# Patient Record
Sex: Female | Born: 1935 | Race: White | Hispanic: No | Marital: Married | Smoking: Never smoker
Health system: Southern US, Community
[De-identification: ages and names within clinical notes are randomized; demographics above are authoritative.]

## PROBLEM LIST (undated history)

## (undated) DIAGNOSIS — I5022 Chronic systolic (congestive) heart failure: Secondary | ICD-10-CM

## (undated) DIAGNOSIS — I5032 Chronic diastolic (congestive) heart failure: Secondary | ICD-10-CM

## (undated) DIAGNOSIS — J811 Chronic pulmonary edema: Secondary | ICD-10-CM

## (undated) DIAGNOSIS — J90 Pleural effusion, not elsewhere classified: Secondary | ICD-10-CM

## (undated) DIAGNOSIS — J849 Interstitial pulmonary disease, unspecified: Secondary | ICD-10-CM

## (undated) DIAGNOSIS — I7300 Raynaud's syndrome without gangrene: Secondary | ICD-10-CM

## (undated) DIAGNOSIS — M1990 Unspecified osteoarthritis, unspecified site: Secondary | ICD-10-CM

## (undated) DIAGNOSIS — J309 Allergic rhinitis, unspecified: Secondary | ICD-10-CM

## (undated) DIAGNOSIS — F0390 Unspecified dementia without behavioral disturbance: Secondary | ICD-10-CM

## (undated) DIAGNOSIS — K7460 Unspecified cirrhosis of liver: Secondary | ICD-10-CM

## (undated) DIAGNOSIS — I10 Essential (primary) hypertension: Secondary | ICD-10-CM

## (undated) DIAGNOSIS — I4891 Unspecified atrial fibrillation: Secondary | ICD-10-CM

## (undated) DIAGNOSIS — K922 Gastrointestinal hemorrhage, unspecified: Secondary | ICD-10-CM

## (undated) DIAGNOSIS — J9600 Acute respiratory failure, unspecified whether with hypoxia or hypercapnia: Secondary | ICD-10-CM

## (undated) DIAGNOSIS — K219 Gastro-esophageal reflux disease without esophagitis: Secondary | ICD-10-CM

## (undated) HISTORY — DX: Chronic systolic (congestive) heart failure: I50.22

## (undated) HISTORY — DX: Pleural effusion, not elsewhere classified: J90

## (undated) HISTORY — DX: Raynaud's syndrome without gangrene: I73.00

## (undated) HISTORY — DX: Interstitial pulmonary disease, unspecified: J84.9

## (undated) HISTORY — DX: Acute respiratory failure, unspecified whether with hypoxia or hypercapnia: J96.00

## (undated) HISTORY — DX: Essential (primary) hypertension: I10

## (undated) HISTORY — DX: Chronic pulmonary edema: J81.1

## (undated) HISTORY — DX: Gastro-esophageal reflux disease without esophagitis: K21.9

## (undated) HISTORY — DX: Chronic diastolic (congestive) heart failure: I50.32

## (undated) HISTORY — DX: Gastrointestinal hemorrhage, unspecified: K92.2

## (undated) HISTORY — DX: Unspecified osteoarthritis, unspecified site: M19.90

## (undated) HISTORY — DX: Allergic rhinitis, unspecified: J30.9

## (undated) HISTORY — PX: ABDOMINAL HYSTERECTOMY: SHX81

## (undated) HISTORY — DX: Unspecified atrial fibrillation: I48.91

## (undated) SURGERY — EGD (ESOPHAGOGASTRODUODENOSCOPY)
Anesthesia: General | Laterality: Left

---

## 2008-02-18 ENCOUNTER — Encounter: Admission: RE | Admit: 2008-02-18 | Discharge: 2008-02-18 | Payer: Self-pay | Admitting: Internal Medicine

## 2008-02-19 ENCOUNTER — Emergency Department (HOSPITAL_COMMUNITY): Admission: EM | Admit: 2008-02-19 | Discharge: 2008-02-19 | Payer: Self-pay | Admitting: Emergency Medicine

## 2008-02-20 ENCOUNTER — Ambulatory Visit: Payer: Self-pay | Admitting: Surgery

## 2008-02-20 ENCOUNTER — Encounter (INDEPENDENT_AMBULATORY_CARE_PROVIDER_SITE_OTHER): Payer: Self-pay | Admitting: Emergency Medicine

## 2008-02-20 ENCOUNTER — Ambulatory Visit (HOSPITAL_COMMUNITY): Admission: RE | Admit: 2008-02-20 | Discharge: 2008-02-20 | Payer: Self-pay | Admitting: Emergency Medicine

## 2008-05-14 ENCOUNTER — Encounter: Admission: RE | Admit: 2008-05-14 | Discharge: 2008-05-14 | Payer: Self-pay | Admitting: Orthopaedic Surgery

## 2008-06-08 ENCOUNTER — Encounter: Admission: RE | Admit: 2008-06-08 | Discharge: 2008-06-08 | Payer: Self-pay | Admitting: Orthopaedic Surgery

## 2008-08-12 ENCOUNTER — Encounter: Admission: RE | Admit: 2008-08-12 | Discharge: 2008-08-12 | Payer: Self-pay | Admitting: Orthopaedic Surgery

## 2008-11-17 ENCOUNTER — Encounter: Admission: RE | Admit: 2008-11-17 | Discharge: 2008-11-17 | Payer: Self-pay | Admitting: Orthopaedic Surgery

## 2009-02-27 ENCOUNTER — Emergency Department (HOSPITAL_COMMUNITY): Admission: EM | Admit: 2009-02-27 | Discharge: 2009-02-27 | Payer: Self-pay | Admitting: Emergency Medicine

## 2010-02-09 ENCOUNTER — Encounter: Admission: RE | Admit: 2010-02-09 | Discharge: 2010-02-09 | Payer: Self-pay | Admitting: Internal Medicine

## 2010-02-27 ENCOUNTER — Encounter: Admission: RE | Admit: 2010-02-27 | Discharge: 2010-02-27 | Payer: Self-pay | Admitting: Internal Medicine

## 2010-08-31 ENCOUNTER — Encounter: Admission: RE | Admit: 2010-08-31 | Discharge: 2010-08-31 | Payer: Self-pay | Admitting: Internal Medicine

## 2011-02-12 ENCOUNTER — Other Ambulatory Visit: Payer: Self-pay | Admitting: Internal Medicine

## 2011-04-03 ENCOUNTER — Other Ambulatory Visit: Payer: Self-pay | Admitting: Internal Medicine

## 2011-04-03 DIAGNOSIS — Z09 Encounter for follow-up examination after completed treatment for conditions other than malignant neoplasm: Secondary | ICD-10-CM

## 2011-07-23 ENCOUNTER — Encounter (INDEPENDENT_AMBULATORY_CARE_PROVIDER_SITE_OTHER): Payer: Medicare PPO | Admitting: Ophthalmology

## 2011-07-23 DIAGNOSIS — H3530 Unspecified macular degeneration: Secondary | ICD-10-CM

## 2011-07-23 DIAGNOSIS — H43819 Vitreous degeneration, unspecified eye: Secondary | ICD-10-CM

## 2011-07-23 DIAGNOSIS — H2510 Age-related nuclear cataract, unspecified eye: Secondary | ICD-10-CM

## 2011-08-21 ENCOUNTER — Other Ambulatory Visit: Payer: Self-pay | Admitting: Neurosurgery

## 2011-08-21 DIAGNOSIS — M545 Low back pain: Secondary | ICD-10-CM

## 2011-08-22 ENCOUNTER — Ambulatory Visit
Admission: RE | Admit: 2011-08-22 | Discharge: 2011-08-22 | Disposition: A | Discharge status: Home / Self Care | Payer: Medicare PPO | Source: Ambulatory Visit | Attending: Neurosurgery | Admitting: Neurosurgery

## 2011-08-22 DIAGNOSIS — M545 Low back pain: Secondary | ICD-10-CM

## 2011-09-12 ENCOUNTER — Other Ambulatory Visit (HOSPITAL_COMMUNITY): Payer: Self-pay | Admitting: Neurosurgery

## 2011-09-12 ENCOUNTER — Encounter (HOSPITAL_COMMUNITY)
Admission: RE | Admit: 2011-09-12 | Discharge: 2011-09-12 | Disposition: A | Discharge status: Home / Self Care | Payer: Medicare PPO | Source: Ambulatory Visit | Attending: Neurosurgery | Admitting: Neurosurgery

## 2011-09-12 DIAGNOSIS — M47816 Spondylosis without myelopathy or radiculopathy, lumbar region: Secondary | ICD-10-CM

## 2011-09-12 LAB — DIFFERENTIAL
Eosinophils Relative: 3 % (ref 0–5)
Lymphocytes Relative: 28 % (ref 12–46)
Lymphs Abs: 2.4 10*3/uL (ref 0.7–4.0)
Monocytes Absolute: 0.7 10*3/uL (ref 0.1–1.0)

## 2011-09-12 LAB — CBC
HCT: 40.8 % (ref 36.0–46.0)
MCV: 86.1 fL (ref 78.0–100.0)
RDW: 13.4 % (ref 11.5–15.5)
WBC: 8.5 10*3/uL (ref 4.0–10.5)

## 2011-09-12 LAB — ABO/RH: ABO/RH(D): B NEG

## 2011-09-12 LAB — TYPE AND SCREEN

## 2011-09-25 ENCOUNTER — Inpatient Hospital Stay (HOSPITAL_COMMUNITY)
Admission: RE | Admit: 2011-09-25 | Discharge: 2011-09-27 | DRG: 455 | Disposition: A | Discharge status: Home / Self Care | Payer: Medicare PPO | Source: Ambulatory Visit | Attending: Neurosurgery | Admitting: Neurosurgery

## 2011-09-25 ENCOUNTER — Inpatient Hospital Stay (HOSPITAL_COMMUNITY): Payer: Medicare PPO

## 2011-09-25 DIAGNOSIS — M4310 Spondylolisthesis, site unspecified: Secondary | ICD-10-CM | POA: Diagnosis present

## 2011-09-25 DIAGNOSIS — M47817 Spondylosis without myelopathy or radiculopathy, lumbosacral region: Principal | ICD-10-CM | POA: Diagnosis present

## 2011-09-25 DIAGNOSIS — I10 Essential (primary) hypertension: Secondary | ICD-10-CM | POA: Diagnosis present

## 2011-09-25 DIAGNOSIS — Z01812 Encounter for preprocedural laboratory examination: Secondary | ICD-10-CM

## 2011-09-25 DIAGNOSIS — Z01810 Encounter for preprocedural cardiovascular examination: Secondary | ICD-10-CM

## 2011-09-25 DIAGNOSIS — Z01818 Encounter for other preprocedural examination: Secondary | ICD-10-CM

## 2011-09-25 HISTORY — PX: BACK SURGERY: SHX140

## 2011-09-25 LAB — BASIC METABOLIC PANEL
CO2: 27 mEq/L (ref 19–32)
Calcium: 9.8 mg/dL (ref 8.4–10.5)
GFR calc Af Amer: 75 mL/min — ABNORMAL LOW (ref >90)
GFR calc non Af Amer: 64 mL/min — ABNORMAL LOW (ref >90)
Sodium: 140 mEq/L (ref 135–145)

## 2011-10-02 NOTE — Op Note (Signed)
NAMEEUTHA, CUDE NO.:  [id number]  MEDICAL RECORD NO.:  [id number]  LOCATION:  3004                         FACILITY:  [facility]  PHYSICIAN:  Kathaleen Maser. Erina Hamme, M.D.    DATE OF BIRTH:  06-04-1929  DATE OF PROCEDURE:  09/25/2011 DATE OF DISCHARGE:                              OPERATIVE REPORT   PREOPERATIVE DIAGNOSES:  L2-3 spondylosis with stenosis and instability. L4-5 grade 1 degenerative spondylolisthesis with severe stenosis.  POSTOPERATIVE DIAGNOSES:  L2-3 spondylosis with stenosis and instability.  L4-5 grade 1 degenerative spondylolisthesis with severe stenosis.  PROCEDURE:  Left L2-3 anterolateral retroperitoneal interbody decompression and fusion utilizing PEEK cage and morselized autograft. L2-3 posterior lumbar fusion with interspinous plate fixation.  L4-5 decompressive laminectomy with bilateral L4 and L5 decompressive foraminotomies, more than be required for simple interbody fusion alone. L4-5 posterior lumbar fusion with tangent interbody allograft wedge, Telamon interbody PEEK cage, and local autografting.  L4-5 posterolateral arthrodesis utilizing nonsegmental pedicle instrumentation and local autografting.  SURGEON:  Kathaleen Maser. Jaymar Loeber, MD  ASSISTANT:  Reinaldo Meeker, MD  ANESTHESIA:  General endotracheal.  INDICATION:  Ms. Lebo is a 77 year old female with history of severe back and lower extremity pain.  Her workup demonstrates evidence of critical stenosis with a grade 1 degenerative spondylolisthesis at L4-5. The patient has progressive disk space breakdown, lateral listhesis, and stenosis at L2-3.  The patient has symptoms roughly to both areas.  She presents now for decompression and fusion at both levels.  OPERATIVE NOTE:  The patient was taken to the operating room and placed on the operating table in supine position.  After adequate level of anesthesia was achieved, the patient was placed in the right lateral decubitus  position and appropriately padded.  The patient's left flank was prepped and draped sterilely.  A linear skin incision was made overlying the L2-3 disk space.  A secondary incision was made posterior and inferior to this.  Using the posterior incision, access was gained to the retroperitoneal space.  Peritoneal contents were swept anteriorly and a dilator was then passed through the flank incision docking down on the L2-3 disk space.  Intraoperative neuro-monitoring was performed, and the approach was free from any adjacent neural structures.  Guidewire was placed, and the track was sequentially dilated.  Self-retaining retractor was placed.  Fluoroscopy confirmed good positioning.  A shim was inserted in the posterior aspect of the disk space.  The operative field was stimulated one last time and found to be free from any neural structures.  Disk space was incised with a 15 blade in a rectangular fashion.  An aggressive diskectomy was then performed using pituitary rongeurs and various curettes.  Contralateral release was performed using Cobb periosteal.  The disk space was sequentially dilated with a trial and a 10-mm lordotic implant was found to be most appropriate for the L2-3 level.  This was packed with Actifuse putty and Osteocel Plus. Protective paddles were placed, and the cage was then impacted into place under fluoroscopic guidance.  This was well-seated in both the anterior and lateral planes.  The inserters were removed.  Once again, fluoroscopy confirmed good positioning of the implant.  Retraction system was removed.  Hemostasis was found to be excellent upon withdrawal of the retractor.  The patient was then repositioned prone. The patient's posterior lumbar region was prepped and draped sterilely. A 10 blade was used to make a linear skin incision extending from L2 down to L5.  Subperiosteal dissection was then performed to expose the lamina facet joints of L2, L3, L4,  and L5 as well as the transverse processes of L4-5.  The interspinous ligament at L2-3 was removed. Posterior lamina and facet joints were decorticated.  Morselized autograft was packed posterolaterally for later fusion.  An interspinous plate from [employer] was then placed over the L2-3 level.  This was then attached and confirmed to be in good position by intraoperative fluoroscopy.  Attention then placed to the L4-5 level.  Decompressive laminectomy was then performed using Leksell rongeurs, Kerrison rongeurs, and high-speed drill to remove the entire lamina of L4, inferior facets of L4, superior facets of L5, and the superior aspect of the L5 lamina.  Ligamentum flavum was then elevated and resected in piecemeal fashion using Kerrison rongeurs.  The underlying thecal sac and exiting L4-L5 nerve roots were identified and widely decompressed bilaterally.  Bilateral diskectomies were then performed at L4-5.  Disk space then sequentially dilated up to 10 mm.  With 10-mm distractor left in the patient's right side,  thecal sac and nerve roots inspected on the left side.  Disk space was then reamed and then cut with 10-mm tangent instrument and soft tissue removed from the interspace.  A 10 x 26 mm tangent wedge was then packed into place and recessed approximately 2 mm from posterior cortical margin.  Distractor was moved to the patient's right side.  Thecal sac and nerve roots were inspected on the right side.  Disk space was again reamed and then cut with 10-mm tangent instrument with soft tissue removed from the interspace. Morselized autograft was packed in interspace and then a 10 x 22 mm Telamon cage packed with morselized autograft was then packed into place and recessed approximately 2 mm from the posterior cortical margin. Pedicles at L4-L5 were then identified using surface landmarks and intraoperative fluoroscopy.  Superficial bone around the pedicle was then removed using  high-speed drill.  Each pedicle was then probed using pedicle awl.  Each pedicle awl track was then tapped with 5.2 mm screw tapper.  Each screw tapper hole was then probed and found to be solidly within bone.  A 5.75 x 45 mm radius screws placed bilaterally at L4, 5.75 x 40 mm screws placed bilaterally at L5.  The spinous processes of L4-L5 were then decorticated using high-speed drill.  This was morselized with autograft, packed posterolaterally for later fusion. Short segment titanium rod placed over the screw heads at L4-L5. Locking caps were placed over screw heads and locking caps were then engaged with good construct under compression.  Final images revealed good position of the bone grafts, hardware at proper operative level and normal alignment of the spine.  Wound was then irrigated with antibiotic solution.  Gelfoam was placed topically for hemostasis and found to be good.  Medium Hemovac drain was left in the epidural space.  Wound was then closed in layers with Vicryl suture.  Steri-Strips and sterile dressings were applied.  There were no operative complications.  The patient tolerated the procedure well, and she returned to the recovery room postoperatively.          ______________________________ Kathaleen Maser Danyele Smejkal, M.D. HAP/MEDQ  D:  09/25/2011  T:  09/26/2011  Job:  [id number]  Electronically Signed by Julio Sicks M.D. on 10/02/2011 12:58:09 PM

## 2011-10-25 ENCOUNTER — Other Ambulatory Visit: Payer: Self-pay | Admitting: Neurosurgery

## 2011-10-25 ENCOUNTER — Ambulatory Visit
Admission: RE | Admit: 2011-10-25 | Discharge: 2011-10-25 | Disposition: A | Discharge status: Home / Self Care | Payer: Medicare PPO | Source: Ambulatory Visit | Attending: Neurosurgery | Admitting: Neurosurgery

## 2011-10-25 DIAGNOSIS — M545 Low back pain: Secondary | ICD-10-CM

## 2011-12-27 ENCOUNTER — Ambulatory Visit
Admission: RE | Admit: 2011-12-27 | Discharge: 2011-12-27 | Disposition: A | Discharge status: Home / Self Care | Payer: Medicare PPO | Source: Ambulatory Visit | Attending: Neurosurgery | Admitting: Neurosurgery

## 2011-12-27 ENCOUNTER — Other Ambulatory Visit: Payer: Self-pay | Admitting: Neurosurgery

## 2011-12-27 DIAGNOSIS — M48061 Spinal stenosis, lumbar region without neurogenic claudication: Secondary | ICD-10-CM

## 2011-12-27 DIAGNOSIS — M4310 Spondylolisthesis, site unspecified: Secondary | ICD-10-CM

## 2012-01-21 ENCOUNTER — Ambulatory Visit (INDEPENDENT_AMBULATORY_CARE_PROVIDER_SITE_OTHER): Payer: Medicare PPO | Admitting: Ophthalmology

## 2012-01-21 DIAGNOSIS — H2510 Age-related nuclear cataract, unspecified eye: Secondary | ICD-10-CM

## 2012-01-21 DIAGNOSIS — I10 Essential (primary) hypertension: Secondary | ICD-10-CM

## 2012-01-21 DIAGNOSIS — H3530 Unspecified macular degeneration: Secondary | ICD-10-CM

## 2012-01-21 DIAGNOSIS — H43819 Vitreous degeneration, unspecified eye: Secondary | ICD-10-CM

## 2012-01-21 DIAGNOSIS — H35379 Puckering of macula, unspecified eye: Secondary | ICD-10-CM

## 2012-01-21 DIAGNOSIS — H35039 Hypertensive retinopathy, unspecified eye: Secondary | ICD-10-CM

## 2012-01-29 ENCOUNTER — Encounter: Payer: Self-pay | Admitting: Family

## 2012-02-06 ENCOUNTER — Encounter: Payer: Self-pay | Admitting: Family

## 2012-02-06 ENCOUNTER — Ambulatory Visit (INDEPENDENT_AMBULATORY_CARE_PROVIDER_SITE_OTHER): Payer: Medicare PPO | Admitting: Family

## 2012-02-06 VITALS — BP 120/70 | Ht 64.5 in | Wt 151.0 lb

## 2012-02-06 DIAGNOSIS — F411 Generalized anxiety disorder: Secondary | ICD-10-CM

## 2012-02-06 DIAGNOSIS — K219 Gastro-esophageal reflux disease without esophagitis: Secondary | ICD-10-CM

## 2012-02-06 DIAGNOSIS — E785 Hyperlipidemia, unspecified: Secondary | ICD-10-CM

## 2012-02-06 DIAGNOSIS — I10 Essential (primary) hypertension: Secondary | ICD-10-CM

## 2012-02-06 DIAGNOSIS — F419 Anxiety disorder, unspecified: Secondary | ICD-10-CM

## 2012-02-06 LAB — LIPID PANEL
HDL: 51.7 mg/dL (ref >39.00)
LDL Cholesterol: 60 mg/dL (ref 0–99)
Total CHOL/HDL Ratio: 3
Triglycerides: 101 mg/dL (ref 0.0–149.0)
VLDL: 20.2 mg/dL (ref 0.0–40.0)

## 2012-02-06 LAB — TSH: TSH: 4.11 u[IU]/mL (ref 0.35–5.50)

## 2012-02-06 LAB — BASIC METABOLIC PANEL
CO2: 28 mEq/L (ref 19–32)
Calcium: 9.1 mg/dL (ref 8.4–10.5)
Sodium: 140 mEq/L (ref 135–145)

## 2012-02-06 LAB — CBC WITH DIFFERENTIAL/PLATELET
Basophils Relative: 0.7 % (ref 0.0–3.0)
Eosinophils Relative: 2.5 % (ref 0.0–5.0)
Hemoglobin: 10.8 g/dL — ABNORMAL LOW (ref 12.0–15.0)
Lymphocytes Relative: 28.7 % (ref 12.0–46.0)
MCHC: 32.1 g/dL (ref 30.0–36.0)
Monocytes Relative: 8.3 % (ref 3.0–12.0)
Neutro Abs: 4.5 10*3/uL (ref 1.4–7.7)
RBC: 4.39 Mil/uL (ref 3.87–5.11)
WBC: 7.5 10*3/uL (ref 4.5–10.5)

## 2012-02-06 NOTE — Patient Instructions (Signed)

## 2012-02-06 NOTE — Progress Notes (Signed)
  Subjective:    Patient ID: Tiffany [name], female    DOB: 1929/08/23, 78 y.o.   MRN: [id number]  HPI 78 year old white female, nonsmoker, new patient to the practice and to be established. She has a past medical history of GERD, hypertension, anxiety and hyperlipidemia. She is stable on her current medications. Doesn't routinely exercise or follow any particular diet. She has declined mammograms and colonoscopies screenings.  Patient does report a past medical history of osteoarthritis in her hands bilaterally characterized by periodic episodes of aching. She does not take anything for her symptoms. And declines any medication therapy.   Review of Systems  Constitutional: Negative.   HENT: Negative.   Eyes: Negative.   Respiratory: Negative.   Cardiovascular: Negative.   Gastrointestinal: Negative.   Genitourinary: Negative.   Musculoskeletal: Negative.   Skin: Negative.   Neurological: Negative.   Hematological: Negative.   Psychiatric/Behavioral: Negative.    Past Medical History  Diagnosis Date  . Hypertension     History   Social History  . Marital Status: Married    Spouse Name: N/A    Number of Children: N/A  . Years of Education: N/A   Occupational History  . Not on file.   Social History Main Topics  . Smoking status: Never Smoker   . Smokeless tobacco: Not on file  . Alcohol Use: Yes     glass of wine per day  . Drug Use: No  . Sexually Active: Not on file   Other Topics Concern  . Not on file   Social History Narrative  . No narrative on file    Past Surgical History  Procedure Date  . Back surgery 09/25/2011  . Abdominal hysterectomy     No family history on file.  No Known Allergies  No current outpatient prescriptions on file prior to visit.    BP 120/70  Ht 5' 4.5" (1.638 m)  Wt 151 lb (68.493 kg)  BMI 25.52 kg/m2    Objective:   Physical Exam  Constitutional: She is oriented to person, place, and time. She appears  well-developed and well-nourished.  HENT:  Right Ear: External ear normal.  Nose: Nose normal.  Mouth/Throat: Oropharynx is clear and moist.  Eyes: Pupils are equal, round, and reactive to light.  Neck: Normal range of motion. Neck supple.  Cardiovascular: Normal rate, regular rhythm and normal heart sounds.   Pulmonary/Chest: Effort normal and breath sounds normal.  Abdominal: Soft. Bowel sounds are normal.  Musculoskeletal: Normal range of motion.  Neurological: She is alert and oriented to person, place, and time.  Skin: Skin is warm and dry.  Psychiatric: She has a normal mood and affect.          Assessment & Plan:  Assessment: Osteoarthritis, GERD, hypertension, anxiety, and hyperlipidemia  Plan: Labs since include BMP, CBC, TSH and lipids. Will notify patient of the results herself a diet and exercise. Self breast exams. Exercise 30 minutes daily. We'll bring patient back for recheck in about 6 months. Will recheck patient sooner when necessary

## 2012-02-07 ENCOUNTER — Telehealth: Payer: Self-pay | Admitting: Family

## 2012-02-07 NOTE — Telephone Encounter (Signed)
ALPRAZolam (XANAX) 0.5 MG tablet , hydrochlorothiazide (HYDRODIURIL) 12.5 MG ,lisinopril (PRINIVIL,ZESTRIL) 20 MG tablet, omeprazole (PRILOSEC) 20 MG capsule, pravastatin (PRAVACHOL) 20 MG tablet to [facility].

## 2012-02-08 MED ORDER — HYDROCHLOROTHIAZIDE 12.5 MG PO TABS: 12.5000 mg | ORAL_TABLET | Freq: Every day | ORAL | Status: DC

## 2012-02-08 MED ORDER — OMEPRAZOLE 20 MG PO CPDR: 20.0000 mg | DELAYED_RELEASE_CAPSULE | Freq: Every day | ORAL | Status: DC

## 2012-02-08 MED ORDER — PRAVASTATIN SODIUM 20 MG PO TABS: 20.0000 mg | ORAL_TABLET | Freq: Every day | ORAL | Status: DC

## 2012-02-08 MED ORDER — LISINOPRIL 20 MG PO TABS: 20.0000 mg | ORAL_TABLET | Freq: Every day | ORAL | Status: DC

## 2012-02-08 MED ORDER — ALPRAZOLAM 0.5 MG PO TABS: 0.5000 mg | ORAL_TABLET | Freq: Every day | ORAL | Status: DC

## 2012-02-08 NOTE — Telephone Encounter (Signed)
Rxs faxed to [facility] at [phone]

## 2012-04-16 ENCOUNTER — Other Ambulatory Visit: Payer: Self-pay | Admitting: Family

## 2012-04-16 MED ORDER — ALPRAZOLAM 0.5 MG PO TABS: 0.5000 mg | ORAL_TABLET | Freq: Every day | ORAL | Status: DC

## 2012-07-21 ENCOUNTER — Ambulatory Visit (INDEPENDENT_AMBULATORY_CARE_PROVIDER_SITE_OTHER): Payer: Medicare PPO | Admitting: Ophthalmology

## 2012-08-06 ENCOUNTER — Ambulatory Visit (INDEPENDENT_AMBULATORY_CARE_PROVIDER_SITE_OTHER): Payer: Medicare PPO | Admitting: Family

## 2012-08-06 ENCOUNTER — Other Ambulatory Visit: Payer: Self-pay | Admitting: Family

## 2012-08-06 ENCOUNTER — Encounter: Payer: Self-pay | Admitting: Family

## 2012-08-06 VITALS — BP 120/82 | HR 91 | Temp 97.9°F | Wt 146.0 lb

## 2012-08-06 DIAGNOSIS — E785 Hyperlipidemia, unspecified: Secondary | ICD-10-CM

## 2012-08-06 DIAGNOSIS — I10 Essential (primary) hypertension: Secondary | ICD-10-CM

## 2012-08-06 DIAGNOSIS — R413 Other amnesia: Secondary | ICD-10-CM

## 2012-08-06 DIAGNOSIS — F419 Anxiety disorder, unspecified: Secondary | ICD-10-CM

## 2012-08-06 DIAGNOSIS — F411 Generalized anxiety disorder: Secondary | ICD-10-CM

## 2012-08-06 DIAGNOSIS — J309 Allergic rhinitis, unspecified: Secondary | ICD-10-CM

## 2012-08-06 DIAGNOSIS — E875 Hyperkalemia: Secondary | ICD-10-CM

## 2012-08-06 LAB — BASIC METABOLIC PANEL
BUN: 26 mg/dL — ABNORMAL HIGH (ref 6–23)
CO2: 27 mEq/L (ref 19–32)
Calcium: 9.4 mg/dL (ref 8.4–10.5)
Creatinine, Ser: 1.1 mg/dL (ref 0.4–1.2)

## 2012-08-06 LAB — CBC WITH DIFFERENTIAL/PLATELET
Basophils Relative: 0.5 % (ref 0.0–3.0)
Eosinophils Absolute: 0.1 10*3/uL (ref 0.0–0.7)
Lymphocytes Relative: 24.4 % (ref 12.0–46.0)
MCHC: 31.5 g/dL (ref 30.0–36.0)
MCV: 80.9 fl (ref 78.0–100.0)
Monocytes Absolute: 0.6 10*3/uL (ref 0.1–1.0)
Neutrophils Relative %: 64.5 % (ref 43.0–77.0)
Platelets: 104 10*3/uL — ABNORMAL LOW (ref 150.0–400.0)
RBC: 4.55 Mil/uL (ref 3.87–5.11)
WBC: 6.4 10*3/uL (ref 4.5–10.5)

## 2012-08-06 LAB — VITAMIN B12: Vitamin B-12: 318 pg/mL (ref 211–911)

## 2012-08-06 LAB — LIPID PANEL
LDL Cholesterol: 53 mg/dL (ref 0–99)
VLDL: 24 mg/dL (ref 0.0–40.0)

## 2012-08-06 LAB — TSH: TSH: 3.89 u[IU]/mL (ref 0.35–5.50)

## 2012-08-06 MED ORDER — HYDROCHLOROTHIAZIDE 12.5 MG PO TABS: 12.5000 mg | ORAL_TABLET | Freq: Every day | ORAL | Status: DC

## 2012-08-06 MED ORDER — LORATADINE 10 MG PO TABS: 10.0000 mg | ORAL_TABLET | Freq: Every day | ORAL | Status: DC

## 2012-08-06 MED ORDER — OMEPRAZOLE 20 MG PO CPDR: 20.0000 mg | DELAYED_RELEASE_CAPSULE | Freq: Every day | ORAL | Status: DC

## 2012-08-06 MED ORDER — MELOXICAM 7.5 MG PO TABS: 7.5000 mg | ORAL_TABLET | Freq: Every day | ORAL | Status: DC

## 2012-08-06 MED ORDER — LISINOPRIL 20 MG PO TABS: 20.0000 mg | ORAL_TABLET | Freq: Every day | ORAL | Status: DC

## 2012-08-06 MED ORDER — PRAVASTATIN SODIUM 20 MG PO TABS: 20.0000 mg | ORAL_TABLET | Freq: Every day | ORAL | Status: DC

## 2012-08-06 MED ORDER — ALPRAZOLAM 0.5 MG PO TABS: 0.5000 mg | ORAL_TABLET | Freq: Every day | ORAL | Status: DC

## 2012-08-06 NOTE — Progress Notes (Signed)
Subjective:    Patient ID: Tiffany [name], female    DOB: [birth year], 78 y.o.   MRN: [id number]  HPI 78 year old female in for a recheck of Hypertension, Anxiety, GERD, and Hyperlipidemia. She is currently stable on all of there medications. Has concerns of arthritis worsening in her feet that she occasionally take Ibuprofen. Pain is a 3/10 that is worse in the mornings. Described as achy.   Review of Systems  Constitutional: Negative.   HENT: Negative.   Eyes: Negative.   Respiratory: Negative.   Cardiovascular: Negative.   Gastrointestinal: Negative.   Genitourinary: Negative.   Musculoskeletal: Negative.   Neurological: Negative.   Hematological: Negative.   Psychiatric/Behavioral: Negative.    Past Medical History  Diagnosis Date  . Hypertension     History   Social History  . Marital Status: Married    Spouse Name: N/A    Number of Children: N/A  . Years of Education: N/A   Occupational History  . Not on file.   Social History Main Topics  . Smoking status: Never Smoker   . Smokeless tobacco: Not on file  . Alcohol Use: Yes     glass of wine per day  . Drug Use: No  . Sexually Active: Not on file   Other Topics Concern  . Not on file   Social History Narrative  . No narrative on file    Past Surgical History  Procedure Date  . Back surgery 09/25/2011  . Abdominal hysterectomy     No family history on file.  No Known Allergies  Current Outpatient Prescriptions on File Prior to Visit  Medication Sig Dispense Refill  . aspirin 81 MG tablet Take 81 mg by mouth daily.      . benzonatate (TESSALON) 200 MG capsule       . CRANBERRY PO Take 3,600 mg by mouth daily.      Marland Kitchen ipratropium (ATROVENT) 0.03 % nasal spray       . DISCONTD: hydrochlorothiazide (HYDRODIURIL) 12.5 MG tablet Take 1 tablet (12.5 mg total) by mouth daily.  90 tablet  1  . DISCONTD: lisinopril (PRINIVIL,ZESTRIL) 20 MG tablet Take 1 tablet (20 mg total) by mouth daily.  90 tablet   1  . DISCONTD: loratadine (CLARITIN) 10 MG tablet Take 10 mg by mouth daily.      Marland Kitchen DISCONTD: omeprazole (PRILOSEC) 20 MG capsule Take 1 capsule (20 mg total) by mouth daily.  90 capsule  1  . DISCONTD: pravastatin (PRAVACHOL) 20 MG tablet Take 1 tablet (20 mg total) by mouth daily.  90 tablet  1    BP 120/82  Pulse 91  Temp 97.9 F (36.6 C) (Oral)  Wt 146 lb (66.225 kg)  SpO2 95%chart    Objective:   Physical Exam  Constitutional: She is oriented to person, place, and time. She appears well-developed and well-nourished.  HENT:  Right Ear: External ear normal.  Left Ear: External ear normal.  Nose: Nose normal.  Mouth/Throat: Oropharynx is clear and moist.  Neck: Normal range of motion. Neck supple.  Cardiovascular: Normal rate, regular rhythm and normal heart sounds.   Pulmonary/Chest: Effort normal and breath sounds normal.  Abdominal: Soft. Bowel sounds are normal.  Musculoskeletal: Normal range of motion.  Neurological: She is alert and oriented to person, place, and time.  Skin: Skin is warm and dry.  Psychiatric: She has a normal mood and affect.          Assessment & Plan:  Assessment: Hypertension, Anxiety, GERD, Hyperlipidemia, Osteorthritis  Plan: Labs sent. Encouraged her to continue healthy diet and exercise. Prescriptions renewed. Call the office with any questions or concerns. Recheck in 4-6 months and sooner as needed.

## 2012-08-07 NOTE — Progress Notes (Signed)
Quick Note:  Called and spoke with pt about lab results. ______ 

## 2012-08-20 ENCOUNTER — Ambulatory Visit (INDEPENDENT_AMBULATORY_CARE_PROVIDER_SITE_OTHER): Payer: Medicare PPO | Admitting: Family

## 2012-08-20 DIAGNOSIS — Z23 Encounter for immunization: Secondary | ICD-10-CM

## 2012-08-28 ENCOUNTER — Other Ambulatory Visit: Payer: Medicare PPO

## 2012-08-28 DIAGNOSIS — E875 Hyperkalemia: Secondary | ICD-10-CM

## 2012-08-28 LAB — BASIC METABOLIC PANEL
GFR: 45.84 mL/min — ABNORMAL LOW (ref >60.00)
Potassium: 5.3 mEq/L — ABNORMAL HIGH (ref 3.5–5.1)
Sodium: 139 mEq/L (ref 135–145)

## 2012-09-01 ENCOUNTER — Telehealth: Payer: Self-pay | Admitting: Family

## 2012-09-01 NOTE — Telephone Encounter (Signed)
Pt called and lft a vm req that she get lab results re: enzyme lvls. Req call back from nurse.

## 2012-09-01 NOTE — Telephone Encounter (Signed)
Left message to explain what stable labs mean and advised pt to call back with questions or concerns

## 2012-12-29 ENCOUNTER — Other Ambulatory Visit: Payer: Self-pay | Admitting: Family

## 2013-01-19 ENCOUNTER — Encounter: Payer: Self-pay | Admitting: Family

## 2013-01-19 ENCOUNTER — Ambulatory Visit (INDEPENDENT_AMBULATORY_CARE_PROVIDER_SITE_OTHER): Payer: Medicare PPO | Admitting: Family

## 2013-01-19 ENCOUNTER — Telehealth: Payer: Self-pay | Admitting: Family

## 2013-01-19 VITALS — BP 120/72 | HR 88 | Wt 150.4 lb

## 2013-01-19 DIAGNOSIS — E785 Hyperlipidemia, unspecified: Secondary | ICD-10-CM | POA: Insufficient documentation

## 2013-01-19 DIAGNOSIS — I7300 Raynaud's syndrome without gangrene: Secondary | ICD-10-CM

## 2013-01-19 DIAGNOSIS — M1710 Unilateral primary osteoarthritis, unspecified knee: Secondary | ICD-10-CM | POA: Insufficient documentation

## 2013-01-19 DIAGNOSIS — I10 Essential (primary) hypertension: Secondary | ICD-10-CM

## 2013-01-19 DIAGNOSIS — F411 Generalized anxiety disorder: Secondary | ICD-10-CM | POA: Insufficient documentation

## 2013-01-19 DIAGNOSIS — J309 Allergic rhinitis, unspecified: Secondary | ICD-10-CM | POA: Insufficient documentation

## 2013-01-19 LAB — LIPID PANEL
HDL: 46.1 mg/dL (ref >39.00)
LDL Cholesterol: 106 mg/dL — ABNORMAL HIGH (ref 0–99)
Total CHOL/HDL Ratio: 4
Triglycerides: 151 mg/dL — ABNORMAL HIGH (ref 0.0–149.0)

## 2013-01-19 LAB — CBC WITH DIFFERENTIAL/PLATELET
Eosinophils Relative: 2.9 % (ref 0.0–5.0)
HCT: 35.8 % — ABNORMAL LOW (ref 36.0–46.0)
Hemoglobin: 11.5 g/dL — ABNORMAL LOW (ref 12.0–15.0)
Lymphocytes Relative: 28.1 % (ref 12.0–46.0)
Lymphs Abs: 1.9 10*3/uL (ref 0.7–4.0)
Monocytes Relative: 8 % (ref 3.0–12.0)
Platelets: 122 10*3/uL — ABNORMAL LOW (ref 150.0–400.0)
WBC: 6.9 10*3/uL (ref 4.5–10.5)

## 2013-01-19 LAB — BASIC METABOLIC PANEL
CO2: 29 mEq/L (ref 19–32)
Chloride: 106 mEq/L (ref 96–112)
Potassium: 5.3 mEq/L — ABNORMAL HIGH (ref 3.5–5.1)
Sodium: 142 mEq/L (ref 135–145)

## 2013-01-19 LAB — HEPATIC FUNCTION PANEL
AST: 22 U/L (ref 0–37)
Albumin: 3.9 g/dL (ref 3.5–5.2)

## 2013-01-19 NOTE — Telephone Encounter (Signed)
Pt had a question about taking vitamin e for memory because she has heard a lot about it. She states that her memory isn't what it used to be but that she is 79 yrs old. I advised pt that she absolutely correct in that she is entitled to a little memory loss every now and again but that she can take a one a day multivitamin if she so pleases. Pt verbalized understanding

## 2013-01-19 NOTE — Patient Instructions (Signed)
Raynaud's Syndrome  Raynaud's Syndrome is a disorder of the blood vessels in your hands and feet. It occurs when small arteries of the arms/hands or legs/feet become sensitive to cold or emotional upset. This causes the arteries to constrict, or narrow, and reduces blood flow to the area. The color in the fingers or toes changes from white to bluish to red and this is not usually painful. There may be numbness and tingling. Sores on the skin (ulcers) can form. Symptoms are usually relieved by warming.  HOME CARE INSTRUCTIONS     Avoid exposure to cold. Keep your whole body warm and dry. Dress in layers. Wear mittens or gloves when handling ice or frozen food and when outdoors. Use holders for glasses or cans containing cold drinks. If possible, stay indoors during cold weather.   Limit your use of caffeine. Switch to decaffeinated coffee, tea, and soda pop. Avoid chocolate.   Avoid smoking or being around cigarette smoke. Smoke will make symptoms worse.   Wear loose fitting socks and comfortable, roomy shoes.   Avoid vibrating tools and machinery.   If possible, avoid stressful and emotional situations. Exercise, meditation and yoga may help you cope with stress. Biofeedback may be useful.   Ask your caregiver about medicine (calcium channel blockers) that may control Raynaud's phenomena.  SEEK MEDICAL CARE IF:     Your discomfort becomes worse, despite conservative treatment.   You develop sores on your fingers and toes that do not heal.  Document Released: 11/23/2000 Document Revised: 02/18/2012 Document Reviewed: 11/30/2008  [employer] Patient Information 2013 [employer].

## 2013-01-19 NOTE — Progress Notes (Signed)
Subjective:    Patient ID: Tiffany [name], female    DOB: July 05, 1929, 79 y.o.   MRN: [id number]  HPI 79 year old female, nonsmoker is in for a recheck of hypertension, hyperlipidemia, anxiety, allergic rhinitis, GERD, and osteoarthritis. She's currently doing well on all her medications. Has complaints of achy, blue fingertips that occur particularly with the cold weather. It's better when she warms her hands. It's been ongoing x2 months. Denies any numbness or tingling.   Review of Systems  Constitutional: Negative.   HENT: Negative.   Eyes: Negative.   Respiratory: Negative.   Cardiovascular: Negative.   Gastrointestinal: Negative.   Endocrine: Negative.   Genitourinary: Negative.   Musculoskeletal: Negative.   Skin: Negative.   Neurological: Negative.   Hematological: Negative.   Psychiatric/Behavioral: Negative.    Past Medical History  Diagnosis Date  . Hypertension     History   Social History  . Marital Status: Married    Spouse Name: N/A    Number of Children: N/A  . Years of Education: N/A   Occupational History  . Not on file.   Social History Main Topics  . Smoking status: Never Smoker   . Smokeless tobacco: Not on file  . Alcohol Use: Yes     Comment: glass of wine per day  . Drug Use: No  . Sexually Active: Not on file   Other Topics Concern  . Not on file   Social History Narrative  . No narrative on file    Past Surgical History  Procedure Laterality Date  . Back surgery  09/25/2011  . Abdominal hysterectomy      No family history on file.  No Known Allergies  Current Outpatient Prescriptions on File Prior to Visit  Medication Sig Dispense Refill  . ALPRAZolam (XANAX) 0.5 MG tablet Take 1 tablet (0.5 mg total) by mouth daily.  90 tablet  0  . aspirin 81 MG tablet Take 81 mg by mouth daily.      Marland Kitchen CRANBERRY PO Take 3,600 mg by mouth daily.      . hydrochlorothiazide (HYDRODIURIL) 12.5 MG tablet TAKE 1 TABLET EVERY DAY  90 tablet   PRN  . ipratropium (ATROVENT) 0.03 % nasal spray       . lisinopril (PRINIVIL,ZESTRIL) 20 MG tablet TAKE 1 TABLET EVERY DAY  90 tablet  PRN  . loratadine (CLARITIN) 10 MG tablet Take 1 tablet (10 mg total) by mouth daily.  90 tablet  1  . meloxicam (MOBIC) 7.5 MG tablet Take 1 tablet (7.5 mg total) by mouth daily.  90 tablet  0  . omeprazole (PRILOSEC) 20 MG capsule TAKE 1 CAPSULE EVERY DAY  90 capsule  PRN  . pravastatin (PRAVACHOL) 20 MG tablet Take 1 tablet (20 mg total) by mouth daily.  90 tablet  1  . benzonatate (TESSALON) 200 MG capsule        No current facility-administered medications on file prior to visit.    BP 120/72  Pulse 88  Wt 150 lb 6.4 oz (68.221 kg)  BMI 25.43 kg/m2  SpO2 96%chart    Objective:   Physical Exam  Constitutional: She is oriented to person, place, and time. She appears well-developed and well-nourished.  HENT:  Right Ear: External ear normal.  Left Ear: External ear normal.  Nose: Nose normal.  Mouth/Throat: Oropharynx is clear and moist.  Neck: Normal range of motion. Neck supple. No thyromegaly present.  Cardiovascular: Normal rate, regular rhythm and normal heart sounds.  Pulmonary/Chest: Effort normal and breath sounds normal.  Abdominal: Soft. Bowel sounds are normal.  Musculoskeletal: Normal range of motion.  Neurological: She is alert and oriented to person, place, and time. She has normal reflexes.  Skin: Skin is warm and dry.  Psychiatric: She has a normal mood and affect.          Assessment & Plan:  Assessment: 1. Anxiety 2. Osteoarthritis 3. Hypertension 4. Hypercholesterolemia 5. Allergic rhinitis 6. Raynaud Syndrome  Plan: Discussed Raynaud's syndrome in depth and advised her to wear gloves when it's cold outside. Lab sent to include BMP, CBC, lipids, LFTs and the patient and the results. Continue current medications. Patient is currently not taken her pravastatin, therefore see where her cholesterol is to decide  whether or not she can remain off of it. Recheck in 6 months and sooner as needed.

## 2013-01-19 NOTE — Telephone Encounter (Signed)
Pt would like for you to call her in reference to appt this am.

## 2013-01-27 ENCOUNTER — Other Ambulatory Visit: Payer: Self-pay | Admitting: Family

## 2013-02-02 ENCOUNTER — Other Ambulatory Visit: Payer: Self-pay

## 2013-02-02 MED ORDER — ALPRAZOLAM 0.5 MG PO TABS: 0.5000 mg | ORAL_TABLET | Freq: Every day | ORAL | Status: DC

## 2013-04-13 ENCOUNTER — Encounter: Payer: Self-pay | Admitting: Family

## 2013-04-13 ENCOUNTER — Ambulatory Visit (INDEPENDENT_AMBULATORY_CARE_PROVIDER_SITE_OTHER): Payer: Medicare PPO | Admitting: Family

## 2013-04-13 VITALS — BP 118/74 | HR 87 | Wt 148.0 lb

## 2013-04-13 DIAGNOSIS — M19079 Primary osteoarthritis, unspecified ankle and foot: Secondary | ICD-10-CM

## 2013-04-13 DIAGNOSIS — M7750 Other enthesopathy of unspecified foot: Secondary | ICD-10-CM

## 2013-04-13 DIAGNOSIS — M19071 Primary osteoarthritis, right ankle and foot: Secondary | ICD-10-CM

## 2013-04-13 DIAGNOSIS — M25774 Osteophyte, right foot: Secondary | ICD-10-CM

## 2013-04-13 DIAGNOSIS — M25775 Osteophyte, left foot: Secondary | ICD-10-CM

## 2013-04-13 NOTE — Progress Notes (Signed)
Subjective:    Patient ID: Tiffany [name], female    DOB: [birth year], 79 y.o.   MRN: [id number]  HPI 79 year old white female, nonsmoker is in with complaints of bony growths to both of her feet that have enlarged over the last couple years. She denies any pain. Denies any redness or tenderness. She has a history of osteoarthritis. She takes Mobic 7-1/2 mg once a day and tolerated well.   Review of Systems  Constitutional: Negative.   Respiratory: Negative.   Cardiovascular: Negative.   Musculoskeletal: Positive for arthralgias. Negative for myalgias and back pain.       Bony growth to both feet.  Skin: Negative.   Neurological: Negative.   Psychiatric/Behavioral: Negative.    Past Medical History  Diagnosis Date  . Hypertension     History   Social History  . Marital Status: Married    Spouse Name: N/A    Number of Children: N/A  . Years of Education: N/A   Occupational History  . Not on file.   Social History Main Topics  . Smoking status: Never Smoker   . Smokeless tobacco: Not on file  . Alcohol Use: Yes     Comment: glass of wine per day  . Drug Use: No  . Sexually Active: Not on file   Other Topics Concern  . Not on file   Social History Narrative  . No narrative on file    Past Surgical History  Procedure Laterality Date  . Back surgery  09/25/2011  . Abdominal hysterectomy      No family history on file.  No Known Allergies  Current Outpatient Prescriptions on File Prior to Visit  Medication Sig Dispense Refill  . ALPRAZolam (XANAX) 0.5 MG tablet Take 1 tablet (0.5 mg total) by mouth daily.  90 tablet  2  . aspirin 81 MG tablet Take 81 mg by mouth daily.      Marland Kitchen CRANBERRY PO Take 3,600 mg by mouth daily.      . hydrochlorothiazide (HYDRODIURIL) 12.5 MG tablet TAKE 1 TABLET EVERY DAY  90 tablet  PRN  . ipratropium (ATROVENT) 0.03 % nasal spray       . lisinopril (PRINIVIL,ZESTRIL) 20 MG tablet TAKE 1 TABLET EVERY DAY  90 tablet  PRN  .  loratadine (CLARITIN) 10 MG tablet Take 1 tablet (10 mg total) by mouth daily.  90 tablet  1  . meloxicam (MOBIC) 7.5 MG tablet TAKE 1 TABLET EVERY DAY  90 tablet  PRN  . omeprazole (PRILOSEC) 20 MG capsule TAKE 1 CAPSULE EVERY DAY  90 capsule  PRN  . benzonatate (TESSALON) 200 MG capsule       . pravastatin (PRAVACHOL) 20 MG tablet Take 1 tablet (20 mg total) by mouth daily.  90 tablet  1   No current facility-administered medications on file prior to visit.    BP 118/74  Pulse 87  Wt 148 lb (67.132 kg)  BMI 25.02 kg/m2  SpO2 96%chart    Objective:   Physical Exam  Constitutional: She is oriented to person, place, and time. She appears well-developed and well-nourished.  Neck: Normal range of motion. Neck supple.  Cardiovascular: Normal rate and normal heart sounds.   Pulmonary/Chest: Effort normal and breath sounds normal.  Musculoskeletal: Normal range of motion.       Feet:  Osteophytes noted to the feet bilaterally. Nontender.   Neurological: She is alert and oriented to person, place, and time.  Skin: Skin is warm and  dry.  Psychiatric: She has a normal mood and affect.          Assessment & Plan:  Assessment: 1. Osteoarthritis 2. Osteophytes   Plan: Patient is declining a referral to podiatry. No xray necessary. Continue mobic. Call the office with any questions or concerns.

## 2013-04-13 NOTE — Patient Instructions (Addendum)
Osteoarthritis Osteoarthritis is the most common form of arthritis. It is redness, soreness, and swelling (inflammation) affecting the cartilage. Cartilage acts as a cushion, covering the ends of bones where they meet to form a joint. CAUSES  Over time, the cartilage begins to wear away. This causes bone to rub on bone. This produces pain and stiffness in the affected joints. Factors that contribute to this problem are:  Excessive body weight.  Age.  Overuse of joints. SYMPTOMS   People with osteoarthritis usually experience joint pain, swelling, or stiffness.  Over time, the joint may lose its normal shape.  Small deposits of bone (osteophytes) may grow on the edges of the joint.  Bits of bone or cartilage can break off and float inside the joint space. This may cause more pain and damage.  Osteoarthritis can lead to depression, anxiety, feelings of helplessness, and limitations on daily activities. The most commonly affected joints are in the:  Ends of the fingers.  Thumbs.  Neck.  Lower back.  Knees.  Hips. DIAGNOSIS  Diagnosis is mostly based on your symptoms and exam. Tests may be helpful, including:  X-rays of the affected joint.  A computerized magnetic scan (MRI).  Blood tests to rule out other types of arthritis.  Joint fluid tests. This involves using a needle to draw fluid from the joint and examining the fluid under a microscope. TREATMENT  Goals of treatment are to control pain, improve joint function, maintain a normal body weight, and maintain a healthy lifestyle. Treatment approaches may include:  A prescribed exercise program with rest and joint relief.  Weight control with nutritional education.  Pain relief techniques such as:  Properly applied heat and cold.  Electric pulses delivered to nerve endings under the skin (transcutaneous electrical nerve stimulation, TENS).  Massage.  Certain supplements. Ask your caregiver before using any  supplements, especially in combination with prescribed drugs.  Medicines to control pain, such as:  Acetaminophen.  Nonsteroidal anti-inflammatory drugs (NSAIDs), such as naproxen.  Narcotic or central-acting agents, such as tramadol. This drug carries a risk of addiction and is generally prescribed for short-term use.  Corticosteroids. These can be given orally or as injection. This is a short-term treatment, not recommended for routine use.  Surgery to reposition the bones and relieve pain (osteotomy) or to remove loose pieces of bone and cartilage. Joint replacement may be needed in advanced states of osteoarthritis. HOME CARE INSTRUCTIONS  Your caregiver can recommend specific types of exercise. These may include:  Strengthening exercises. These are done to strengthen the muscles that support joints affected by arthritis. They can be performed with weights or with exercise bands to add resistance.  Aerobic activities. These are exercises, such as brisk walking or low-impact aerobics, that get your heart pumping. They can help keep your lungs and circulatory system in shape.  Range-of-motion activities. These keep your joints limber.  Balance and agility exercises. These help you maintain daily living skills. Learning about your condition and being actively involved in your care will help improve the course of your osteoarthritis. SEEK MEDICAL CARE IF:   You feel hot or your skin turns red.  You develop a rash in addition to your joint pain.  You have an oral temperature above 102 F (38.9 C). FOR MORE INFORMATION  National Institute of Arthritis and Musculoskeletal and Skin Diseases: www.niams.nih.gov National Institute on Aging: www.nia.nih.gov American College of Rheumatology: www.rheumatology.org Document Released: 11/26/2005 Document Revised: 02/18/2012 Document Reviewed: 03/09/2010 ExitCare Patient Information 2013 [employer].  

## 2013-06-05 ENCOUNTER — Other Ambulatory Visit: Payer: Self-pay | Admitting: Family

## 2013-06-05 MED ORDER — METHYLPREDNISOLONE 4 MG PO KIT: PACK | ORAL | Status: AC

## 2013-06-09 ENCOUNTER — Ambulatory Visit (INDEPENDENT_AMBULATORY_CARE_PROVIDER_SITE_OTHER)
Admission: RE | Admit: 2013-06-09 | Discharge: 2013-06-09 | Disposition: A | Discharge status: Home / Self Care | Payer: Medicare PPO | Source: Ambulatory Visit | Attending: Family | Admitting: Family

## 2013-06-09 ENCOUNTER — Other Ambulatory Visit: Payer: Self-pay

## 2013-06-09 ENCOUNTER — Ambulatory Visit (INDEPENDENT_AMBULATORY_CARE_PROVIDER_SITE_OTHER): Payer: Medicare PPO | Admitting: Family

## 2013-06-09 ENCOUNTER — Telehealth: Payer: Self-pay | Admitting: Family

## 2013-06-09 ENCOUNTER — Encounter: Payer: Self-pay | Admitting: Family

## 2013-06-09 VITALS — BP 122/72 | HR 96 | Wt 146.0 lb

## 2013-06-09 DIAGNOSIS — M25551 Pain in right hip: Secondary | ICD-10-CM

## 2013-06-09 DIAGNOSIS — M25559 Pain in unspecified hip: Secondary | ICD-10-CM

## 2013-06-09 DIAGNOSIS — M47817 Spondylosis without myelopathy or radiculopathy, lumbosacral region: Secondary | ICD-10-CM

## 2013-06-09 DIAGNOSIS — M47816 Spondylosis without myelopathy or radiculopathy, lumbar region: Secondary | ICD-10-CM

## 2013-06-09 DIAGNOSIS — M5416 Radiculopathy, lumbar region: Secondary | ICD-10-CM

## 2013-06-09 DIAGNOSIS — IMO0002 Reserved for concepts with insufficient information to code with codable children: Secondary | ICD-10-CM

## 2013-06-09 MED ORDER — HYDROCODONE-ACETAMINOPHEN 10-325 MG PO TABS: 0.5000 | ORAL_TABLET | Freq: Three times a day (TID) | ORAL | Status: DC | PRN

## 2013-06-09 NOTE — Patient Instructions (Addendum)
Sciatica °Sciatica is pain, weakness, numbness, or tingling along the path of the sciatic nerve. The nerve starts in the lower back and runs down the back of each leg. The nerve controls the muscles in the lower leg and in the back of the knee, while also providing sensation to the back of the thigh, lower leg, and the sole of your foot. Sciatica is a symptom of another medical condition. For instance, nerve damage or certain conditions, such as a herniated disk or bone spur on the spine, pinch or put pressure on the sciatic nerve. This causes the pain, weakness, or other sensations normally associated with sciatica. Generally, sciatica only affects one side of the body. °CAUSES  °· Herniated or slipped disc. °· Degenerative disk disease. °· A pain disorder involving the narrow muscle in the buttocks (piriformis syndrome). °· Pelvic injury or fracture. °· Pregnancy. °· Tumor (rare). °SYMPTOMS  °Symptoms can vary from mild to very severe. The symptoms usually travel from the low back to the buttocks and down the back of the leg. Symptoms can include: °· Mild tingling or dull aches in the lower back, leg, or hip. °· Numbness in the back of the calf or sole of the foot. °· Burning sensations in the lower back, leg, or hip. °· Sharp pains in the lower back, leg, or hip. °· Leg weakness. °· Severe back pain inhibiting movement. °These symptoms may get worse with coughing, sneezing, laughing, or prolonged sitting or standing. Also, being overweight may worsen symptoms. °DIAGNOSIS  °Your caregiver will perform a physical exam to look for common symptoms of sciatica. He or she may ask you to do certain movements or activities that would trigger sciatic nerve pain. Other tests may be performed to find the cause of the sciatica. These may include: °· Blood tests. °· X-rays. °· Imaging tests, such as an MRI or CT scan. °TREATMENT  °Treatment is directed at the cause of the sciatic pain. Sometimes, treatment is not necessary  and the pain and discomfort goes away on its own. If treatment is needed, your caregiver may suggest: °· Over-the-counter medicines to relieve pain. °· Prescription medicines, such as anti-inflammatory medicine, muscle relaxants, or narcotics. °· Applying heat or ice to the painful area. °· Steroid injections to lessen pain, irritation, and inflammation around the nerve. °· Reducing activity during periods of pain. °· Exercising and stretching to strengthen your abdomen and improve flexibility of your spine. Your caregiver may suggest losing weight if the extra weight makes the back pain worse. °· Physical therapy. °· Surgery to eliminate what is pressing or pinching the nerve, such as a bone spur or part of a herniated disk. °HOME CARE INSTRUCTIONS  °· Only take over-the-counter or prescription medicines for pain or discomfort as directed by your caregiver. °· Apply ice to the affected area for 20 minutes, 3 4 times a day for the first 48 72 hours. Then try heat in the same way. °· Exercise, stretch, or perform your usual activities if these do not aggravate your pain. °· Attend physical therapy sessions as directed by your caregiver. °· Keep all follow-up appointments as directed by your caregiver. °· Do not wear high heels or shoes that do not provide proper support. °· Check your mattress to see if it is too soft. A firm mattress may lessen your pain and discomfort. °SEEK IMMEDIATE MEDICAL CARE IF:  °· You lose control of your bowel or bladder (incontinence). °· You have increasing weakness in the lower back,   pelvis, buttocks, or legs. °· You have redness or swelling of your back. °· You have a burning sensation when you urinate. °· You have pain that gets worse when you lie down or awakens you at night. °· Your pain is worse than you have experienced in the past. °· Your pain is lasting longer than 4 weeks. °· You are suddenly losing weight without reason. °MAKE SURE YOU: °· Understand these  instructions. °· Will watch your condition. °· Will get help right away if you are not doing well or get worse. °Document Released: 11/20/2001 Document Revised: 05/27/2012 Document Reviewed: 04/06/2012 °ExitCare® Patient Information ©2014 [employer]. ° °

## 2013-06-09 NOTE — Telephone Encounter (Signed)
Per Oran Rein, pt is to get a hip xray at [facility] today then see her this afternoon. I called and spoke with [facility] She will transport pt to [facility] for hips films and bring her to see Padonda today at 2:30pm. Encounter closed.

## 2013-06-09 NOTE — Progress Notes (Signed)
Subjective:    Patient ID: Tiffany [name], female    DOB: March 27, 1929, 79 y.o.   MRN: [id number]  HPI  Pt is a 79 year old white female who presents to PCP with R hip pain x 2 days; Pt states pain starts in mid lower back and radiates to hip and down to R knee. Pain is described as "sharp" and "shooting" and rated 10/10; Pt states pain is worse with movement and at times causes her R leg to "give out". Pt states pain has resulted in limping. Pt has a positive hx for arthritis, however states this new pain is nothing like previous arthritic pain, denies any recent falls or injuries prior to hip pain. Reports taking hydrocodone which temporarily relieved the pain overnight, but it has since returned.  Review of Systems  Constitutional: Negative.   HENT: Negative.   Eyes: Negative.   Respiratory: Negative.   Cardiovascular: Negative.   Gastrointestinal: Negative.   Endocrine: Negative.   Genitourinary: Negative.   Musculoskeletal: Positive for back pain and arthralgias.  Skin: Negative.   Allergic/Immunologic: Negative.   Neurological: Negative.   Hematological: Negative.   Psychiatric/Behavioral: Negative.    Past Medical History  Diagnosis Date  . Hypertension     History   Social History  . Marital Status: Married    Spouse Name: N/A    Number of Children: N/A  . Years of Education: N/A   Occupational History  . Not on file.   Social History Main Topics  . Smoking status: Never Smoker   . Smokeless tobacco: Not on file  . Alcohol Use: Yes     Comment: glass of wine per day  . Drug Use: No  . Sexually Active: Not on file   Other Topics Concern  . Not on file   Social History Narrative  . No narrative on file    Past Surgical History  Procedure Laterality Date  . Back surgery  09/25/2011  . Abdominal hysterectomy      No family history on file.  No Known Allergies  Current Outpatient Prescriptions on File Prior to Visit  Medication Sig Dispense Refill   . ALPRAZolam (XANAX) 0.5 MG tablet Take 1 tablet (0.5 mg total) by mouth daily.  90 tablet  2  . aspirin 81 MG tablet Take 81 mg by mouth daily.      . benzonatate (TESSALON) 200 MG capsule       . CRANBERRY PO Take 3,600 mg by mouth daily.      . hydrochlorothiazide (HYDRODIURIL) 12.5 MG tablet TAKE 1 TABLET EVERY DAY  90 tablet  PRN  . ipratropium (ATROVENT) 0.03 % nasal spray       . lisinopril (PRINIVIL,ZESTRIL) 20 MG tablet TAKE 1 TABLET EVERY DAY  90 tablet  PRN  . loratadine (CLARITIN) 10 MG tablet Take 1 tablet (10 mg total) by mouth daily.  90 tablet  1  . meloxicam (MOBIC) 7.5 MG tablet TAKE 1 TABLET EVERY DAY  90 tablet  PRN  . methylPREDNISolone (MEDROL DOSEPAK) 4 MG tablet follow package directions  21 tablet  0  . omeprazole (PRILOSEC) 20 MG capsule TAKE 1 CAPSULE EVERY DAY  90 capsule  PRN  . pravastatin (PRAVACHOL) 20 MG tablet Take 1 tablet (20 mg total) by mouth daily.  90 tablet  1   No current facility-administered medications on file prior to visit.    BP 122/72  Pulse 96  Wt 146 lb (66.225 kg)  BMI 24.68  kg/m2  SpO2 97%chart    Objective:   Physical Exam  Constitutional: She is oriented to person, place, and time. She appears well-developed and well-nourished.  HENT:  Head: Normocephalic and atraumatic.  Eyes: Pupils are equal, round, and reactive to light.  Neck: Normal range of motion.  Cardiovascular: Normal rate and regular rhythm.   Pulmonary/Chest: Effort normal and breath sounds normal.  Abdominal: Soft. Bowel sounds are normal.  Musculoskeletal:       Right hip: She exhibits tenderness.       Lumbar back: She exhibits tenderness.  Neurological: She is alert and oriented to person, place, and time.          Assessment & Plan:  1.Lumbar Radiculopathy  Pt instructed to continue previous prescription of medrol and meloxicam. Pt also prescribed hydrocodone, since she states it has managed breakthrough pain in the past. Pt to contact PCP if  symptoms persist or worsen.  Note by:S.Keah, FNP Student

## 2013-06-09 NOTE — Telephone Encounter (Signed)
Patient Information:  Caller Name: Lowella Bandy  Phone: [phone]  Patient: Tiffany, [name]  Gender: Female  DOB: 1929-10-02  Age: 79 Years  PCP: Adline Mango [facility])  Office Follow Up:  Does the office need to follow up with this patient?: Yes  Instructions For The Office: Return call to Step Daughter with further instructions.  RN Note:  Step Daughter states patient has history of arthritis. States patient developed increased right hip pain, onset 06/05/13. States patient was prescribed Medrol Dosepack, per Adline Mango 06/05/13. Caller states patient started Medrol Dosepack 06/07/13. States right hip pain continues to increase. States patient is only able ambulate short distances due to pain. States Patient's Grandaughter gave her 2 of her (Grandaughter's) Hydrocodone at 0600 06/09/13 with some relief. Patient states pain radiates down buttock and into leg. Denies urinary sx. Denies known injury to hip or back. No new swelling or deformity noted. States patient also complains of discomfort in lower back. States patient has increased weakness of right leg when ambulating. Care advice given per guidelines. Call back parameters reviewed. RN spoke with Tim Lair, in office, regarding ED disposition vs. office visit.  Advised to send note via Epic and she will give message to [employer]. Please return call to Step Daughter/Nikki at [phone]. Patient uses [facility] at [phone].  Symptoms  Reason For Call & Symptoms: Right Hip Pain  Reviewed Health History In EMR: Yes  Reviewed Medications In EMR: Yes  Reviewed Allergies In EMR: Yes  Reviewed Surgeries / Procedures: Yes  Date of Onset of Symptoms: 06/05/2013  Treatments Tried: Medrol Dosepack  Treatments Tried Worked: No  Guideline(s) Used:  Back Pain  Disposition Per Guideline:   Go to ED Now (or to Office with PCP Approval)  Reason For Disposition Reached:   Weakness of a leg  or foot (e.g., unable to bear weight, dragging foot)  Advice Given:  N/A  Patient Will Follow Care Advice:  YES

## 2013-07-15 ENCOUNTER — Other Ambulatory Visit: Payer: Self-pay

## 2013-07-20 ENCOUNTER — Ambulatory Visit (INDEPENDENT_AMBULATORY_CARE_PROVIDER_SITE_OTHER): Payer: Medicare PPO | Admitting: Family

## 2013-07-20 ENCOUNTER — Encounter: Payer: Self-pay | Admitting: Family

## 2013-07-20 VITALS — BP 122/68 | HR 88 | Wt 147.0 lb

## 2013-07-20 DIAGNOSIS — K219 Gastro-esophageal reflux disease without esophagitis: Secondary | ICD-10-CM

## 2013-07-20 DIAGNOSIS — J309 Allergic rhinitis, unspecified: Secondary | ICD-10-CM

## 2013-07-20 DIAGNOSIS — E7800 Pure hypercholesterolemia, unspecified: Secondary | ICD-10-CM

## 2013-07-20 DIAGNOSIS — R159 Full incontinence of feces: Secondary | ICD-10-CM

## 2013-07-20 DIAGNOSIS — I10 Essential (primary) hypertension: Secondary | ICD-10-CM

## 2013-07-20 LAB — HEPATIC FUNCTION PANEL
ALT: 18 U/L (ref 0–35)
AST: 27 U/L (ref 0–37)
Bilirubin, Direct: 0.1 mg/dL (ref 0.0–0.3)
Total Bilirubin: 0.8 mg/dL (ref 0.3–1.2)

## 2013-07-20 LAB — CBC WITH DIFFERENTIAL/PLATELET
Basophils Absolute: 0 10*3/uL (ref 0.0–0.1)
Eosinophils Absolute: 0.1 10*3/uL (ref 0.0–0.7)
Lymphocytes Relative: 22.9 % (ref 12.0–46.0)
MCHC: 31.4 g/dL (ref 30.0–36.0)
Monocytes Relative: 7.7 % (ref 3.0–12.0)
Neutro Abs: 5.2 10*3/uL (ref 1.4–7.7)
Platelets: 132 10*3/uL — ABNORMAL LOW (ref 150.0–400.0)
RDW: 15.7 % — ABNORMAL HIGH (ref 11.5–14.6)

## 2013-07-20 LAB — BASIC METABOLIC PANEL
BUN: 17 mg/dL (ref 6–23)
Chloride: 100 mEq/L (ref 96–112)
Creatinine, Ser: 1.1 mg/dL (ref 0.4–1.2)
GFR: 50.57 mL/min — ABNORMAL LOW (ref >60.00)
Potassium: 4.9 mEq/L (ref 3.5–5.1)

## 2013-07-20 LAB — LIPID PANEL
Cholesterol: 182 mg/dL (ref 0–200)
HDL: 52.6 mg/dL (ref >39.00)
LDL Cholesterol: 106 mg/dL — ABNORMAL HIGH (ref 0–99)
Total CHOL/HDL Ratio: 3
Triglycerides: 117 mg/dL (ref 0.0–149.0)
VLDL: 23.4 mg/dL (ref 0.0–40.0)

## 2013-07-20 LAB — TSH: TSH: 6.33 u[IU]/mL — ABNORMAL HIGH (ref 0.35–5.50)

## 2013-07-20 MED ORDER — HYDROCODONE-ACETAMINOPHEN 10-325 MG PO TABS: 0.5000 | ORAL_TABLET | Freq: Three times a day (TID) | ORAL | Status: DC | PRN

## 2013-07-20 NOTE — Patient Instructions (Addendum)
Colonoscopy  A colonoscopy is an exam to evaluate your entire colon. In this exam, your colon is cleansed. A long fiberoptic tube is inserted through your rectum and into your colon. The fiberoptic scope (endoscope) is a long bundle of enclosed and very flexible fibers. These fibers transmit light to the area examined and send images from that area to your caregiver. Discomfort is usually minimal. You may be given a drug to help you sleep (sedative) during or prior to the procedure. This exam helps to detect lumps (tumors), polyps, inflammation, and areas of bleeding. Your caregiver may also take a small piece of tissue (biopsy) that will be examined under a microscope.  LET YOUR CAREGIVER KNOW ABOUT:   · Allergies to food or medicine.  · Medicines taken, including vitamins, herbs, eyedrops, over-the-counter medicines, and creams.  · Use of steroids (by mouth or creams).  · Previous problems with anesthetics or numbing medicines.  · History of bleeding problems or blood clots.  · Previous surgery.  · Other health problems, including diabetes and kidney problems.  · Possibility of pregnancy, if this applies.  BEFORE THE PROCEDURE   · A clear liquid diet may be required for 2 days before the exam.  · Ask your caregiver about changing or stopping your regular medications.  · Liquid injections (enemas) or laxatives may be required.  · A large amount of electrolyte solution may be given to you to drink over a short period of time. This solution is used to clean out your colon.  · You should be present 60 minutes prior to your procedure or as directed by your caregiver.  AFTER THE PROCEDURE   · If you received a sedative or pain relieving medication, you will need to arrange for someone to drive you home.  · Occasionally, there is a little blood passed with the first bowel movement. Do not be concerned.  FINDING OUT THE RESULTS OF YOUR TEST  Not all test results are available during your visit. If your test results are  not back during the visit, make an appointment with your caregiver to find out the results. Do not assume everything is normal if you have not heard from your caregiver or the medical facility. It is important for you to follow up on all of your test results.  HOME CARE INSTRUCTIONS   · It is not unusual to pass moderate amounts of gas and experience mild abdominal cramping following the procedure. This is due to air being used to inflate your colon during the exam. Walking or a warm pack on your belly (abdomen) may help.  · You may resume all normal meals and activities after sedatives and medicines have worn off.  · Only take over-the-counter or prescription medicines for pain, discomfort, or fever as directed by your caregiver. Do not use aspirin or blood thinners if a biopsy was taken. Consult your caregiver for medicine usage if biopsies were taken.  SEEK IMMEDIATE MEDICAL CARE IF:   · You have a fever.  · You pass large blood clots or fill a toilet with blood following the procedure. This may also occur 10 to 14 days following the procedure. This is more likely if a biopsy was taken.  · You develop abdominal pain that keeps getting worse and cannot be relieved with medicine.  Document Released: 11/23/2000 Document Revised: 02/18/2012 Document Reviewed: 07/08/2008  ExitCare® Patient Information ©2014 [employer].

## 2013-07-20 NOTE — Progress Notes (Signed)
Subjective:    Patient ID: Tiffany [name], female    DOB: 1929/01/05, 79 y.o.   MRN: [id number]  HPI  79 year old female, nonsmoker them for recheck of hyperlipidemia, anxiety, osteoarthritis, hypertension. She reports that she still well overall. Has concerns fecal incontinence particularly when eating cherry tomatoes and fruit cocktail. The stool is typically formed. However does not have control over her stool when she eats them. She has scaled back on eating them that helps. Last colonoscopy approximately 10 years ago.   Review of Systems  Constitutional: Negative.   HENT: Negative.   Respiratory: Negative.   Cardiovascular: Negative.   Gastrointestinal: Negative.  Negative for nausea, abdominal pain, diarrhea, constipation and blood in stool.       Occasional fecal incontinence  Endocrine: Negative.   Genitourinary: Negative.   Musculoskeletal: Negative.   Skin: Negative.   Neurological: Negative.   Hematological: Negative.   Psychiatric/Behavioral: Negative.    Past Medical History  Diagnosis Date  . Hypertension     History   Social History  . Marital Status: Married    Spouse Name: N/A    Number of Children: N/A  . Years of Education: N/A   Occupational History  . Not on file.   Social History Main Topics  . Smoking status: Never Smoker   . Smokeless tobacco: Not on file  . Alcohol Use: Yes     Comment: glass of wine per day  . Drug Use: No  . Sexually Active: Not on file   Other Topics Concern  . Not on file   Social History Narrative  . No narrative on file    Past Surgical History  Procedure Laterality Date  . Back surgery  09/25/2011  . Abdominal hysterectomy      No family history on file.  No Known Allergies  Current Outpatient Prescriptions on File Prior to Visit  Medication Sig Dispense Refill  . aspirin 81 MG tablet Take 81 mg by mouth daily.      . hydrochlorothiazide (HYDRODIURIL) 12.5 MG tablet TAKE 1 TABLET EVERY DAY  90  tablet  PRN  . lisinopril (PRINIVIL,ZESTRIL) 20 MG tablet TAKE 1 TABLET EVERY DAY  90 tablet  PRN  . loratadine (CLARITIN) 10 MG tablet Take 1 tablet (10 mg total) by mouth daily.  90 tablet  1  . omeprazole (PRILOSEC) 20 MG capsule TAKE 1 CAPSULE EVERY DAY  90 capsule  PRN  . benzonatate (TESSALON) 200 MG capsule       . CRANBERRY PO Take 3,600 mg by mouth daily.      Marland Kitchen ipratropium (ATROVENT) 0.03 % nasal spray       . meloxicam (MOBIC) 7.5 MG tablet TAKE 1 TABLET EVERY DAY  90 tablet  PRN   No current facility-administered medications on file prior to visit.    BP 122/68  Pulse 88  Wt 147 lb (66.679 kg)  BMI 24.85 kg/m2chart    Objective:   Physical Exam  Constitutional: She is oriented to person, place, and time. She appears well-developed and well-nourished.  HENT:  Right Ear: External ear normal.  Nose: Nose normal.  Mouth/Throat: Oropharynx is clear and moist.  Neck: Normal range of motion. Neck supple. No thyromegaly present.  Cardiovascular: Normal rate, regular rhythm and normal heart sounds.   Pulmonary/Chest: Effort normal and breath sounds normal.  Abdominal: Soft. Bowel sounds are normal. There is no tenderness. There is no rebound and no guarding.  Musculoskeletal: Normal range of motion.  Neurological:  She is alert and oriented to person, place, and time.  Skin: Skin is warm and dry.  Psychiatric: She has a normal mood and affect.          Assessment & Plan:  Assessment: 1. Hypertension 2. GERD 3. Hyperlipidemia 4. Osteoarthritis  Plan: Continue current medications. DC Xanax. Consider referral to GI fecal incontinence continues., The office with any questions or concerns. Check in 6 months and sooner as needed. Flu shot October

## 2013-08-05 ENCOUNTER — Other Ambulatory Visit: Payer: Self-pay

## 2013-08-06 ENCOUNTER — Telehealth: Payer: Self-pay | Admitting: Family

## 2013-08-06 MED ORDER — MELOXICAM 7.5 MG PO TABS: ORAL_TABLET | ORAL | Status: DC

## 2013-08-06 MED ORDER — HYDROCHLOROTHIAZIDE 12.5 MG PO TABS: ORAL_TABLET | ORAL | Status: DC

## 2013-08-06 MED ORDER — LISINOPRIL 20 MG PO TABS: ORAL_TABLET | ORAL | Status: DC

## 2013-08-06 MED ORDER — OMEPRAZOLE 20 MG PO CPDR: DELAYED_RELEASE_CAPSULE | ORAL | Status: DC

## 2013-08-06 NOTE — Telephone Encounter (Signed)
Pt would like tamesha to call [facility] to see want meds she needs a refill on.  Pt does not know the names of medications she needs.

## 2013-08-06 NOTE — Telephone Encounter (Signed)
Medications refilled verbally with pharmacist Adam at [facility] and they were notified that xanax was d/c by provider

## 2013-08-12 ENCOUNTER — Telehealth: Payer: Self-pay | Admitting: Family

## 2013-08-12 DIAGNOSIS — R7989 Other specified abnormal findings of blood chemistry: Secondary | ICD-10-CM

## 2013-08-12 NOTE — Telephone Encounter (Signed)
Pt would like to discuss labs, specifically tsh results. pls call pt.

## 2013-08-12 NOTE — Telephone Encounter (Signed)
Spoke with pt and scheduled lab appointment to recheck TSH on 08/25/13. Advised that sometimes the tsh is abnormal and upon recheck it is normal which is why Tiffany [name] wants to recheck it in 6 weeks. If it is still abnormal after recheck, then we will tx accordingly

## 2013-08-25 ENCOUNTER — Other Ambulatory Visit (INDEPENDENT_AMBULATORY_CARE_PROVIDER_SITE_OTHER): Payer: Medicare PPO

## 2013-08-25 ENCOUNTER — Ambulatory Visit (INDEPENDENT_AMBULATORY_CARE_PROVIDER_SITE_OTHER): Payer: Medicare PPO

## 2013-08-25 DIAGNOSIS — E785 Hyperlipidemia, unspecified: Secondary | ICD-10-CM

## 2013-08-25 DIAGNOSIS — R7989 Other specified abnormal findings of blood chemistry: Secondary | ICD-10-CM

## 2013-08-25 DIAGNOSIS — Z23 Encounter for immunization: Secondary | ICD-10-CM

## 2013-08-25 LAB — TSH: TSH: 3.83 u[IU]/mL (ref 0.35–5.50)

## 2013-09-01 ENCOUNTER — Telehealth: Payer: Self-pay | Admitting: Family

## 2013-09-01 NOTE — Telephone Encounter (Signed)
Pt calling to request lab results from 08/25/13. Please assist.

## 2013-09-02 NOTE — Telephone Encounter (Signed)
Pt aware TSH is normal.

## 2013-09-07 ENCOUNTER — Other Ambulatory Visit: Payer: Self-pay

## 2013-09-07 MED ORDER — HYDROCHLOROTHIAZIDE 12.5 MG PO TABS: ORAL_TABLET | ORAL | Status: DC

## 2013-09-07 MED ORDER — OMEPRAZOLE 20 MG PO CPDR: DELAYED_RELEASE_CAPSULE | ORAL | Status: DC

## 2013-11-02 ENCOUNTER — Telehealth: Payer: Self-pay | Admitting: Family

## 2013-11-02 MED ORDER — HYDROCODONE-ACETAMINOPHEN 10-325 MG PO TABS: 0.5000 | ORAL_TABLET | Freq: Three times a day (TID) | ORAL | Status: DC | PRN

## 2013-11-02 NOTE — Telephone Encounter (Signed)
Pt wants to talk to you about her hydrochlorothiazide (HYDRODIURIL) 12.5 MG tablet, she is currently getting it filled through [facility], however they need a written rx (pt states [facility] mailed in a letter stating that and pt is not sure if we have received it or not), pt can get rx from [facility] but she has to pay more.  Please advise.

## 2013-11-02 NOTE — Telephone Encounter (Signed)
Pt needed refill of Hydrocodone not the HCTZ. Spoke with pt and she will pick rx up tomorrow and report to the lab for urine drug screen

## 2013-11-25 ENCOUNTER — Encounter: Payer: Self-pay | Admitting: Family

## 2013-12-11 ENCOUNTER — Telehealth: Payer: Self-pay | Admitting: Family

## 2013-12-11 NOTE — Telephone Encounter (Signed)
Pt request RX HYDROcodone-acetaminophen (NORCO) 10-325 MG per tablet Aware Padonda out until Mon. ok

## 2013-12-14 MED ORDER — HYDROCODONE-ACETAMINOPHEN 10-325 MG PO TABS: 0.5000 | ORAL_TABLET | Freq: Three times a day (TID) | ORAL | Status: DC | PRN

## 2013-12-14 NOTE — Telephone Encounter (Signed)
done

## 2013-12-15 NOTE — Telephone Encounter (Signed)
Pt aware.

## 2014-01-11 ENCOUNTER — Other Ambulatory Visit (INDEPENDENT_AMBULATORY_CARE_PROVIDER_SITE_OTHER): Payer: Medicare PPO

## 2014-01-11 DIAGNOSIS — Z0000 Encounter for general adult medical examination without abnormal findings: Secondary | ICD-10-CM

## 2014-01-11 LAB — CBC WITH DIFFERENTIAL/PLATELET
BASOS ABS: 0 10*3/uL (ref 0.0–0.1)
BASOS PCT: 0.3 % (ref 0.0–3.0)
EOS ABS: 0.2 10*3/uL (ref 0.0–0.7)
Eosinophils Relative: 2.6 % (ref 0.0–5.0)
HCT: 38.2 % (ref 36.0–46.0)
Hemoglobin: 12.3 g/dL (ref 12.0–15.0)
LYMPHS PCT: 22.3 % (ref 12.0–46.0)
Lymphs Abs: 1.5 10*3/uL (ref 0.7–4.0)
MCHC: 32.2 g/dL (ref 30.0–36.0)
MCV: 85.6 fl (ref 78.0–100.0)
Monocytes Absolute: 0.5 10*3/uL (ref 0.1–1.0)
Monocytes Relative: 6.9 % (ref 3.0–12.0)
NEUTROS PCT: 67.9 % (ref 43.0–77.0)
Neutro Abs: 4.7 10*3/uL (ref 1.4–7.7)
PLATELETS: 114 10*3/uL — AB (ref 150.0–400.0)
RBC: 4.46 Mil/uL (ref 3.87–5.11)
RDW: 15 % — AB (ref 11.5–14.6)
WBC: 6.9 10*3/uL (ref 4.5–10.5)

## 2014-01-11 LAB — HEPATIC FUNCTION PANEL
ALBUMIN: 3.8 g/dL (ref 3.5–5.2)
ALK PHOS: 95 U/L (ref 39–117)
ALT: 14 U/L (ref 0–35)
AST: 18 U/L (ref 0–37)
BILIRUBIN TOTAL: 1 mg/dL (ref 0.3–1.2)
Bilirubin, Direct: 0.1 mg/dL (ref 0.0–0.3)
Total Protein: 6.8 g/dL (ref 6.0–8.3)

## 2014-01-11 LAB — LIPID PANEL
CHOLESTEROL: 166 mg/dL (ref 0–200)
HDL: 41.5 mg/dL (ref >39.00)
LDL CALC: 95 mg/dL (ref 0–99)
TRIGLYCERIDES: 149 mg/dL (ref 0.0–149.0)
Total CHOL/HDL Ratio: 4
VLDL: 29.8 mg/dL (ref 0.0–40.0)

## 2014-01-11 LAB — BASIC METABOLIC PANEL
BUN: 23 mg/dL (ref 6–23)
CHLORIDE: 104 meq/L (ref 96–112)
CO2: 30 mEq/L (ref 19–32)
Calcium: 9.2 mg/dL (ref 8.4–10.5)
Creatinine, Ser: 0.8 mg/dL (ref 0.4–1.2)
GFR: 68.95 mL/min (ref >60.00)
Glucose, Bld: 80 mg/dL (ref 70–99)
POTASSIUM: 3.4 meq/L — AB (ref 3.5–5.1)
SODIUM: 141 meq/L (ref 135–145)

## 2014-01-11 LAB — POCT URINALYSIS DIPSTICK
Bilirubin, UA: NEGATIVE
Glucose, UA: NEGATIVE
KETONES UA: NEGATIVE
Nitrite, UA: NEGATIVE
PROTEIN UA: NEGATIVE
RBC UA: NEGATIVE
SPEC GRAV UA: 1.02
UROBILINOGEN UA: 2
pH, UA: 7.5

## 2014-01-11 LAB — TSH: TSH: 3.31 u[IU]/mL (ref 0.35–5.50)

## 2014-01-20 ENCOUNTER — Encounter: Payer: Self-pay | Admitting: Family

## 2014-01-20 ENCOUNTER — Ambulatory Visit (INDEPENDENT_AMBULATORY_CARE_PROVIDER_SITE_OTHER): Payer: Medicare PPO | Admitting: Family

## 2014-01-20 VITALS — BP 122/62 | HR 62 | Ht 64.0 in | Wt 142.0 lb

## 2014-01-20 DIAGNOSIS — G4700 Insomnia, unspecified: Secondary | ICD-10-CM

## 2014-01-20 DIAGNOSIS — Z0000 Encounter for general adult medical examination without abnormal findings: Secondary | ICD-10-CM

## 2014-01-20 DIAGNOSIS — J309 Allergic rhinitis, unspecified: Secondary | ICD-10-CM

## 2014-01-20 DIAGNOSIS — I10 Essential (primary) hypertension: Secondary | ICD-10-CM

## 2014-01-20 DIAGNOSIS — K921 Melena: Secondary | ICD-10-CM

## 2014-01-20 MED ORDER — OMEPRAZOLE 40 MG PO CPDR: DELAYED_RELEASE_CAPSULE | ORAL | Status: DC

## 2014-01-20 MED ORDER — TRAZODONE HCL 50 MG PO TABS: 25.0000 mg | ORAL_TABLET | Freq: Every evening | ORAL | Status: DC | PRN

## 2014-01-20 NOTE — Progress Notes (Signed)
Subjective:    Patient ID: Tiffany [name], female    DOB: 21-Apr-1929, 80 y.o.   MRN: [id number]  HPI 80 year old WF, nonsmoker, is in today for a CPX.  Reviewed all health maintenance protocols including mammography colonoscopy bone density and reviewed appropriate screening labs. Her immunization history was reviewed as well as her current medications and allergies refills of her chronic medications were given and the plan for yearly health maintenance was discussed all orders and referrals were made as appropriate.  Has concerns of black stools ongoing x 3 weeks. Denies any bright red blood. Has a family history of colon cancer in her sister. Last colonoscopy was 5 years ago. Declines mammogram.   Review of Systems  Constitutional: Negative.   HENT: Negative.   Eyes: Negative.   Respiratory: Negative.   Cardiovascular: Negative.   Gastrointestinal: Negative for abdominal pain, diarrhea, constipation, abdominal distention and rectal pain.       Black stools.   Endocrine: Negative.   Genitourinary: Negative.   Musculoskeletal: Negative.   Skin: Negative.   Allergic/Immunologic: Negative.   Neurological: Negative.   Hematological: Negative.   Psychiatric/Behavioral: Negative.    Past Medical History  Diagnosis Date  . Hypertension     History   Social History  . Marital Status: Married    Spouse Name: N/A    Number of Children: N/A  . Years of Education: N/A   Occupational History  . Not on file.   Social History Main Topics  . Smoking status: Never Smoker   . Smokeless tobacco: Not on file  . Alcohol Use: Yes     Comment: glass of wine per day  . Drug Use: No  . Sexual Activity: Not on file   Other Topics Concern  . Not on file   Social History Narrative  . No narrative on file    Past Surgical History  Procedure Laterality Date  . Back surgery  09/25/2011  . Abdominal hysterectomy      No family history on file.  No Known Allergies  Current  Outpatient Prescriptions on File Prior to Visit  Medication Sig Dispense Refill  . aspirin 81 MG tablet Take 81 mg by mouth daily.      . benzonatate (TESSALON) 200 MG capsule       . CRANBERRY PO Take 3,600 mg by mouth daily.      . hydrochlorothiazide (HYDRODIURIL) 12.5 MG tablet TAKE 1 TABLET EVERY DAY  90 tablet  PRN  . ipratropium (ATROVENT) 0.03 % nasal spray       . lisinopril (PRINIVIL,ZESTRIL) 20 MG tablet TAKE 1 TABLET EVERY DAY  90 tablet  PRN  . loratadine (CLARITIN) 10 MG tablet Take 1 tablet (10 mg total) by mouth daily.  90 tablet  1   No current facility-administered medications on file prior to visit.    BP 122/62  Pulse 62  Ht 5\' 4"  (1.626 m)  Wt 142 lb (64.411 kg)  BMI 24.36 kg/m2chart    Objective:   Physical Exam  Constitutional: She is oriented to person, place, and time. She appears well-developed and well-nourished.  HENT:  Head: Normocephalic.  Right Ear: External ear normal.  Left Ear: External ear normal.  Nose: Nose normal.  Mouth/Throat: Oropharynx is clear and moist.  Eyes: Conjunctivae are normal. Pupils are equal, round, and reactive to light.  Neck: Normal range of motion. Neck supple. No thyromegaly present.  Cardiovascular: Normal rate, regular rhythm and normal heart sounds.  Pulmonary/Chest: Effort normal and breath sounds normal.  Abdominal: Soft. Bowel sounds are normal. She exhibits no distension. There is no tenderness. There is no rebound.  Genitourinary: Guaiac positive stool.  Musculoskeletal: Normal range of motion. She exhibits no edema and no tenderness.  Neurological: She is alert and oriented to person, place, and time. She has normal reflexes. She displays normal reflexes. No cranial nerve deficit. Coordination normal.  Skin: Skin is warm and dry.  Psychiatric: She has a normal mood and affect.          Assessment & Plan:  Tiffany [name] was seen today for annual exam.  Diagnoses and associated orders for this  visit:  Preventative health care  Melena - Ambulatory referral to Gastroenterology  Unspecified essential hypertension  Allergic rhinitis  Insomnia  Other Orders - omeprazole (PRILOSEC) 40 MG capsule; TAKE 1 CAPSULE EVERY DAY - traZODone (DESYREL) 50 MG tablet; Take 0.5-1 tablets (25-50 mg total) by mouth at bedtime as needed for sleep.   Discontinue Mobic. Do not take any NSAIDS. Resume Omeprazole daily. Start trazodone to help sleep.

## 2014-01-21 ENCOUNTER — Telehealth: Payer: Self-pay | Admitting: Family

## 2014-01-21 ENCOUNTER — Encounter: Payer: Self-pay | Admitting: Gastroenterology

## 2014-01-21 NOTE — Telephone Encounter (Signed)
Relevant patient education assigned to patient using Emmi. ° °

## 2014-02-23 ENCOUNTER — Encounter: Payer: Self-pay | Admitting: Gastroenterology

## 2014-02-23 ENCOUNTER — Ambulatory Visit (INDEPENDENT_AMBULATORY_CARE_PROVIDER_SITE_OTHER): Payer: Medicare PPO | Admitting: Gastroenterology

## 2014-02-23 ENCOUNTER — Other Ambulatory Visit (INDEPENDENT_AMBULATORY_CARE_PROVIDER_SITE_OTHER): Payer: Medicare PPO

## 2014-02-23 VITALS — BP 120/66 | HR 64 | Ht 64.0 in | Wt 134.4 lb

## 2014-02-23 DIAGNOSIS — D649 Anemia, unspecified: Secondary | ICD-10-CM

## 2014-02-23 DIAGNOSIS — Z8719 Personal history of other diseases of the digestive system: Secondary | ICD-10-CM | POA: Insufficient documentation

## 2014-02-23 LAB — CBC WITH DIFFERENTIAL/PLATELET
BASOS ABS: 0 10*3/uL (ref 0.0–0.1)
Basophils Relative: 0.4 % (ref 0.0–3.0)
EOS PCT: 1.9 % (ref 0.0–5.0)
Eosinophils Absolute: 0.1 10*3/uL (ref 0.0–0.7)
HCT: 35.6 % — ABNORMAL LOW (ref 36.0–46.0)
Hemoglobin: 11.3 g/dL — ABNORMAL LOW (ref 12.0–15.0)
LYMPHS ABS: 1.4 10*3/uL (ref 0.7–4.0)
Lymphocytes Relative: 19.5 % (ref 12.0–46.0)
MCHC: 31.6 g/dL (ref 30.0–36.0)
MCV: 84 fl (ref 78.0–100.0)
MONO ABS: 0.5 10*3/uL (ref 0.1–1.0)
Monocytes Relative: 7.3 % (ref 3.0–12.0)
NEUTROS PCT: 70.9 % (ref 43.0–77.0)
Neutro Abs: 5.1 10*3/uL (ref 1.4–7.7)
PLATELETS: 134 10*3/uL — AB (ref 150.0–400.0)
RBC: 4.24 Mil/uL (ref 3.87–5.11)
RDW: 14.4 % (ref 11.5–14.6)
WBC: 7.3 10*3/uL (ref 4.5–10.5)

## 2014-02-23 NOTE — Progress Notes (Signed)
_                                                                                                                History of Present Illness: Pleasant 80 year old white female referred for evaluation of black stools.  Apparently 2 months ago she had several days where she passed black poorly formed and liquid stools.  There were no accompanying symptoms at that time.  She has had no recurrences.  Stools are brown and solid.    She's on no gastric irritants including nonsteroidals.  She takes omeprazole.  Last colonoscopy apparently was about 6 years ago in [place].    Past Medical History  Diagnosis Date  . Hypertension   . Arthritis    Past Surgical History  Procedure Laterality Date  . Back surgery  09/25/2011  . Abdominal hysterectomy     family history is not on file. Current Outpatient Prescriptions  Medication Sig Dispense Refill  . aspirin 81 MG tablet Take 81 mg by mouth daily.      . benzonatate (TESSALON) 200 MG capsule       . CRANBERRY PO Take 3,600 mg by mouth daily.      . hydrochlorothiazide (HYDRODIURIL) 12.5 MG tablet TAKE 1 TABLET EVERY DAY  90 tablet  PRN  . ipratropium (ATROVENT) 0.03 % nasal spray       . lisinopril (PRINIVIL,ZESTRIL) 20 MG tablet TAKE 1 TABLET EVERY DAY  90 tablet  PRN  . loratadine (CLARITIN) 10 MG tablet Take 1 tablet (10 mg total) by mouth daily.  90 tablet  1  . Multiple Vitamin (MULTIVITAMIN) tablet Take 1 tablet by mouth daily.      . Multiple Vitamins-Minerals (MACUVITE EYE CARE) TABS Take 1 tablet by mouth daily.      Marland Kitchen omeprazole (PRILOSEC) 40 MG capsule TAKE 1 CAPSULE EVERY DAY  90 capsule  1  . traZODone (DESYREL) 50 MG tablet Take 0.5-1 tablets (25-50 mg total) by mouth at bedtime as needed for sleep.  30 tablet  3   No current facility-administered medications for this visit.   Allergies as of 02/23/2014  . (No Known Allergies)    reports that she has never smoked. She has never used smokeless tobacco. She  reports that she drinks alcohol. She reports that she does not use illicit drugs.     Review of Systems: Pertinent positive and negative review of systems were noted in the above HPI section. All other review of systems were otherwise negative.  Vital signs were reviewed in today's medical record Physical Exam: General: Well developed , well nourished, no acute distress Skin: anicteric Head: Normocephalic and atraumatic Eyes:  sclerae anicteric, EOMI Ears: Normal auditory acuity Mouth: No deformity or lesions Neck: Supple, no masses or thyromegaly Lungs: Clear throughout to auscultation Heart: Regular rate and rhythm; no murmurs, rubs or bruits Abdomen: Soft, non tender and non distended. No masses, hepatosplenomegaly or hernias noted. Normal Bowel sounds Rectal:deferred Musculoskeletal: Symmetrical with no gross deformities  Skin: No  lesions on visible extremities Pulses:  Normal pulses noted Extremities: No clubbing, cyanosis, edema or deformities noted Neurological: Alert oriented x 4, grossly nonfocal Cervical Nodes:  No significant cervical adenopathy Inguinal Nodes: No significant inguinal adenopathy Psychological:  Alert and cooperative. Normal mood and affect  See Assessment and Plan under Problem List

## 2014-02-23 NOTE — Patient Instructions (Signed)
Go to the basement for labs today Please resubmit your hemoccult test within 2 weeks You signed a release to get GI reports from [place]

## 2014-02-23 NOTE — Assessment & Plan Note (Signed)
There is no clinical evidence for ongoing GI bleeding.  History of black stools certainly raises the question of melena.  Patient currently is asymptomatic.  Recommendations #1 check CBC and stool Hemoccults

## 2014-03-04 ENCOUNTER — Other Ambulatory Visit (INDEPENDENT_AMBULATORY_CARE_PROVIDER_SITE_OTHER): Payer: Medicare PPO

## 2014-03-04 DIAGNOSIS — D649 Anemia, unspecified: Secondary | ICD-10-CM

## 2014-03-04 LAB — FECAL OCCULT BLOOD, IMMUNOCHEMICAL: FECAL OCCULT BLD: NEGATIVE

## 2014-03-30 ENCOUNTER — Encounter: Payer: Self-pay | Admitting: Family

## 2014-03-30 ENCOUNTER — Ambulatory Visit (INDEPENDENT_AMBULATORY_CARE_PROVIDER_SITE_OTHER): Payer: Medicare PPO | Admitting: Family

## 2014-03-30 VITALS — BP 128/70 | HR 62 | Temp 98.4°F | Ht 64.0 in | Wt 133.0 lb

## 2014-03-30 DIAGNOSIS — R634 Abnormal weight loss: Secondary | ICD-10-CM

## 2014-03-30 LAB — CBC WITH DIFFERENTIAL/PLATELET
BASOS ABS: 0 10*3/uL (ref 0.0–0.1)
Basophils Relative: 0.4 % (ref 0.0–3.0)
EOS PCT: 2.2 % (ref 0.0–5.0)
Eosinophils Absolute: 0.2 10*3/uL (ref 0.0–0.7)
HEMATOCRIT: 37.5 % (ref 36.0–46.0)
Hemoglobin: 12 g/dL (ref 12.0–15.0)
LYMPHS ABS: 1.9 10*3/uL (ref 0.7–4.0)
LYMPHS PCT: 26.2 % (ref 12.0–46.0)
MCHC: 32 g/dL (ref 30.0–36.0)
MCV: 82.5 fl (ref 78.0–100.0)
MONOS PCT: 7.6 % (ref 3.0–12.0)
Monocytes Absolute: 0.5 10*3/uL (ref 0.1–1.0)
NEUTROS PCT: 63.6 % (ref 43.0–77.0)
Neutro Abs: 4.5 10*3/uL (ref 1.4–7.7)
PLATELETS: 128 10*3/uL — AB (ref 150.0–400.0)
RBC: 4.55 Mil/uL (ref 3.87–5.11)
RDW: 14.9 % — AB (ref 11.5–14.6)
WBC: 7.1 10*3/uL (ref 4.5–10.5)

## 2014-03-30 LAB — T4, FREE: FREE T4: 0.93 ng/dL (ref 0.60–1.60)

## 2014-03-30 LAB — T3, FREE: T3 FREE: 2.7 pg/mL (ref 2.3–4.2)

## 2014-03-30 LAB — TSH: TSH: 2.21 u[IU]/mL (ref 0.35–5.50)

## 2014-03-30 NOTE — Progress Notes (Signed)
Subjective:    Patient ID: Tiffany [name], female    DOB: [birth year], 80 y.o.   MRN: [id number]  HPI  80 year old white female, nonsmoker is in today for evaluation of weight loss. She has lost approximately 11 pounds over the last 6-8 weeks. Reports she has decreased appetite. Was seen by gastroenterology and had guaiac testing that w as negative. CBC was stable. Reports she's sleeping better taking trazodone at bedtime for insomnia.  Review of Systems  Constitutional: Positive for appetite change and unexpected weight change.  HENT: Negative.   Respiratory: Negative.   Cardiovascular: Negative.   Gastrointestinal: Negative.   Endocrine: Negative.   Genitourinary: Negative.   Musculoskeletal: Negative.   Skin: Negative.   Neurological: Negative.   Hematological: Negative.   Psychiatric/Behavioral: Negative.    Past Medical History  Diagnosis Date  . Hypertension   . Arthritis     History   Social History  . Marital Status: Married    Spouse Name: N/A    Number of Children: 1  . Years of Education: N/A   Occupational History  . Retired    Social History Main Topics  . Smoking status: Never Smoker   . Smokeless tobacco: Never Used  . Alcohol Use: Yes     Comment: glass of wine per day  . Drug Use: No  . Sexual Activity: Not on file   Other Topics Concern  . Not on file   Social History Narrative  . No narrative on file    Past Surgical History  Procedure Laterality Date  . Back surgery  09/25/2011  . Abdominal hysterectomy      No family history on file.  No Known Allergies  Current Outpatient Prescriptions on File Prior to Visit  Medication Sig Dispense Refill  . aspirin 81 MG tablet Take 81 mg by mouth daily.      . benzonatate (TESSALON) 200 MG capsule       . CRANBERRY PO Take 3,600 mg by mouth daily.      . hydrochlorothiazide (HYDRODIURIL) 12.5 MG tablet TAKE 1 TABLET EVERY DAY  90 tablet  PRN  . ipratropium (ATROVENT) 0.03 % nasal spray        . lisinopril (PRINIVIL,ZESTRIL) 20 MG tablet TAKE 1 TABLET EVERY DAY  90 tablet  PRN  . loratadine (CLARITIN) 10 MG tablet Take 1 tablet (10 mg total) by mouth daily.  90 tablet  1  . Multiple Vitamin (MULTIVITAMIN) tablet Take 1 tablet by mouth daily.      . Multiple Vitamins-Minerals (MACUVITE EYE CARE) TABS Take 1 tablet by mouth daily.      Marland Kitchen omeprazole (PRILOSEC) 40 MG capsule TAKE 1 CAPSULE EVERY DAY  90 capsule  1  . traZODone (DESYREL) 50 MG tablet Take 0.5-1 tablets (25-50 mg total) by mouth at bedtime as needed for sleep.  30 tablet  3   No current facility-administered medications on file prior to visit.    BP 128/70  Pulse 62  Temp(Src) 98.4 F (36.9 C) (Oral)  Ht 5\' 4"  (1.626 m)  Wt 133 lb (60.328 kg)  BMI 22.82 kg/m2chart    Objective:   Physical Exam  Constitutional: She is oriented to person, place, and time. She appears well-developed and well-nourished.  HENT:  Right Ear: External ear normal.  Left Ear: External ear normal.  Nose: Nose normal.  Mouth/Throat: Oropharynx is clear and moist.  Neck: Normal range of motion. Neck supple.  Cardiovascular: Normal rate, regular rhythm and  normal heart sounds.   Pulmonary/Chest: Effort normal and breath sounds normal.  Abdominal: Soft. Bowel sounds are normal.  Musculoskeletal: Normal range of motion.  Neurological: She is alert and oriented to person, place, and time.  Skin: Skin is warm and dry.  Psychiatric: She has a normal mood and affect.          Assessment & Plan:  Lainie was seen today for follow-up.  Diagnoses and associated orders for this visit:  Loss of weight - CBC with Differential - TSH - Cancel: T3 - T4, Free - T3, free   Call the office with any questions or concerns. Recheck as scheduled and as needed.

## 2014-03-30 NOTE — Progress Notes (Signed)
Pre visit review using our clinic review tool, if applicable. No additional management support is needed unless otherwise documented below in the visit note. 

## 2014-03-31 ENCOUNTER — Telehealth: Payer: Self-pay | Admitting: Family

## 2014-03-31 NOTE — Telephone Encounter (Signed)
Pt aware.

## 2014-03-31 NOTE — Telephone Encounter (Signed)
Pt would like blood work results °

## 2014-04-07 ENCOUNTER — Telehealth: Payer: Self-pay | Admitting: Family

## 2014-04-07 NOTE — Telephone Encounter (Signed)
Yes. Please call pt to schedule injection appt for Prevnar 13. Thanks!

## 2014-04-07 NOTE — Telephone Encounter (Signed)
Pt would like to know if Padonda would llike her to have a pneumonia shot. pls advie

## 2014-04-08 NOTE — Telephone Encounter (Signed)
Pt would like me to call her mid week next week. Pt has a lot going on this week, so we will touch base next week.

## 2014-04-14 ENCOUNTER — Other Ambulatory Visit: Payer: Self-pay | Admitting: Family

## 2014-04-15 ENCOUNTER — Ambulatory Visit (INDEPENDENT_AMBULATORY_CARE_PROVIDER_SITE_OTHER): Payer: Medicare PPO | Admitting: *Deleted

## 2014-04-15 DIAGNOSIS — Z23 Encounter for immunization: Secondary | ICD-10-CM

## 2014-04-19 ENCOUNTER — Telehealth: Payer: Self-pay | Admitting: Family

## 2014-04-19 MED ORDER — OMEPRAZOLE 40 MG PO CPDR: DELAYED_RELEASE_CAPSULE | ORAL | Status: DC

## 2014-04-19 NOTE — Telephone Encounter (Signed)
[facility] is requesting re-fill on omeprazole (PRILOSEC) 40 MG capsule

## 2014-04-19 NOTE — Telephone Encounter (Signed)
Rx sent 

## 2014-05-12 ENCOUNTER — Ambulatory Visit (INDEPENDENT_AMBULATORY_CARE_PROVIDER_SITE_OTHER): Payer: Medicare PPO | Admitting: Family

## 2014-05-12 ENCOUNTER — Encounter: Payer: Self-pay | Admitting: Family

## 2014-05-12 VITALS — BP 120/68 | HR 68 | Temp 98.5°F | Wt 135.0 lb

## 2014-05-12 DIAGNOSIS — R1310 Dysphagia, unspecified: Secondary | ICD-10-CM

## 2014-05-12 DIAGNOSIS — M129 Arthropathy, unspecified: Secondary | ICD-10-CM

## 2014-05-12 DIAGNOSIS — M1990 Unspecified osteoarthritis, unspecified site: Secondary | ICD-10-CM

## 2014-05-12 DIAGNOSIS — R634 Abnormal weight loss: Secondary | ICD-10-CM

## 2014-05-12 LAB — HEPATIC FUNCTION PANEL
ALT: 16 U/L (ref 0–35)
AST: 22 U/L (ref 0–37)
Albumin: 4.1 g/dL (ref 3.5–5.2)
Alkaline Phosphatase: 115 U/L (ref 39–117)
BILIRUBIN DIRECT: 0 mg/dL (ref 0.0–0.3)
BILIRUBIN TOTAL: 0.7 mg/dL (ref 0.2–1.2)
Total Protein: 7.1 g/dL (ref 6.0–8.3)

## 2014-05-12 LAB — CBC WITH DIFFERENTIAL/PLATELET
BASOS PCT: 0.2 % (ref 0.0–3.0)
Basophils Absolute: 0 10*3/uL (ref 0.0–0.1)
EOS PCT: 3.8 % (ref 0.0–5.0)
Eosinophils Absolute: 0.3 10*3/uL (ref 0.0–0.7)
HCT: 37.3 % (ref 36.0–46.0)
Hemoglobin: 12 g/dL (ref 12.0–15.0)
LYMPHS PCT: 17.3 % (ref 12.0–46.0)
Lymphs Abs: 1.4 10*3/uL (ref 0.7–4.0)
MCHC: 32.3 g/dL (ref 30.0–36.0)
MCV: 82.6 fl (ref 78.0–100.0)
MONO ABS: 0.6 10*3/uL (ref 0.1–1.0)
Monocytes Relative: 7.9 % (ref 3.0–12.0)
NEUTROS PCT: 70.8 % (ref 43.0–77.0)
Neutro Abs: 5.6 10*3/uL (ref 1.4–7.7)
PLATELETS: 116 10*3/uL — AB (ref 150.0–400.0)
RBC: 4.52 Mil/uL (ref 3.87–5.11)
RDW: 18.2 % — ABNORMAL HIGH (ref 11.5–15.5)
WBC: 7.9 10*3/uL (ref 4.0–10.5)

## 2014-05-12 LAB — SEDIMENTATION RATE: SED RATE: 18 mm/h (ref 0–22)

## 2014-05-12 LAB — BASIC METABOLIC PANEL
BUN: 25 mg/dL — AB (ref 6–23)
CHLORIDE: 103 meq/L (ref 96–112)
CO2: 30 mEq/L (ref 19–32)
Calcium: 9.6 mg/dL (ref 8.4–10.5)
Creatinine, Ser: 0.9 mg/dL (ref 0.4–1.2)
GFR: 63.62 mL/min (ref >60.00)
GLUCOSE: 100 mg/dL — AB (ref 70–99)
POTASSIUM: 5 meq/L (ref 3.5–5.1)
SODIUM: 141 meq/L (ref 135–145)

## 2014-05-12 LAB — TSH: TSH: 2.61 u[IU]/mL (ref 0.35–4.50)

## 2014-05-12 MED ORDER — HYDROCHLOROTHIAZIDE 12.5 MG PO TABS: ORAL_TABLET | ORAL | Status: DC

## 2014-05-12 MED ORDER — TRAZODONE HCL 50 MG PO TABS: ORAL_TABLET | ORAL | Status: DC

## 2014-05-12 MED ORDER — LISINOPRIL 20 MG PO TABS: ORAL_TABLET | ORAL | Status: DC

## 2014-05-12 MED ORDER — OMEPRAZOLE 40 MG PO CPDR: DELAYED_RELEASE_CAPSULE | ORAL | Status: DC

## 2014-05-12 MED ORDER — TRAMADOL HCL 50 MG PO TABS: 50.0000 mg | ORAL_TABLET | Freq: Once | ORAL | Status: DC | PRN

## 2014-05-12 NOTE — Patient Instructions (Signed)
Dysphagia Swallowing problems (dysphagia) occur when solids and liquids seem to stick in your throat on the way down to your stomach, or the food takes longer to get to the stomach. Other symptoms include regurgitating food, noises coming from the throat, chest discomfort with swallowing, and a feeling of fullness or the feeling of something being stuck in your throat when swallowing. When blockage in your throat is complete it may be associated with drooling. CAUSES  Problems with swallowing may occur because of problems with the muscles. The food cannot be propelled in the usual manner into your stomach. You may have ulcers, scar tissue, or inflammation in the tube down which food travels from your mouth to your stomach (esophagus), which blocks food from passing normally into the stomach. Causes of inflammation include:  Acid reflux from your stomach into your esophagus.  Infection.  Radiation treatment for cancer.  Medicines taken without enough fluids to wash them down into your stomach. You may have nerve problems that prevent signals from being sent to the muscles of your esophagus to contract and move your food down to your stomach. Globus pharyngeus is a relatively common problem in which there is a sense of an obstruction or difficulty in swallowing, without any physical abnormalities of the swallowing passages being found. This problem usually improves over time with reassurance and testing to rule out other causes. DIAGNOSIS Dysphagia can be diagnosed and its cause can be determined by tests in which you swallow a white substance that helps illuminate the inside of your throat (contrast medium) while X-rays are taken. Sometimes a flexible telescope that is inserted down your throat (endoscopy) to look at your esophagus and stomach is used. TREATMENT   If the dysphagia is caused by acid reflux or infection, medicines may be used.  If the dysphagia is caused by problems with your  swallowing muscles, swallowing therapy may be used to help you strengthen your swallowing muscles.  If the dysphagia is caused by a blockage or mass, procedures to remove the blockage may be done. HOME CARE INSTRUCTIONS  Try to eat soft food that is easier to swallow and check your weight on a daily basis to be sure that it is not decreasing.  Be sure to drink liquids when sitting upright (not lying down). SEEK MEDICAL CARE IF:  You are losing weight because you are unable to swallow.  You are coughing when you drink liquids (aspiration).  You are coughing up partially digested food. SEEK IMMEDIATE MEDICAL CARE IF:  You are unable to swallow your own saliva .  You are having shortness of breath or a fever, or both.  You have a hoarse voice along with difficulty swallowing. MAKE SURE YOU:  Understand these instructions.  Will watch your condition.  Will get help right away if you are not doing well or get worse. Document Released: 11/23/2000 Document Revised: 07/29/2013 Document Reviewed: 05/15/2013 [facility] Patient Information 2014 [place].  Reactive Arthritis Reactive arthritis (formerly known as Reiter's syndrome) is a group of illnesses that involves redness, soreness, and swelling (inflammation) of the joints, genital tract, and eyes. It is most common in males between the ages of 78 and 43. Reiter's syndrome is one specific type of reactive arthritis. CAUSES   This condition may follow food poisoning caused by a bacterial infection of Salmonella, Shigella, Camphylobacter, or Yersinia.  This condition can also follow an infection of the sexually transmitted disease (STD) chlamydia.  Having a certain gene may make you more  prone to develop reactive arthritis (HLA-B27 gene). SYMPTOMS   Inflammation of the joints, especially large joints. Hip, knee, and ankle pain are common.  Low back or foot pain.  Thick, crusty, reddish-purple sores on the palms of the hands  and soles of the feet.  Low-grade fever.  Frequent or painful urination (dysuria).  Genital sores, which may be painful and can become infected.  Pelvic pain.  Blurred vision, eye pain, and red, sore eyes (conjunctivitis). Eyelids may stick together in the morning.  Sores in the mouth. These may be painful or painless. DIAGNOSIS  The diagnosis is based on your symptoms. This process may be delayed because the symptoms may occur at different times. A history and physical exam may help suggest the cause of the arthritis. Blood tests may be done to rule out other forms of arthritis or to see if you have the HLA-B27 gene. Joint X-rays and urine tests (urinalysis) may also be done. TREATMENT  The goal of treatment is to relieve symptoms and to treat any underlying infection.  Medicines that kill germs (antibiotics) are often given to treat an initial infection, if found. However, they may not stop reactive arthritis from developing. It is best for your sexual partners to be treated with antibiotics as well, even if they have no symptoms and do not test positive for an STD. Reactive arthritis itself is not sexually transmitted and cannot be passed from person-to-person (noncontagious), but an infection that triggers it may be passed from person-to-person.  Nonsteroidal anti-inflammatory drugs (NSAIDs) are often used to treat reactive arthritis. These medicines reduce pain and swelling of the joints and decrease stiffness. However, they do not prevent further joint damage.  Eye problems and skin sores will go away on their own.  Long-term (chronic) reactive arthritis may need to be treated with a disease-modifying antirheumatic drug (DMARD), such as sulfasalazine or methotrexate. These medicines take longer to become fully effective but can cause a decrease in symptoms (remission).  In some cases, very inflamed joints may be injected with corticosteroids to reduce inflammation. PREVENTION   Preventing STDs and gastrointestinal infection may help prevent this disease. Wearing a condom during sexual intercourse can reduce your risk of STDs. HOME CARE INSTRUCTIONS   Eat a well-balanced diet. This is helpful in almost all disease states.  Exercise regularly. This will help maintain muscle strength. This helps your joints stay aligned and have less pain. Low-impact exercises, such as swimming, walking, water aerobics, and bicycling, can reduce pain and help maintain strength and flexibility.  Put ice on the sore joint.  Put ice in a plastic bag.  Place a towel between your skin and the bag.  Leave the ice on for 15-20 minutes at a time, 03-04 times a day.  You may also use a warm heating pad as directed by your caregiver.  Alternate heat and cold. Cold is usually best following exercise. Heat is best for warming up prior to exercising or using your joints.  Do not sleep with a heating pad.If you are diabetic, do not use a heating pad unless instructed to do so. SEEK MEDICAL CARE IF:   You develop hot, swollen joints which are getting worse.  You have an oral temperature above 102 F (38.9 C).  Any of your symptoms seem to be getting worse rather than better. FOR MORE INFORMATION  American College of Rheumatology: www.rheumatology.org Document Released: 10/24/2005 Document Revised: 02/18/2012 Document Reviewed: 03/13/2010 [facility] Patient Information 2014 [place].

## 2014-05-12 NOTE — Progress Notes (Signed)
Subjective:    Patient ID: Tiffany [name], female    DOB: August 27, 1929, 80 y.o.   MRN: [id number]  HPI  80 year old caucasian female presents with three acute issues. She describes painful joints and swelling in her fingers and also pain on the tops of her feet for the past 2-3 months. She also reports weight loss which she cannot attribute to any changes in appetite, medications, or activity level.  She describes dysphagia with some food intake over the last 6-8 weeks. This does not occur with every meal.  She takes omeprazole daily for GERD.   Specifically denied are loss of appetite, abdominal bloating, changes in bowel habits, bloody or tarry stools, or nausea/vomiting.   Review of Systems  Constitutional: Positive for unexpected weight change. Negative for fever, chills, diaphoresis, activity change, appetite change and fatigue.  HENT: Negative.   Eyes: Negative.   Respiratory: Negative.   Cardiovascular: Negative.   Gastrointestinal: Negative for nausea, vomiting, abdominal pain, diarrhea, constipation, blood in stool, abdominal distention, anal bleeding and rectal pain.       Dysphagia intermittent for 6-8 weeks. Elicits regurgitation at times.    Genitourinary: Negative.   Musculoskeletal: Positive for joint swelling.       Joint stiffness in hands and feet; swelling in fingers bilaterally   Skin:       Diffuse scaly keratosis patches on face and arms.   Allergic/Immunologic: Negative.   Hematological: Negative.   Psychiatric/Behavioral: Negative.    Past Medical History  Diagnosis Date  . Hypertension   . Arthritis     History   Social History  . Marital Status: Married    Spouse Name: Tiffany [name]    Number of Children: 1  . Years of Education: Tiffany [name]   Occupational History  . Retired    Social History Main Topics  . Smoking status: Never Smoker   . Smokeless tobacco: Never Used  . Alcohol Use: Yes     Comment: glass of wine per day  . Drug Use: No  . Sexual  Activity: Not on file   Other Topics Concern  . Not on file   Social History Narrative  . No narrative on file    Past Surgical History  Procedure Laterality Date  . Back surgery  09/25/2011  . Abdominal hysterectomy      No family history on file.  No Known Allergies  Current Outpatient Prescriptions on File Prior to Visit  Medication Sig Dispense Refill  . aspirin 81 MG tablet Take 81 mg by mouth daily.      . benzonatate (TESSALON) 200 MG capsule       . CRANBERRY PO Take 3,600 mg by mouth daily.      Marland Kitchen ipratropium (ATROVENT) 0.03 % nasal spray       . loratadine (CLARITIN) 10 MG tablet Take 1 tablet (10 mg total) by mouth daily.  90 tablet  1  . Multiple Vitamin (MULTIVITAMIN) tablet Take 1 tablet by mouth daily.      . Multiple Vitamins-Minerals (MACUVITE EYE CARE) TABS Take 1 tablet by mouth daily.       No current facility-administered medications on file prior to visit.    BP 120/68  Pulse 68  Temp(Src) 98.5 F (36.9 C) (Oral)  Wt 135 lb (61.236 kg)chart     Objective:   Physical Exam  Constitutional: No distress.  Thin, frail   HENT:  Head: Normocephalic and atraumatic.  Neck: Normal range of motion. No  thyromegaly present.  Cardiovascular: Normal rate, regular rhythm and normal heart sounds.  Exam reveals no gallop and no friction rub.   No murmur heard. Pulmonary/Chest: Effort normal and breath sounds normal.  Abdominal: Soft. Bowel sounds are normal. She exhibits no distension and no mass. There is no tenderness. There is no rebound and no guarding.  Musculoskeletal: She exhibits edema.       Feet:  PIP and DIP joint changes bilaterally   Lymphadenopathy:    She has no cervical adenopathy.  Skin: She is not diaphoretic.       Assessment & Plan:  Tiffany [name] was seen today for edema.  Diagnoses and associated orders for this visit:  Arthritis pain - Sedimentation Rate - Rheumatoid Factor  Loss of weight - CBC with Differential - Basic  Metabolic Panel - TSH - Hepatic Function Panel - Ambulatory referral to Gastroenterology  Dysphagia, unspecified(787.20) - Ambulatory referral to Gastroenterology  Other Orders - traMADol (ULTRAM) 50 MG tablet; Take 1 tablet (50 mg total) by mouth once as needed. - traZODone (DESYREL) 50 MG tablet; TAKE 0.5-1 TABLETS (25-50 MG TOTAL) BY MOUTH AT BEDTIME AS NEEDED FORSLEEP. - lisinopril (PRINIVIL,ZESTRIL) 20 MG tablet; TAKE 1 TABLET EVERY DAY - hydrochlorothiazide (HYDRODIURIL) 12.5 MG tablet; TAKE 1 TABLET EVERY DAY - omeprazole (PRILOSEC) 40 MG capsule; TAKE 1 CAPSULE EVERY DAY

## 2014-05-12 NOTE — Progress Notes (Deleted)
   Subjective:    Patient ID: Tiffany [name], female    DOB: 1929-07-25, 80 y.o.   MRN: [id number]  HPI    Review of Systems     Objective:   Physical Exam        Assessment & Plan:

## 2014-05-12 NOTE — Progress Notes (Signed)
Pre visit review using our clinic review tool, if applicable. No additional management support is needed unless otherwise documented below in the visit note. 

## 2014-05-14 LAB — RHEUMATOID FACTOR: Rhuematoid fact SerPl-aCnc: <10 IU/mL (ref <=14)

## 2014-06-30 ENCOUNTER — Encounter: Payer: Self-pay | Admitting: Family

## 2014-06-30 ENCOUNTER — Ambulatory Visit (INDEPENDENT_AMBULATORY_CARE_PROVIDER_SITE_OTHER): Payer: Commercial Managed Care - HMO | Admitting: Family

## 2014-06-30 VITALS — BP 122/68 | HR 67 | Temp 98.3°F | Ht 64.0 in | Wt 133.0 lb

## 2014-06-30 DIAGNOSIS — F32A Depression, unspecified: Secondary | ICD-10-CM

## 2014-06-30 DIAGNOSIS — D489 Neoplasm of uncertain behavior, unspecified: Secondary | ICD-10-CM

## 2014-06-30 DIAGNOSIS — F329 Major depressive disorder, single episode, unspecified: Secondary | ICD-10-CM

## 2014-06-30 DIAGNOSIS — M1610 Unilateral primary osteoarthritis, unspecified hip: Secondary | ICD-10-CM

## 2014-06-30 DIAGNOSIS — F3289 Other specified depressive episodes: Secondary | ICD-10-CM

## 2014-06-30 DIAGNOSIS — M1612 Unilateral primary osteoarthritis, left hip: Secondary | ICD-10-CM

## 2014-06-30 DIAGNOSIS — F411 Generalized anxiety disorder: Secondary | ICD-10-CM

## 2014-06-30 DIAGNOSIS — R413 Other amnesia: Secondary | ICD-10-CM

## 2014-06-30 MED ORDER — DULOXETINE HCL 30 MG PO CPEP: 30.0000 mg | ORAL_CAPSULE | Freq: Every day | ORAL | Status: DC

## 2014-06-30 NOTE — Progress Notes (Signed)
Pre visit review using our clinic review tool, if applicable. No additional management support is needed unless otherwise documented below in the visit note. 

## 2014-06-30 NOTE — Patient Instructions (Addendum)
1. B12 High Potency once daily.  Depression, Adult Depression refers to feeling sad, low, down in the dumps, blue, gloomy, or empty. In general, there are two kinds of depression: 1. Depression that we all experience from time to time because of upsetting life experiences, including the loss of a job or the ending of a relationship (normal sadness or normal grief). This kind of depression is considered normal, is short lived, and resolves within a few days to 2 weeks. (Depression experienced after the loss of a loved one is called bereavement. Bereavement often lasts longer than 2 weeks but normally gets better with time.) 2. Clinical depression, which lasts longer than normal sadness or normal grief or interferes with your ability to function at home, at work, and in school. It also interferes with your personal relationships. It affects almost every aspect of your life. Clinical depression is an illness. Symptoms of depression also can be caused by conditions other than normal sadness and grief or clinical depression. Examples of these conditions are listed as follows:  Physical illness--Some physical illnesses, including underactive thyroid gland (hypothyroidism), severe anemia, specific types of cancer, diabetes, uncontrolled seizures, heart and lung problems, strokes, and chronic pain are commonly associated with symptoms of depression.  Side effects of some prescription medicine--In some people, certain types of prescription medicine can cause symptoms of depression.  Substance abuse--Abuse of alcohol and illicit drugs can cause symptoms of depression. SYMPTOMS Symptoms of normal sadness and normal grief include the following:  Feeling sad or crying for short periods of time.  Not caring about anything (apathy).  Difficulty sleeping or sleeping too much.  No longer able to enjoy the things you used to enjoy.  Desire to be by oneself all the time (social isolation).  Lack of energy or  motivation.  Difficulty concentrating or remembering.  Change in appetite or weight.  Restlessness or agitation. Symptoms of clinical depression include the same symptoms of normal sadness or normal grief and also the following symptoms:  Feeling sad or crying all the time.  Feelings of guilt or worthlessness.  Feelings of hopelessness or helplessness.  Thoughts of suicide or the desire to harm yourself (suicidal ideation).  Loss of touch with reality (psychotic symptoms). Seeing or hearing things that are not real (hallucinations) or having false beliefs about your life or the people around you (delusions and paranoia). DIAGNOSIS  The diagnosis of clinical depression usually is based on the severity and duration of the symptoms. Your caregiver also will ask you questions about your medical history and substance use to find out if physical illness, use of prescription medicine, or substance abuse is causing your depression. Your caregiver also may order blood tests. TREATMENT  Typically, normal sadness and normal grief do not require treatment. However, sometimes antidepressant medicine is prescribed for bereavement to ease the depressive symptoms until they resolve. The treatment for clinical depression depends on the severity of your symptoms but typically includes antidepressant medicine, counseling with a mental health professional, or a combination of both. Your caregiver will help to determine what treatment is best for you. Depression caused by physical illness usually goes away with appropriate medical treatment of the illness. If prescription medicine is causing depression, talk with your caregiver about stopping the medicine, decreasing the dose, or substituting another medicine. Depression caused by abuse of alcohol or illicit drugs abuse goes away with abstinence from these substances. Some adults need professional help in order to stop drinking or using drugs. SEEK IMMEDIATE  CARE IF:  You have thoughts about hurting yourself or others.  You lose touch with reality (have psychotic symptoms).  You are taking medicine for depression and have a serious side effect. FOR MORE INFORMATION National Alliance on Mental Illness: www.nami.[employer]: https://carter.com/ Document Released: 11/23/2000 Document Revised: 05/27/2012 Document Reviewed: 02/25/2012 [facility] Patient Information 2015 [place], [place]. This information is not intended to replace advice given to you by your health care provider. Make sure you discuss any questions you have with your health care provider.

## 2014-06-30 NOTE — Progress Notes (Signed)
Subjective:    Patient ID: Tiffany [name], female    DOB: 07-28-1929, 80 y.o.   MRN: [id number]  Groin Pain  Anxiety Symptoms include decreased concentration and nervous/anxious behavior.    Arthritis   79 year old white female, with a history of hypertension, hyperlipidemia, and osteoarthritis is in today with complaints of left hip pain ongoing for several months. Pain is worse with walking. Has been taking Tylenol arthritis without much relief. Has a history of osteoarthritis in her left hip confirmed with x-ray.  Patient is concerned this become more anxious and depressed worried about her husband who is aging. Reports some memory loss. Has episodes of crying and feeling down. Is requesting to try Cymbalta.   Review of Systems  Constitutional: Negative.   HENT: Negative.   Respiratory: Negative.   Cardiovascular: Negative.   Gastrointestinal: Negative.   Genitourinary: Negative.   Musculoskeletal: Positive for arthralgias and arthritis.       Left hip pain  Skin: Negative.   Allergic/Immunologic: Negative.   Neurological: Negative.   Psychiatric/Behavioral: Positive for sleep disturbance, decreased concentration and agitation. The patient is nervous/anxious.    Past Medical History  Diagnosis Date  . Hypertension   . Arthritis     History   Social History  . Marital Status: Married    Spouse Name: N/A    Number of Children: 1  . Years of Education: N/A   Occupational History  . Retired    Social History Main Topics  . Smoking status: Never Smoker   . Smokeless tobacco: Never Used  . Alcohol Use: Yes     Comment: glass of wine per day  . Drug Use: No  . Sexual Activity: Not on file   Other Topics Concern  . Not on file   Social History Narrative  . No narrative on file    Past Surgical History  Procedure Laterality Date  . Back surgery  09/25/2011  . Abdominal hysterectomy      No family history on file.  No Known Allergies  Current  Outpatient Prescriptions on File Prior to Visit  Medication Sig Dispense Refill  . aspirin 81 MG tablet Take 81 mg by mouth daily.      . benzonatate (TESSALON) 200 MG capsule       . CRANBERRY PO Take 3,600 mg by mouth daily.      . hydrochlorothiazide (HYDRODIURIL) 12.5 MG tablet TAKE 1 TABLET EVERY DAY  90 tablet  PRN  . ipratropium (ATROVENT) 0.03 % nasal spray       . lisinopril (PRINIVIL,ZESTRIL) 20 MG tablet TAKE 1 TABLET EVERY DAY  90 tablet  PRN  . loratadine (CLARITIN) 10 MG tablet Take 1 tablet (10 mg total) by mouth daily.  90 tablet  1  . Multiple Vitamin (MULTIVITAMIN) tablet Take 1 tablet by mouth daily.      . Multiple Vitamins-Minerals (MACUVITE EYE CARE) TABS Take 1 tablet by mouth daily.      Marland Kitchen omeprazole (PRILOSEC) 40 MG capsule TAKE 1 CAPSULE EVERY DAY  90 capsule  1  . traZODone (DESYREL) 50 MG tablet TAKE 0.5-1 TABLETS (25-50 MG TOTAL) BY MOUTH AT BEDTIME AS NEEDED FORSLEEP.  30 tablet  2   No current facility-administered medications on file prior to visit.    BP 122/68  Pulse 67  Temp(Src) 98.3 F (36.8 C) (Oral)  Ht 5\' 4"  (1.626 m)  Wt 133 lb (60.328 kg)  BMI 22.82 kg/m2chart    Objective:  Physical Exam  Constitutional: She is oriented to person, place, and time. She appears well-developed and well-nourished.  HENT:  Right Ear: External ear normal.  Left Ear: External ear normal.  Nose: Nose normal.  Mouth/Throat: Oropharynx is clear and moist.  Eyes: Pupils are equal, round, and reactive to light.  Neck: Normal range of motion. Neck supple. No thyromegaly present.  Cardiovascular: Normal rate, regular rhythm and normal heart sounds.   Pulmonary/Chest: Effort normal and breath sounds normal.  Abdominal: Soft. Bowel sounds are normal.  Musculoskeletal: Normal range of motion.  Neurological: She is alert and oriented to person, place, and time. She has normal reflexes. She displays normal reflexes. No cranial nerve deficit. Coordination normal.  Skin:  Skin is warm and dry.  Psychiatric: She has a normal mood and affect.          Assessment & Plan:  [name] was seen today for groin pain, memory loss, anxiety, depression and arthritis.  Diagnoses and associated orders for this visit:  Depression  Generalized anxiety disorder  Primary osteoarthritis of left hip  Memory loss  Neoplasm of uncertain behavior - Ambulatory referral to Dermatology  Other Orders - DULoxetine (CYMBALTA) 30 MG capsule; Take 1 capsule (30 mg total) by mouth daily.    recheck in 3 weeks and sooner as needed. Continue Tylenol arthritis over-the-counter. Tylenol as needed for pain.

## 2014-07-23 ENCOUNTER — Ambulatory Visit (INDEPENDENT_AMBULATORY_CARE_PROVIDER_SITE_OTHER): Payer: Commercial Managed Care - HMO | Admitting: Family

## 2014-07-23 ENCOUNTER — Encounter: Payer: Self-pay | Admitting: Family

## 2014-07-23 VITALS — BP 124/60 | HR 80 | Ht 64.0 in | Wt 132.0 lb

## 2014-07-23 DIAGNOSIS — F3289 Other specified depressive episodes: Secondary | ICD-10-CM

## 2014-07-23 DIAGNOSIS — I10 Essential (primary) hypertension: Secondary | ICD-10-CM

## 2014-07-23 DIAGNOSIS — F329 Major depressive disorder, single episode, unspecified: Secondary | ICD-10-CM

## 2014-07-23 DIAGNOSIS — G4700 Insomnia, unspecified: Secondary | ICD-10-CM

## 2014-07-23 DIAGNOSIS — F32A Depression, unspecified: Secondary | ICD-10-CM

## 2014-07-23 MED ORDER — LISINOPRIL 20 MG PO TABS: ORAL_TABLET | ORAL | Status: DC

## 2014-07-23 MED ORDER — HYDROCHLOROTHIAZIDE 12.5 MG PO TABS: ORAL_TABLET | ORAL | Status: DC

## 2014-07-23 NOTE — Patient Instructions (Signed)
Cardiac Diet This diet can help prevent heart disease and stroke. Many factors influence your heart health, including eating and exercise habits. Coronary risk rises a lot with abnormal blood fat (lipid) levels. Cardiac meal planning includes limiting unhealthy fats, increasing healthy fats, and making other small dietary changes. General guidelines are as follows:  Adjust calorie intake to reach and maintain desirable body weight.  Limit total fat intake to less than 30% of total calories. Saturated fat should be less than 7% of calories.  Saturated fats are found in animal products and in some vegetable products. Saturated vegetable fats are found in coconut oil, cocoa butter, palm oil, and palm kernel oil. Read labels carefully to avoid these products as much as possible. Use butter in moderation. Choose tub margarines and oils that have 2 grams of fat or less. Good cooking oils are canola and olive oils.  Practice low-fat cooking techniques. Do not fry food. Instead, broil, bake, boil, steam, grill, roast on a rack, stir-fry, or microwave it. Other fat reducing suggestions include:  Remove the skin from poultry.  Remove all visible fat from meats.  Skim the fat off stews, soups, and gravies before serving them.  Steam vegetables in water or broth instead of sauting them in fat.  Avoid foods with trans fat (or hydrogenated oils), such as commercially fried foods and commercially baked goods. Commercial shortening and deep-frying fats will contain trans fat.  Increase intake of fruits, vegetables, whole grains, and legumes to replace foods high in fat.  Increase consumption of nuts, legumes, and seeds to at least 4 servings weekly. One serving of a legume equals  cup, and 1 serving of nuts or seeds equals  cup.  Choose whole grains more often. Have 3 servings per day (a serving is 1 ounce [oz]).  Eat 4 to 5 servings of vegetables per day. A serving of vegetables is 1 cup of raw leafy  vegetables;  cup of raw or cooked cut-up vegetables;  cup of vegetable juice.  Eat 4 to 5 servings of fruit per day. A serving of fruit is 1 medium whole fruit;  cup of dried fruit;  cup of fresh, frozen, or canned fruit;  cup of 100% fruit juice.  Increase your intake of dietary fiber to 20 to 30 grams per day. Insoluble fiber may help lower your risk of heart disease and may help curb your appetite.  Soluble fiber binds cholesterol to be removed from the blood. Foods high in soluble fiber are dried beans, citrus fruits, oats, apples, bananas, broccoli, Brussels sprouts, and eggplant.  Try to include foods fortified with plant sterols or stanols, such as yogurt, breads, juices, or margarines. Choose several fortified foods to achieve a daily intake of 2 to 3 grams of plant sterols or stanols.  Foods with omega-3 fats can help reduce your risk of heart disease. Aim to have a 3.5 oz portion of fatty fish twice per week, such as salmon, mackerel, albacore tuna, sardines, lake trout, or herring. If you wish to take a fish oil supplement, choose one that contains 1 gram of both DHA and EPA.  Limit processed meats to 2 servings (3 oz portion) weekly.  Limit the sodium in your diet to 1500 milligrams (mg) per day. If you have high blood pressure, talk to a registered dietitian about a DASH (Dietary Approaches to Stop Hypertension) eating plan.  Limit sweets and beverages with added sugar, such as soda, to no more than 5 servings per week. One   serving is:   1 tablespoon sugar.  1 tablespoon jelly or jam.   cup sorbet.  1 cup lemonade.   cup regular soda. CHOOSING FOODS Starches  Allowed: Breads: All kinds (wheat, rye, raisin, white, oatmeal, Italian, French, and English muffin bread). Low-fat rolls: English muffins, frankfurter and hamburger buns, bagels, pita bread, tortillas (not fried). Pancakes, waffles, biscuits, and muffins made with recommended oil.  Avoid: Products made with  saturated or trans fats, oils, or whole milk products. Butter rolls, cheese breads, croissants. Commercial doughnuts, muffins, sweet rolls, biscuits, waffles, pancakes, store-bought mixes. Crackers  Allowed: Low-fat crackers and snacks: Animal, graham, rye, saltine (with recommended oil, no lard), oyster, and matzo crackers. Bread sticks, melba toast, rusks, flatbread, pretzels, and light popcorn.  Avoid: High-fat crackers: cheese crackers, butter crackers, and those made with coconut, palm oil, or trans fat (hydrogenated oils). Buttered popcorn. Cereals  Allowed: Hot or cold whole-grain cereals.  Avoid: Cereals containing coconut, hydrogenated vegetable fat, or animal fat. Potatoes / Pasta / Rice  Allowed: All kinds of potatoes, rice, and pasta (such as macaroni, spaghetti, and noodles).  Avoid: Pasta or rice prepared with cream sauce or high-fat cheese. Chow mein noodles, French fries. Vegetables  Allowed: All vegetables and vegetable juices.  Avoid: Fried vegetables. Vegetables in cream, butter, or high-fat cheese sauces. Limit coconut. Fruit in cream or custard. Protein  Allowed: Limit your intake of meat, seafood, and poultry to no more than 6 oz (cooked weight) per day. All lean, well-trimmed beef, veal, pork, and lamb. All chicken and turkey without skin. All fish and shellfish. Wild game: wild duck, rabbit, pheasant, and venison. Egg whites or low-cholesterol egg substitutes may be used as desired. Meatless dishes: recipes with dried beans, peas, lentils, and tofu (soybean curd). Seeds and nuts: all seeds and most nuts.  Avoid: Prime grade and other heavily marbled and fatty meats, such as short ribs, spare ribs, rib eye roast or steak, frankfurters, sausage, bacon, and high-fat luncheon meats, mutton. Caviar. Commercially fried fish. Domestic duck, goose, venison sausage. Organ meats: liver, gizzard, heart, chitterlings, brains, kidney, sweetbreads. Dairy  Allowed: Low-fat  cheeses: nonfat or low-fat cottage cheese (1% or 2% fat), cheeses made with part skim milk, such as mozzarella, farmers, string, or ricotta. (Cheeses should be labeled no more than 2 to 6 grams fat per oz.). Skim (or 1%) milk: liquid, powdered, or evaporated. Buttermilk made with low-fat milk. Drinks made with skim or low-fat milk or cocoa. Chocolate milk or cocoa made with skim or low-fat (1%) milk. Nonfat or low-fat yogurt.  Avoid: Whole milk cheeses, including colby, cheddar, muenster, Monterey Jack, Havarti, Brie, Camembert, American, Swiss, and blue. Creamed cottage cheese, cream cheese. Whole milk and whole milk products, including buttermilk or yogurt made from whole milk, drinks made from whole milk. Condensed milk, evaporated whole milk, and 2% milk. Soups and Combination Foods  Allowed: Low-fat low-sodium soups: broth, dehydrated soups, homemade broth, soups with the fat removed, homemade cream soups made with skim or low-fat milk. Low-fat spaghetti, lasagna, chili, and Spanish rice if low-fat ingredients and low-fat cooking techniques are used.  Avoid: Cream soups made with whole milk, cream, or high-fat cheese. All other soups. Desserts and Sweets  Allowed: Sherbet, fruit ices, gelatins, meringues, and angel food cake. Homemade desserts with recommended fats, oils, and milk products. Jam, jelly, honey, marmalade, sugars, and syrups. Pure sugar candy, such as gum drops, hard candy, jelly beans, marshmallows, mints, and small amounts of dark chocolate.  Avoid: Commercially prepared   cakes, pies, cookies, frosting, pudding, or mixes for these products. Desserts containing whole milk products, chocolate, coconut, lard, palm oil, or palm kernel oil. Ice cream or ice cream drinks. Candy that contains chocolate, coconut, butter, hydrogenated fat, or unknown ingredients. Buttered syrups. Fats and Oils  Allowed: Vegetable oils: safflower, sunflower, corn, soybean, cottonseed, sesame, canola, olive,  or peanut. Non-hydrogenated margarines. Salad dressing or mayonnaise: homemade or commercial, made with a recommended oil. Low or nonfat salad dressing or mayonnaise.  Limit added fats and oils to 6 to 8 tsp per day (includes fats used in cooking, baking, salads, and spreads on bread). Remember to count the "hidden fats" in foods.  Avoid: Solid fats and shortenings: butter, lard, salt pork, bacon drippings. Gravy containing meat fat, shortening, or suet. Cocoa butter, coconut. Coconut oil, palm oil, palm kernel oil, or hydrogenated oils: these ingredients are often used in bakery products, nondairy creamers, whipped toppings, candy, and commercially fried foods. Read labels carefully. Salad dressings made of unknown oils, sour cream, or cheese, such as blue cheese and Roquefort. Cream, all kinds: half-and-half, light, heavy, or whipping. Sour cream or cream cheese (even if "light" or low-fat). Nondairy cream substitutes: coffee creamers and sour cream substitutes made with palm, palm kernel, hydrogenated oils, or coconut oil. Beverages  Allowed: Coffee (regular or decaffeinated), tea. Diet carbonated beverages, mineral water. Alcohol: Check with your caregiver. Moderation is recommended.  Avoid: Whole milk, regular sodas, and juice drinks with added sugar. Condiments  Allowed: All seasonings and condiments. Cocoa powder. "Cream" sauces made with recommended ingredients.  Avoid: Carob powder made with hydrogenated fats. SAMPLE MENU Breakfast   cup orange juice   cup oatmeal  1 slice toast  1 tsp margarine  1 cup skim milk Lunch  Turkey sandwich with 2 oz turkey, 2 slices bread  Lettuce and tomato slices  Fresh fruit  Carrot sticks  Coffee or tea Snack  Fresh fruit or low-fat crackers Dinner  3 oz lean ground beef  1 baked potato  1 tsp margarine   cup asparagus  Lettuce salad  1 tbs non-creamy dressing   cup peach slices  1 cup skim milk Document Released:  09/04/2008 Document Revised: 05/27/2012 Document Reviewed: 01/26/2014 [employer] Patient Information 2015 [employer]. This information is not intended to replace advice given to you by your health care provider. Make sure you discuss any questions you have with your health care provider.  

## 2014-07-23 NOTE — Progress Notes (Signed)
Pre visit review using our clinic review tool, if applicable. No additional management support is needed unless otherwise documented below in the visit note. 

## 2014-07-23 NOTE — Progress Notes (Signed)
Subjective:    Patient ID: Tiffany [name], female    DOB: 1929/12/09, 80 y.o.   MRN: [id number]  HPI 80 year old white female, nonsmoker is in today for recheck of depression. She is currently on Cymbalta 30 mg once daily and tolerating it well. Denies any concerns. Feels that her mood is better. Denies any feelings of helplessness, hopelessness, thoughts andof death or dying.    Review of Systems  Constitutional: Negative.   HENT: Negative.   Respiratory: Negative.   Cardiovascular: Negative.   Gastrointestinal: Negative.   Endocrine: Negative.   Genitourinary: Negative.   Musculoskeletal: Negative.   Skin: Negative.   Allergic/Immunologic: Negative.   Psychiatric/Behavioral: Negative.    Past Medical History  Diagnosis Date  . Hypertension   . Arthritis     History   Social History  . Marital Status: Married    Spouse Name: N/A    Number of Children: 1  . Years of Education: N/A   Occupational History  . Retired    Social History Main Topics  . Smoking status: Never Smoker   . Smokeless tobacco: Never Used  . Alcohol Use: Yes     Comment: glass of wine per day  . Drug Use: No  . Sexual Activity: Not on file   Other Topics Concern  . Not on file   Social History Narrative  . No narrative on file    Past Surgical History  Procedure Laterality Date  . Back surgery  09/25/2011  . Abdominal hysterectomy      No family history on file.  No Known Allergies  Current Outpatient Prescriptions on File Prior to Visit  Medication Sig Dispense Refill  . aspirin 81 MG tablet Take 81 mg by mouth daily.      Marland Kitchen CRANBERRY PO Take 3,600 mg by mouth daily.      . DULoxetine (CYMBALTA) 30 MG capsule Take 1 capsule (30 mg total) by mouth daily.  30 capsule  3  . ipratropium (ATROVENT) 0.03 % nasal spray       . loratadine (CLARITIN) 10 MG tablet Take 1 tablet (10 mg total) by mouth daily.  90 tablet  1  . Multiple Vitamin (MULTIVITAMIN) tablet Take 1 tablet by  mouth daily.      Marland Kitchen omeprazole (PRILOSEC) 40 MG capsule TAKE 1 CAPSULE EVERY DAY  90 capsule  1  . traZODone (DESYREL) 50 MG tablet TAKE 0.5-1 TABLETS (25-50 MG TOTAL) BY MOUTH AT BEDTIME AS NEEDED FORSLEEP.  30 tablet  2   No current facility-administered medications on file prior to visit.    BP 124/60  Pulse 80  Ht 5\' 4"  (1.626 m)  Wt 132 lb (59.875 kg)  BMI 22.65 kg/m2chart    Objective:   Physical Exam  Constitutional: She is oriented to person, place, and time. She appears well-developed and well-nourished.  HENT:  Right Ear: External ear normal.  Left Ear: External ear normal.  Nose: Nose normal.  Mouth/Throat: Oropharynx is clear and moist.  Neck: Normal range of motion. Neck supple. No thyromegaly present.  Cardiovascular: Normal rate, regular rhythm and normal heart sounds.   Pulmonary/Chest: Effort normal and breath sounds normal.  Musculoskeletal: Normal range of motion.  Neurological: She is alert and oriented to person, place, and time.  Skin: Skin is warm and dry.  Psychiatric: She has a normal mood and affect.          Assessment & Plan:  Tiffany [name] was seen today for follow-up.  Diagnoses and associated orders for this visit:  Unspecified essential hypertension  Depression  Insomnia  Other Orders - hydrochlorothiazide (HYDRODIURIL) 12.5 MG tablet; TAKE 1 TABLET EVERY DAY - lisinopril (PRINIVIL,ZESTRIL) 20 MG tablet; TAKE 1 TABLET EVERY DAY   Call the office with any questions or concerns. Recheck in 4 months and sooner as needed.

## 2014-07-26 ENCOUNTER — Ambulatory Visit: Payer: Medicare PPO | Admitting: Gastroenterology

## 2014-08-13 ENCOUNTER — Other Ambulatory Visit: Payer: Self-pay | Admitting: Family

## 2014-08-18 ENCOUNTER — Other Ambulatory Visit: Payer: Self-pay

## 2014-08-18 NOTE — Telephone Encounter (Signed)
error 

## 2014-08-25 ENCOUNTER — Telehealth: Payer: Self-pay | Admitting: Family

## 2014-08-25 MED ORDER — DULOXETINE HCL 60 MG PO CPEP: 60.0000 mg | ORAL_CAPSULE | Freq: Every day | ORAL | Status: DC

## 2014-08-25 NOTE — Telephone Encounter (Signed)
Pt called to ask if the following rx can be increased to 50 mg. She said she is having a lot of pain. . DULoxetine (CYMBALTA) 30 MG capsule   Pharmacy ; [place]

## 2014-08-25 NOTE — Telephone Encounter (Signed)
Next dosage is 60 mg. Take extra strength tylenol as well. Recheck in 3 weeks.

## 2014-08-25 NOTE — Telephone Encounter (Signed)
Please advise 

## 2014-08-26 NOTE — Telephone Encounter (Signed)
Left a message for return call.  

## 2014-08-27 NOTE — Telephone Encounter (Signed)
Pt aware and has already picked up Rx.

## 2014-09-21 ENCOUNTER — Ambulatory Visit (INDEPENDENT_AMBULATORY_CARE_PROVIDER_SITE_OTHER): Payer: Commercial Managed Care - HMO | Admitting: Family

## 2014-09-21 ENCOUNTER — Encounter: Payer: Self-pay | Admitting: Family

## 2014-09-21 VITALS — BP 120/70 | HR 89 | Ht 64.0 in | Wt 132.0 lb

## 2014-09-21 DIAGNOSIS — R1310 Dysphagia, unspecified: Secondary | ICD-10-CM

## 2014-09-21 DIAGNOSIS — K219 Gastro-esophageal reflux disease without esophagitis: Secondary | ICD-10-CM

## 2014-09-21 MED ORDER — OMEPRAZOLE 40 MG PO CPDR: DELAYED_RELEASE_CAPSULE | ORAL | Status: DC

## 2014-09-21 NOTE — Patient Instructions (Signed)

## 2014-09-21 NOTE — Progress Notes (Signed)
Pre visit review using our clinic review tool, if applicable. No additional management support is needed unless otherwise documented below in the visit note. 

## 2014-09-21 NOTE — Progress Notes (Signed)
Subjective:    Patient ID: Tiffany [name], female    DOB: 06/15/1929, 80 y.o.   MRN: [id number]  Gastrophageal Reflux She reports no abdominal pain or no nausea.    80 year old white female, nonsmoker who is in today with complaint of difficulty swallowing and her husband reports has been going on for one month. He reports that she vomits approximately 2-3 times per week attempting to swallow food. Denies any particular foods causing the issue. He has a history of GERD and currently takes Prilosec 40 mg once daily. Denies any heartburn or indigestion.   Review of Systems  Constitutional: Negative.   HENT: Positive for trouble swallowing. Negative for congestion, drooling and voice change.   Respiratory: Negative.   Cardiovascular: Negative.   Gastrointestinal: Negative for nausea, abdominal pain, diarrhea and constipation.  Endocrine: Negative.   Genitourinary: Negative.   Musculoskeletal: Negative.   Skin: Negative.   Allergic/Immunologic: Negative.   Neurological: Negative.    Past Medical History  Diagnosis Date  . Hypertension   . Arthritis     History   Social History  . Marital Status: Married    Spouse Name: N/A    Number of Children: 1  . Years of Education: N/A   Occupational History  . Retired    Social History Main Topics  . Smoking status: Never Smoker   . Smokeless tobacco: Never Used  . Alcohol Use: Yes     Comment: glass of wine per day  . Drug Use: No  . Sexual Activity: Not on file   Other Topics Concern  . Not on file   Social History Narrative  . No narrative on file    Past Surgical History  Procedure Laterality Date  . Back surgery  09/25/2011  . Abdominal hysterectomy      No family history on file.  No Known Allergies  Current Outpatient Prescriptions on File Prior to Visit  Medication Sig Dispense Refill  . aspirin 81 MG tablet Take 81 mg by mouth daily.      Marland Kitchen CRANBERRY PO Take 3,600 mg by mouth daily.      .  DULoxetine (CYMBALTA) 60 MG capsule Take 1 capsule (60 mg total) by mouth daily.  30 capsule  3  . hydrochlorothiazide (HYDRODIURIL) 12.5 MG tablet TAKE 1 TABLET EVERY DAY  90 tablet  PRN  . ipratropium (ATROVENT) 0.03 % nasal spray       . lisinopril (PRINIVIL,ZESTRIL) 20 MG tablet TAKE 1 TABLET EVERY DAY  90 tablet  PRN  . loratadine (CLARITIN) 10 MG tablet Take 1 tablet (10 mg total) by mouth daily.  90 tablet  1  . Multiple Vitamin (MULTIVITAMIN) tablet Take 1 tablet by mouth daily.      . traZODone (DESYREL) 50 MG tablet TAKE 0.5-1 TABLETS (25-50 MG TOTAL) BY MOUTH AT BEDTIME AS NEEDED FORSLEEP.  30 tablet  3   No current facility-administered medications on file prior to visit.    BP 120/70  Pulse 89  Ht 5\' 4"  (1.626 m)  Wt 132 lb (59.875 kg)  BMI 22.65 kg/m2chart    Objective:is   Physical Exam  Constitutional: She is oriented to person, place, and time. She appears well-developed and well-nourished.  HENT:  Right Ear: External ear normal.  Left Ear: External ear normal.  Nose: Nose normal.  Mouth/Throat: Oropharynx is clear and moist.  Neck: Normal range of motion. Neck supple.  Cardiovascular: Normal rate, regular rhythm and normal heart sounds.  Pulmonary/Chest: Effort normal and breath sounds normal.  Abdominal: Soft. Bowel sounds are normal.  Neurological: She is alert and oriented to person, place, and time.  Skin: Skin is warm and dry.  Psychiatric: She has a normal mood and affect.          Assessment & Plan:  Tiffany [name] was seen today for gastrophageal reflux.  Diagnoses and associated orders for this visit:  Gastroesophageal reflux disease without esophagitis - Ambulatory referral to Gastroenterology - DG Esophagus; Future  Dysphagia - Ambulatory referral to Gastroenterology - DG Esophagus; Future  Other Orders - omeprazole (PRILOSEC) 40 MG capsule; Twice daily   Call the office with any questions or concerns. Recheck as scheduled and as needed.  See Gastroenterology. Increase Omeprazole.

## 2014-09-22 ENCOUNTER — Encounter: Payer: Self-pay | Admitting: Gastroenterology

## 2014-11-08 ENCOUNTER — Telehealth: Payer: Self-pay

## 2014-11-08 MED ORDER — TRAZODONE HCL 50 MG PO TABS: ORAL_TABLET | ORAL | Status: DC

## 2014-11-08 NOTE — Telephone Encounter (Signed)
Done

## 2014-11-08 NOTE — Telephone Encounter (Signed)
Rx request for Trazadone 50 mg- Take 0.5-1 tablet by mouth at bedtime as needed. #30.  Pls advise.

## 2014-11-18 ENCOUNTER — Ambulatory Visit: Payer: Commercial Managed Care - HMO | Admitting: Gastroenterology

## 2014-11-26 ENCOUNTER — Ambulatory Visit (INDEPENDENT_AMBULATORY_CARE_PROVIDER_SITE_OTHER): Payer: Commercial Managed Care - HMO | Admitting: Family

## 2014-11-26 ENCOUNTER — Encounter: Payer: Self-pay | Admitting: Family

## 2014-11-26 VITALS — BP 116/72 | HR 88 | Ht 64.0 in | Wt 131.9 lb

## 2014-11-26 DIAGNOSIS — I10 Essential (primary) hypertension: Secondary | ICD-10-CM

## 2014-11-26 DIAGNOSIS — K219 Gastro-esophageal reflux disease without esophagitis: Secondary | ICD-10-CM

## 2014-11-26 DIAGNOSIS — F32A Depression, unspecified: Secondary | ICD-10-CM

## 2014-11-26 DIAGNOSIS — F329 Major depressive disorder, single episode, unspecified: Secondary | ICD-10-CM

## 2014-11-26 LAB — CBC WITH DIFFERENTIAL/PLATELET
BASOS PCT: 0.4 % (ref 0.0–3.0)
Basophils Absolute: 0 10*3/uL (ref 0.0–0.1)
EOS ABS: 0.3 10*3/uL (ref 0.0–0.7)
Eosinophils Relative: 3.6 % (ref 0.0–5.0)
HCT: 40.3 % (ref 36.0–46.0)
Hemoglobin: 12.9 g/dL (ref 12.0–15.0)
LYMPHS ABS: 1.7 10*3/uL (ref 0.7–4.0)
Lymphocytes Relative: 22.4 % (ref 12.0–46.0)
MCHC: 32 g/dL (ref 30.0–36.0)
MCV: 88.1 fl (ref 78.0–100.0)
MONO ABS: 0.6 10*3/uL (ref 0.1–1.0)
Monocytes Relative: 8 % (ref 3.0–12.0)
Neutro Abs: 4.8 10*3/uL (ref 1.4–7.7)
Neutrophils Relative %: 65.6 % (ref 43.0–77.0)
PLATELETS: 116 10*3/uL — AB (ref 150.0–400.0)
RBC: 4.57 Mil/uL (ref 3.87–5.11)
RDW: 14.3 % (ref 11.5–15.5)
WBC: 7.4 10*3/uL (ref 4.0–10.5)

## 2014-11-26 LAB — HEPATIC FUNCTION PANEL
ALK PHOS: 126 U/L — AB (ref 39–117)
ALT: 18 U/L (ref 0–35)
AST: 24 U/L (ref 0–37)
Albumin: 4 g/dL (ref 3.5–5.2)
BILIRUBIN DIRECT: 0 mg/dL (ref 0.0–0.3)
BILIRUBIN TOTAL: 0.5 mg/dL (ref 0.2–1.2)
Total Protein: 7 g/dL (ref 6.0–8.3)

## 2014-11-26 LAB — BASIC METABOLIC PANEL
BUN: 29 mg/dL — ABNORMAL HIGH (ref 6–23)
CHLORIDE: 102 meq/L (ref 96–112)
CO2: 30 mEq/L (ref 19–32)
Calcium: 9.4 mg/dL (ref 8.4–10.5)
Creatinine, Ser: 0.9 mg/dL (ref 0.4–1.2)
GFR: 64.36 mL/min (ref >60.00)
Glucose, Bld: 97 mg/dL (ref 70–99)
POTASSIUM: 4 meq/L (ref 3.5–5.1)
SODIUM: 140 meq/L (ref 135–145)

## 2014-11-26 NOTE — Progress Notes (Signed)
Pre visit review using our clinic review tool, if applicable. No additional management support is needed unless otherwise documented below in the visit note. 

## 2014-11-26 NOTE — Patient Instructions (Signed)

## 2014-11-26 NOTE — Progress Notes (Signed)
Subjective:    Patient ID: Tiffany [name], female    DOB: 12/08/1929, 80 y.o.   MRN: [id number]  HPI 80 year old white female, nonsmoker with a history of anxiety, depression, hyperlipidemia, osteoarthritis is in today for recheck. Reports doing well. Denies any issues with her medication. Does not routinely exercise or following any particular diet.   Review of Systems  Constitutional: Negative.   HENT: Negative.   Respiratory: Negative.   Cardiovascular: Negative.   Gastrointestinal: Negative.   Endocrine: Negative.   Genitourinary: Negative.   Musculoskeletal: Negative.   Skin: Negative.   Allergic/Immunologic: Negative.   Neurological: Negative.   Hematological: Negative.   Psychiatric/Behavioral: Negative.    Past Medical History  Diagnosis Date  . Hypertension   . Arthritis     History   Social History  . Marital Status: Married    Spouse Name: N/A    Number of Children: 1  . Years of Education: N/A   Occupational History  . Retired    Social History Main Topics  . Smoking status: Never Smoker   . Smokeless tobacco: Never Used  . Alcohol Use: Yes     Comment: glass of wine per day  . Drug Use: No  . Sexual Activity: Not on file   Other Topics Concern  . Not on file   Social History Narrative    Past Surgical History  Procedure Laterality Date  . Back surgery  09/25/2011  . Abdominal hysterectomy      History reviewed. No pertinent family history.  No Known Allergies  Current Outpatient Prescriptions on File Prior to Visit  Medication Sig Dispense Refill  . aspirin 81 MG tablet Take 81 mg by mouth daily.    Marland Kitchen CRANBERRY PO Take 3,600 mg by mouth daily.    . DULoxetine (CYMBALTA) 60 MG capsule Take 1 capsule (60 mg total) by mouth daily. 30 capsule 3  . hydrochlorothiazide (HYDRODIURIL) 12.5 MG tablet TAKE 1 TABLET EVERY DAY 90 tablet PRN  . ipratropium (ATROVENT) 0.03 % nasal spray     . lisinopril (PRINIVIL,ZESTRIL) 20 MG tablet TAKE 1  TABLET EVERY DAY 90 tablet PRN  . loratadine (CLARITIN) 10 MG tablet Take 1 tablet (10 mg total) by mouth daily. 90 tablet 1  . Multiple Vitamin (MULTIVITAMIN) tablet Take 1 tablet by mouth daily.    Marland Kitchen omeprazole (PRILOSEC) 40 MG capsule Twice daily 180 capsule 1  . traZODone (DESYREL) 50 MG tablet TAKE 0.5-1 TABLETS (25-50 MG TOTAL) BY MOUTH AT BEDTIME AS NEEDED FOR SLEEP. 30 tablet 3   No current facility-administered medications on file prior to visit.    BP 116/72 mmHg  Pulse 88  Ht 5\' 4"  (1.626 m)  Wt 131 lb 14.4 oz (59.829 kg)  BMI 22.63 kg/m2chart    Objective:   Physical Exam  Constitutional: She is oriented to person, place, and time. She appears well-developed and well-nourished.  HENT:  Right Ear: External ear normal.  Left Ear: External ear normal.  Nose: Nose normal.  Mouth/Throat: Oropharynx is clear and moist.  Neck: Normal range of motion. Neck supple.  Cardiovascular: Normal rate, regular rhythm and normal heart sounds.   Pulmonary/Chest: Effort normal.  Abdominal: Soft. Bowel sounds are normal.  Musculoskeletal: Normal range of motion.  Neurological: She is alert and oriented to person, place, and time.  Skin: Skin is warm and dry.  Psychiatric: She has a normal mood and affect.          Assessment &  Plan:  Ahlivia was seen today for follow-up.  Diagnoses and associated orders for this visit:  Essential hypertension - Basic Metabolic Panel - Hepatic Function Panel - CBC with Differential  Gastroesophageal reflux disease without esophagitis - Basic Metabolic Panel - Hepatic Function Panel - CBC with Differential  Depression - Basic Metabolic Panel - Hepatic Function Panel - CBC with Differential    Encouraged healthy diet, exercise. Continue current medications. Follow-up in 6 months and sooner as needed.

## 2014-11-30 ENCOUNTER — Other Ambulatory Visit: Payer: Self-pay | Admitting: Family

## 2014-12-06 ENCOUNTER — Telehealth: Payer: Self-pay

## 2014-12-06 MED ORDER — OMEPRAZOLE 40 MG PO CPDR: DELAYED_RELEASE_CAPSULE | ORAL | Status: DC

## 2014-12-06 NOTE — Telephone Encounter (Signed)
Refill request for Omeprazole 40mg  #180 sent to [place]

## 2014-12-06 NOTE — Telephone Encounter (Signed)
Done

## 2014-12-07 ENCOUNTER — Other Ambulatory Visit: Payer: Self-pay

## 2014-12-07 MED ORDER — OMEPRAZOLE 40 MG PO CPDR: 40.0000 mg | DELAYED_RELEASE_CAPSULE | Freq: Every day | ORAL | Status: DC

## 2015-03-01 ENCOUNTER — Inpatient Hospital Stay (HOSPITAL_COMMUNITY)
Admission: EM | Admit: 2015-03-01 | Discharge: 2015-03-12 | DRG: 326 | Disposition: A | Discharge status: Home Health | Payer: Commercial Managed Care - HMO | Attending: Internal Medicine | Admitting: Internal Medicine

## 2015-03-01 ENCOUNTER — Encounter (HOSPITAL_COMMUNITY)
Admission: EM | Disposition: A | Discharge status: Home Health | Payer: Commercial Managed Care - HMO | Source: Home / Self Care | Attending: Pulmonary Disease

## 2015-03-01 ENCOUNTER — Emergency Department (HOSPITAL_COMMUNITY): Payer: Commercial Managed Care - HMO | Admitting: Anesthesiology

## 2015-03-01 ENCOUNTER — Inpatient Hospital Stay (HOSPITAL_COMMUNITY): Payer: Commercial Managed Care - HMO

## 2015-03-01 ENCOUNTER — Encounter (HOSPITAL_COMMUNITY): Payer: Self-pay | Admitting: Emergency Medicine

## 2015-03-01 DIAGNOSIS — B9620 Unspecified Escherichia coli [E. coli] as the cause of diseases classified elsewhere: Secondary | ICD-10-CM | POA: Diagnosis present

## 2015-03-01 DIAGNOSIS — T82594A Other mechanical complication of infusion catheter, initial encounter: Secondary | ICD-10-CM

## 2015-03-01 DIAGNOSIS — I480 Paroxysmal atrial fibrillation: Secondary | ICD-10-CM | POA: Diagnosis not present

## 2015-03-01 DIAGNOSIS — I5041 Acute combined systolic (congestive) and diastolic (congestive) heart failure: Secondary | ICD-10-CM | POA: Diagnosis present

## 2015-03-01 DIAGNOSIS — R1310 Dysphagia, unspecified: Secondary | ICD-10-CM | POA: Diagnosis present

## 2015-03-01 DIAGNOSIS — M1990 Unspecified osteoarthritis, unspecified site: Secondary | ICD-10-CM | POA: Diagnosis present

## 2015-03-01 DIAGNOSIS — Z6823 Body mass index (BMI) 23.0-23.9, adult: Secondary | ICD-10-CM

## 2015-03-01 DIAGNOSIS — K922 Gastrointestinal hemorrhage, unspecified: Secondary | ICD-10-CM | POA: Diagnosis not present

## 2015-03-01 DIAGNOSIS — I4891 Unspecified atrial fibrillation: Secondary | ICD-10-CM | POA: Diagnosis not present

## 2015-03-01 DIAGNOSIS — J849 Interstitial pulmonary disease, unspecified: Secondary | ICD-10-CM | POA: Insufficient documentation

## 2015-03-01 DIAGNOSIS — Z79899 Other long term (current) drug therapy: Secondary | ICD-10-CM | POA: Diagnosis not present

## 2015-03-01 DIAGNOSIS — R0602 Shortness of breath: Secondary | ICD-10-CM

## 2015-03-01 DIAGNOSIS — F0390 Unspecified dementia without behavioral disturbance: Secondary | ICD-10-CM | POA: Diagnosis present

## 2015-03-01 DIAGNOSIS — Z7982 Long term (current) use of aspirin: Secondary | ICD-10-CM

## 2015-03-01 DIAGNOSIS — E785 Hyperlipidemia, unspecified: Secondary | ICD-10-CM | POA: Diagnosis present

## 2015-03-01 DIAGNOSIS — I7300 Raynaud's syndrome without gangrene: Secondary | ICD-10-CM | POA: Diagnosis present

## 2015-03-01 DIAGNOSIS — J90 Pleural effusion, not elsewhere classified: Secondary | ICD-10-CM

## 2015-03-01 DIAGNOSIS — J9690 Respiratory failure, unspecified, unspecified whether with hypoxia or hypercapnia: Secondary | ICD-10-CM

## 2015-03-01 DIAGNOSIS — D62 Acute posthemorrhagic anemia: Secondary | ICD-10-CM | POA: Diagnosis present

## 2015-03-01 DIAGNOSIS — I509 Heart failure, unspecified: Secondary | ICD-10-CM

## 2015-03-01 DIAGNOSIS — D6959 Other secondary thrombocytopenia: Secondary | ICD-10-CM | POA: Diagnosis present

## 2015-03-01 DIAGNOSIS — K3182 Dieulafoy lesion (hemorrhagic) of stomach and duodenum: Principal | ICD-10-CM | POA: Diagnosis present

## 2015-03-01 DIAGNOSIS — I10 Essential (primary) hypertension: Secondary | ICD-10-CM | POA: Diagnosis present

## 2015-03-01 DIAGNOSIS — J9600 Acute respiratory failure, unspecified whether with hypoxia or hypercapnia: Secondary | ICD-10-CM | POA: Diagnosis present

## 2015-03-01 DIAGNOSIS — J9601 Acute respiratory failure with hypoxia: Secondary | ICD-10-CM | POA: Diagnosis not present

## 2015-03-01 DIAGNOSIS — R0600 Dyspnea, unspecified: Secondary | ICD-10-CM | POA: Diagnosis not present

## 2015-03-01 DIAGNOSIS — Z452 Encounter for adjustment and management of vascular access device: Secondary | ICD-10-CM | POA: Diagnosis not present

## 2015-03-01 DIAGNOSIS — N390 Urinary tract infection, site not specified: Secondary | ICD-10-CM | POA: Diagnosis present

## 2015-03-01 DIAGNOSIS — Z789 Other specified health status: Secondary | ICD-10-CM

## 2015-03-01 DIAGNOSIS — Z9289 Personal history of other medical treatment: Secondary | ICD-10-CM

## 2015-03-01 DIAGNOSIS — I481 Persistent atrial fibrillation: Secondary | ICD-10-CM | POA: Diagnosis present

## 2015-03-01 DIAGNOSIS — E876 Hypokalemia: Secondary | ICD-10-CM | POA: Diagnosis not present

## 2015-03-01 DIAGNOSIS — E440 Moderate protein-calorie malnutrition: Secondary | ICD-10-CM | POA: Diagnosis present

## 2015-03-01 DIAGNOSIS — J811 Chronic pulmonary edema: Secondary | ICD-10-CM

## 2015-03-01 DIAGNOSIS — K920 Hematemesis: Secondary | ICD-10-CM | POA: Diagnosis present

## 2015-03-01 DIAGNOSIS — R400 Somnolence: Secondary | ICD-10-CM | POA: Diagnosis not present

## 2015-03-01 HISTORY — DX: Unspecified dementia, unspecified severity, without behavioral disturbance, psychotic disturbance, mood disturbance, and anxiety: F03.90

## 2015-03-01 HISTORY — PX: ESOPHAGOGASTRODUODENOSCOPY: SHX5428

## 2015-03-01 LAB — COMPREHENSIVE METABOLIC PANEL
ALBUMIN: 3.5 g/dL (ref 3.5–5.2)
ALBUMIN: 3 g/dL — AB (ref 3.5–5.2)
ALT: 19 U/L (ref 0–35)
ALT: 16 U/L (ref 0–35)
ANION GAP: 9 (ref 5–15)
AST: 20 U/L (ref 0–37)
AST: 21 U/L (ref 0–37)
Alkaline Phosphatase: 92 U/L (ref 39–117)
Alkaline Phosphatase: 72 U/L (ref 39–117)
Anion gap: 7 (ref 5–15)
BILIRUBIN TOTAL: 0.6 mg/dL (ref 0.3–1.2)
BUN: 63 mg/dL — ABNORMAL HIGH (ref 6–23)
BUN: 55 mg/dL — AB (ref 6–23)
CHLORIDE: 109 mmol/L (ref 96–112)
CO2: 27 mmol/L (ref 19–32)
CO2: 26 mmol/L (ref 19–32)
CREATININE: 0.68 mg/dL (ref 0.50–1.10)
Calcium: 8.7 mg/dL (ref 8.4–10.5)
Calcium: 8.1 mg/dL — ABNORMAL LOW (ref 8.4–10.5)
Chloride: 110 mmol/L (ref 96–112)
Creatinine, Ser: 0.8 mg/dL (ref 0.50–1.10)
GFR calc non Af Amer: 79 mL/min — ABNORMAL LOW (ref >90)
GFR, EST AFRICAN AMERICAN: 77 mL/min — AB (ref >90)
GFR, EST NON AFRICAN AMERICAN: 66 mL/min — AB (ref >90)
Glucose, Bld: 186 mg/dL — ABNORMAL HIGH (ref 70–99)
Glucose, Bld: 151 mg/dL — ABNORMAL HIGH (ref 70–99)
POTASSIUM: 3.2 mmol/L — AB (ref 3.5–5.1)
Potassium: 4.8 mmol/L (ref 3.5–5.1)
Sodium: 145 mmol/L (ref 135–145)
Sodium: 143 mmol/L (ref 135–145)
TOTAL PROTEIN: 5.4 g/dL — AB (ref 6.0–8.3)
Total Bilirubin: 0.6 mg/dL (ref 0.3–1.2)
Total Protein: 5 g/dL — ABNORMAL LOW (ref 6.0–8.3)

## 2015-03-01 LAB — PREPARE RBC (CROSSMATCH)

## 2015-03-01 LAB — BLOOD GAS, ARTERIAL
ACID-BASE DEFICIT: 0.5 mmol/L (ref 0.0–2.0)
BICARBONATE: 24.7 meq/L — AB (ref 20.0–24.0)
Drawn by: 331471
FIO2: 1 %
O2 SAT: 100 %
PEEP: 5 cmH2O
Patient temperature: 98.6
RATE: 16 resp/min
TCO2: 24 mmol/L (ref 0–100)
VT: 420 mL
pCO2 arterial: 47.4 mmHg — ABNORMAL HIGH (ref 35.0–45.0)
pH, Arterial: 7.338 — ABNORMAL LOW (ref 7.350–7.450)
pO2, Arterial: 432 mmHg — ABNORMAL HIGH (ref 80.0–100.0)

## 2015-03-01 LAB — PROTIME-INR
INR: 1.26 (ref 0.00–1.49)
INR: 1.25 (ref 0.00–1.49)
PROTHROMBIN TIME: 15.9 s — AB (ref 11.6–15.2)
PROTHROMBIN TIME: 15.8 s — AB (ref 11.6–15.2)

## 2015-03-01 LAB — HEMOGLOBIN AND HEMATOCRIT, BLOOD
HEMATOCRIT: 27.9 % — AB (ref 36.0–46.0)
Hemoglobin: 8.6 g/dL — ABNORMAL LOW (ref 12.0–15.0)

## 2015-03-01 LAB — MRSA PCR SCREENING: MRSA by PCR: NEGATIVE

## 2015-03-01 LAB — BRAIN NATRIURETIC PEPTIDE: B NATRIURETIC PEPTIDE 5: 55.6 pg/mL (ref 0.0–100.0)

## 2015-03-01 LAB — CBC
HCT: 28.2 % — ABNORMAL LOW (ref 36.0–46.0)
Hemoglobin: 8.8 g/dL — ABNORMAL LOW (ref 12.0–15.0)
MCH: 28.7 pg (ref 26.0–34.0)
MCHC: 31.2 g/dL (ref 30.0–36.0)
MCV: 91.9 fL (ref 78.0–100.0)
PLATELETS: 160 10*3/uL (ref 150–400)
RBC: 3.07 MIL/uL — ABNORMAL LOW (ref 3.87–5.11)
RDW: 14.3 % (ref 11.5–15.5)
WBC: 13.1 10*3/uL — AB (ref 4.0–10.5)

## 2015-03-01 LAB — APTT: APTT: 30 s (ref 24–37)

## 2015-03-01 LAB — ABO/RH: ABO/RH(D): B NEG

## 2015-03-01 SURGERY — EGD (ESOPHAGOGASTRODUODENOSCOPY)
Anesthesia: General

## 2015-03-01 MED ORDER — IOHEXOL 350 MG/ML SOLN
100.0000 mL | Freq: Once | INTRAVENOUS | Status: AC | PRN
  Administered 2015-03-01: 100 mL via INTRAVENOUS

## 2015-03-01 MED ORDER — ROCURONIUM BROMIDE 100 MG/10ML IV SOLN
INTRAVENOUS | Status: AC
  Filled 2015-03-01: qty 2

## 2015-03-01 MED ORDER — FENTANYL CITRATE 0.05 MG/ML IJ SOLN: 50.0000 ug | Freq: Once | INTRAMUSCULAR | Status: AC

## 2015-03-01 MED ORDER — ONDANSETRON HCL 4 MG/2ML IJ SOLN
INTRAMUSCULAR | Status: DC | PRN
  Administered 2015-03-01: 4 mg via INTRAVENOUS

## 2015-03-01 MED ORDER — POTASSIUM CHLORIDE 10 MEQ/100ML IV SOLN
10.0000 meq | INTRAVENOUS | Status: AC
  Administered 2015-03-01 (×4): 10 meq via INTRAVENOUS
  Filled 2015-03-01 (×8): qty 100

## 2015-03-01 MED ORDER — ROCURONIUM BROMIDE 100 MG/10ML IV SOLN
INTRAVENOUS | Status: DC | PRN
  Administered 2015-03-01 (×3): 10 mg via INTRAVENOUS
  Administered 2015-03-01: 25 mg via INTRAVENOUS

## 2015-03-01 MED ORDER — SODIUM CHLORIDE 0.9 % IV SOLN
80.0000 mg | Freq: Once | INTRAVENOUS | Status: AC
  Administered 2015-03-01: 80 mg via INTRAVENOUS
  Filled 2015-03-01: qty 80

## 2015-03-01 MED ORDER — SODIUM CHLORIDE 0.9 % IV BOLUS (SEPSIS)
1000.0000 mL | Freq: Once | INTRAVENOUS | Status: AC
Start: 2015-03-01 — End: 2015-03-01
  Administered 2015-03-01: 1000 mL via INTRAVENOUS

## 2015-03-01 MED ORDER — SODIUM CHLORIDE 0.9 % IV SOLN: Freq: Once | INTRAVENOUS | Status: AC

## 2015-03-01 MED ORDER — PROPOFOL 10 MG/ML IV BOLUS
INTRAVENOUS | Status: DC | PRN
  Administered 2015-03-01: 100 mg via INTRAVENOUS
  Administered 2015-03-01: 10 mg via INTRAVENOUS
  Administered 2015-03-01: 20 mg via INTRAVENOUS
  Administered 2015-03-01 (×2): 10 mg via INTRAVENOUS

## 2015-03-01 MED ORDER — ONDANSETRON HCL 4 MG/2ML IJ SOLN
4.0000 mg | Freq: Once | INTRAMUSCULAR | Status: AC
  Administered 2015-03-01: 4 mg via INTRAVENOUS
  Filled 2015-03-01: qty 2

## 2015-03-01 MED ORDER — PROPOFOL 10 MG/ML IV BOLUS
INTRAVENOUS | Status: AC
  Filled 2015-03-01: qty 20

## 2015-03-01 MED ORDER — LACTATED RINGERS IV SOLN
INTRAVENOUS | Status: DC | PRN
  Administered 2015-03-01: 13:00:00 via INTRAVENOUS

## 2015-03-01 MED ORDER — SODIUM CHLORIDE 0.9 % IV SOLN
INTRAVENOUS | Status: DC
  Administered 2015-03-01 – 2015-03-03 (×5): via INTRAVENOUS

## 2015-03-01 MED ORDER — PANTOPRAZOLE SODIUM 40 MG IV SOLR
40.0000 mg | Freq: Two times a day (BID) | INTRAVENOUS | Status: DC
  Administered 2015-03-01 – 2015-03-05 (×9): 40 mg via INTRAVENOUS
  Filled 2015-03-01 (×13): qty 40

## 2015-03-01 MED ORDER — SUCCINYLCHOLINE CHLORIDE 20 MG/ML IJ SOLN
INTRAMUSCULAR | Status: DC | PRN
  Administered 2015-03-01: 100 mg via INTRAVENOUS

## 2015-03-01 MED ORDER — LIDOCAINE HCL (CARDIAC) 20 MG/ML IV SOLN
INTRAVENOUS | Status: AC
  Filled 2015-03-01: qty 5

## 2015-03-01 MED ORDER — CHLORHEXIDINE GLUCONATE 0.12 % MT SOLN
15.0000 mL | Freq: Two times a day (BID) | OROMUCOSAL | Status: DC
  Administered 2015-03-01 – 2015-03-03 (×5): 15 mL via OROMUCOSAL
  Filled 2015-03-01 (×6): qty 15

## 2015-03-01 MED ORDER — ONDANSETRON HCL 4 MG/2ML IJ SOLN
INTRAMUSCULAR | Status: AC
  Filled 2015-03-01: qty 2

## 2015-03-01 MED ORDER — SODIUM CHLORIDE 0.9 % IV BOLUS (SEPSIS)
1000.0000 mL | Freq: Once | INTRAVENOUS | Status: AC
  Administered 2015-03-01: 500 mL via INTRAVENOUS

## 2015-03-01 MED ORDER — FENTANYL CITRATE 0.05 MG/ML IJ SOLN
INTRAMUSCULAR | Status: DC | PRN
  Administered 2015-03-01 (×2): 50 ug via INTRAVENOUS

## 2015-03-01 MED ORDER — FENTANYL CITRATE 0.05 MG/ML IJ SOLN
INTRAMUSCULAR | Status: AC
  Filled 2015-03-01: qty 2

## 2015-03-01 MED ORDER — CETYLPYRIDINIUM CHLORIDE 0.05 % MT LIQD
7.0000 mL | Freq: Four times a day (QID) | OROMUCOSAL | Status: DC
Start: 2015-03-02 — End: 2015-03-04
  Administered 2015-03-01 – 2015-03-04 (×10): 7 mL via OROMUCOSAL

## 2015-03-01 MED ORDER — LIDOCAINE HCL (CARDIAC) 20 MG/ML IV SOLN
INTRAVENOUS | Status: DC | PRN
  Administered 2015-03-01: 50 mg via INTRAVENOUS

## 2015-03-01 MED ORDER — PROPOFOL 10 MG/ML IV EMUL
5.0000 ug/kg/min | INTRAVENOUS | Status: DC
  Administered 2015-03-01: 35 ug/kg/min via INTRAVENOUS
  Administered 2015-03-01: 10 ug/kg/min via INTRAVENOUS
  Administered 2015-03-02 (×3): 35 ug/kg/min via INTRAVENOUS
  Administered 2015-03-03: 30 ug/kg/min via INTRAVENOUS
  Filled 2015-03-01 (×7): qty 100

## 2015-03-01 MED ORDER — FENTANYL CITRATE 0.05 MG/ML IJ SOLN
100.0000 ug | INTRAMUSCULAR | Status: DC | PRN
  Administered 2015-03-01: 50 ug via INTRAVENOUS
  Administered 2015-03-01 – 2015-03-03 (×5): 100 ug via INTRAVENOUS
  Filled 2015-03-01 (×6): qty 2

## 2015-03-01 NOTE — ED Notes (Signed)
Bed: WA06 Expected date:  Expected time:  Means of arrival:  Comments: EMS- elderly, GI bleed

## 2015-03-01 NOTE — Anesthesia Preprocedure Evaluation (Addendum)
Anesthesia Evaluation  Patient identified by MRN, date of birth, ID band Patient awake    Reviewed: Allergy & Precautions, NPO status , Patient's Chart, lab work & pertinent test results  History of Anesthesia Complications Negative for: history of anesthetic complications  Airway Mallampati: II  TM Distance: >3 FB Neck ROM: Full    Dental no notable dental hx. (+) Dental Advisory Given   Pulmonary neg pulmonary ROS,  breath sounds clear to auscultation  Pulmonary exam normal       Cardiovascular hypertension, Pt. on medications + Peripheral Vascular Disease Rhythm:Regular Rate:Normal     Neuro/Psych PSYCHIATRIC DISORDERS Anxiety Depression negative neurological ROS     GI/Hepatic negative GI ROS, Neg liver ROS,   Endo/Other  negative endocrine ROS  Renal/GU negative Renal ROS  negative genitourinary   Musculoskeletal  (+) Arthritis -,   Abdominal   Peds negative pediatric ROS (+)  Hematology negative hematology ROS (+)   Anesthesia Other Findings   Reproductive/Obstetrics negative OB ROS                           Anesthesia Physical Anesthesia Plan  ASA: III and emergent  Anesthesia Plan: General   Post-op Pain Management:    Induction: Intravenous, Rapid sequence and Cricoid pressure planned  Airway Management Planned: Oral ETT  Additional Equipment:   Intra-op Plan:   Post-operative Plan: Extubation in OR  Informed Consent: I have reviewed the patients History and Physical, chart, labs and discussed the procedure including the risks, benefits and alternatives for the proposed anesthesia with the patient or authorized representative who has indicated his/her understanding and acceptance.   Dental advisory given  Plan Discussed with: CRNA  Anesthesia Plan Comments:         Anesthesia Quick Evaluation

## 2015-03-01 NOTE — H&P (Signed)
Patient interval history reviewed.  Patient examined again.  There has been no change from documented ED H/P dated  03/01/15 (scanned into chart from our office) except as documented above.  Assessment:  1.  Hematemesis.  Plan:  1.  Endoscopy. 2.  Risks (bleeding, infection, bowel perforation that could require surgery, sedation-related changes in cardiopulmonary systems), benefits (identification and possible treatment of source of symptoms, exclusion of certain causes of symptoms), and alternatives (watchful waiting, radiographic imaging studies, empiric medical treatment) of upper endoscopy (EGD) were explained to patient/family in detail and patient wishes to proceed.

## 2015-03-01 NOTE — Procedures (Signed)
Central Venous Catheter Insertion Procedure Note KIMA MALENFANT [id number] 1929-08-22  Procedure: Insertion of Central Venous Catheter Indications: Drug and/or fluid administration and Frequent blood sampling  Procedure Details Consent: Risks of procedure as well as the alternatives and risks of each were explained to the (patient/caregiver).  Consent for procedure obtained. Time Out: Verified patient identification, verified procedure, site/side was marked, verified correct patient position, special equipment/implants available, medications/allergies/relevent history reviewed, required imaging and test results available.  Performed  Maximum sterile technique was used including antiseptics, cap, gloves, gown, hand hygiene, mask and sheet. Skin prep: Chlorhexidine; local anesthetic administered A antimicrobial bonded/coated triple lumen catheter was placed in the right femoral vein due to multiple attempts, no other available access using the Seldinger technique.  Evaluation Blood flow good Complications: No apparent complications Patient did tolerate procedure well. Chest X-ray ordered to verify placement.  CXR: pending.  Kaliegh Willadsen 03/01/2015, 7:12 PM  Note : left ij cannulated successfuly using thin angiocath and confirmed positon in left IJ with good venous return. But at 10cm or so signifficant resistance met. So procedure turned to Rt femoral line - vein cannulated. Real time 2D ultrasound used for vein site selection, patency assessment, and needle entry/ A record of image was made but could not be submitted for filing due to malfunction of printing device  Dr. Brand Males, M.D., [facility].C.P Pulmonary and Critical Care Medicine Staff Physician [facility] Pager: [phone], If no answer or between  15:00h - 7:00h: call [phone]  03/01/2015 7:13 PM

## 2015-03-01 NOTE — ED Notes (Signed)
Pt transported to endoscopy 

## 2015-03-01 NOTE — Progress Notes (Signed)
Patient ID: Tiffany [name], female   DOB: 1929/08/09, 81 y.o.   MRN: [id number]  Dr. Paulita Fujita contacted IR about upper GI bleeding.  Large of amount of blood identified in distal esophagus and gastric fundus.  Source of bleeding was not identified with endoscopy.  Unclear if bleeding was coming from arterial source or varices. Discussed diagnostic options with Dr. Paulita Fujita.  We decided on CTA of abdomen and pelvis.  A high quality CTA was performed with the patient intubated.  Hyperemia noted in distal esophagus and stomach but no large varices and no obvious source of bleeding. The patient was hemodynamically stable throughout the CT examination and suspect the bleeding stopped.  Discussed with Dr. Paulita Fujita and [place].  Patient transferred to ICU.  IR is available if patient shows signs of bleeding again.

## 2015-03-01 NOTE — ED Provider Notes (Signed)
CSN: [id number]     Arrival date & time 03/01/15  0932 History   First MD Initiated Contact with Patient 03/01/15 [phone]     Chief Complaint  Patient presents with  . Hematemesis     (Consider location/radiation/quality/duration/timing/severity/associated sxs/prior Treatment) Patient is a 81 y.o. female presenting with vomiting. The history is provided by the patient.  Emesis Severity:  Mild Duration:  1 hour Timing:  Constant Quality:  Bright red blood Progression:  Unchanged Chronicity:  New Recent urination:  Normal Relieved by:  Nothing Worsened by:  Nothing tried Associated symptoms: abdominal pain   Associated symptoms: no chills, no diarrhea, no fever, no sore throat and no URI   Risk factors: no alcohol use, not pregnant now, no suspect food intake and no travel to endemic areas     Past Medical History  Diagnosis Date  . Hypertension   . Arthritis    Past Surgical History  Procedure Laterality Date  . Back surgery  09/25/2011  . Abdominal hysterectomy     History reviewed. No pertinent family history. History  Substance Use Topics  . Smoking status: Never Smoker   . Smokeless tobacco: Never Used  . Alcohol Use: Yes     Comment: glass of wine per day   OB History    No data available     Review of Systems  Constitutional: Negative for fever and chills.  HENT: Negative for sore throat.   Respiratory: Negative for cough and shortness of breath.   Gastrointestinal: Positive for nausea, vomiting and abdominal pain. Negative for diarrhea and blood in stool.  All other systems reviewed and are negative.     Allergies  Review of patient's allergies indicates no known allergies.  Home Medications   Prior to Admission medications   Medication Sig Start Date End Date Taking? Authorizing Provider  aspirin 81 MG tablet Take 81 mg by mouth daily.   Yes Historical Provider, MD  CRANBERRY PO Take 3,600 mg by mouth daily.   Yes Historical Provider, MD   diphenhydramine-acetaminophen (TYLENOL PM) 25-500 MG TABS Take 1 tablet by mouth at bedtime as needed.   Yes Historical Provider, MD  DULoxetine (CYMBALTA) 60 MG capsule TAKE 1 CAPSULE (60 MG TOTAL) BY MOUTH DAILY. Patient taking differently: TAKE 1 CAPSULE (60 MG TOTAL) BY MOUTH DAILY as needed for mood 11/30/14  Yes Timoteo Gaul, FNP  hydrochlorothiazide (HYDRODIURIL) 12.5 MG tablet TAKE 1 TABLET EVERY DAY 07/23/14  Yes Timoteo Gaul, FNP  lisinopril (PRINIVIL,ZESTRIL) 20 MG tablet TAKE 1 TABLET EVERY DAY 07/23/14  Yes Timoteo Gaul, FNP  loratadine (CLARITIN) 10 MG tablet Take 1 tablet (10 mg total) by mouth daily. 08/06/12  Yes Timoteo Gaul, FNP  Multiple Vitamin (MULTIVITAMIN) tablet Take 1 tablet by mouth daily.   Yes Historical Provider, MD  omeprazole (PRILOSEC) 40 MG capsule Take 1 capsule (40 mg total) by mouth daily. Twice daily Patient taking differently: Take 40 mg by mouth at bedtime as needed (heartburn/acid reflux). Twice daily 12/07/14  Yes Timoteo Gaul, FNP  traZODone (DESYREL) 50 MG tablet TAKE 0.5-1 TABLETS (25-50 MG TOTAL) BY MOUTH AT BEDTIME AS NEEDED FOR SLEEP. Patient not taking: Reported on 03/01/2015 11/08/14   Timoteo Gaul, FNP   BP 96/67 mmHg  Pulse 77  Temp(Src) 97.3 F (36.3 C) (Oral)  Resp 20 Physical Exam  Constitutional: She is oriented to person, place, and time. She appears well-developed and well-nourished. No distress.  HENT:  Head: Normocephalic and  atraumatic.  Mouth/Throat: Oropharynx is clear and moist.  Eyes: EOM are normal. Pupils are equal, round, and reactive to light.  Neck: Normal range of motion. Neck supple.  Cardiovascular: Normal rate and regular rhythm.  Exam reveals no friction rub.   No murmur heard. Pulmonary/Chest: Effort normal and breath sounds normal. No respiratory distress. She has no wheezes. She has no rales.  Abdominal: Soft. She exhibits no distension. There is no tenderness. There is no  rebound.  Musculoskeletal: Normal range of motion. She exhibits no edema.  Neurological: She is alert and oriented to person, place, and time. No cranial nerve deficit. She exhibits normal muscle tone. Coordination normal.  Skin: No rash noted. She is not diaphoretic.  Nursing note and vitals reviewed.   ED Course  Procedures (including critical care time) Labs Review Labs Reviewed  CBC  COMPREHENSIVE METABOLIC PANEL  PROTIME-INR  TYPE AND SCREEN    Imaging Review No results found.   EKG Interpretation None     CRITICAL CARE Performed by: Osvaldo Shipper   Total critical care time: 30 minutes  Critical care time was exclusive of separately billable procedures and treating other patients.  Critical care was necessary to treat or prevent imminent or life-threatening deterioration.  Critical care was time spent personally by me on the following activities: development of treatment plan with patient and/or surrogate as well as nursing, discussions with consultants, evaluation of patient's response to treatment, examination of patient, obtaining history from patient or surrogate, ordering and performing treatments and interventions, ordering and review of laboratory studies, ordering and review of radiographic studies, pulse oximetry and re-evaluation of patient's condition.  MDM   Final diagnoses:  Upper GI bleeding    10F here with frank hematemesis. Began to 3 minutes after eating this morning. It was roughly 300 mils of blood with EMS. Daughter stated covered the floor. There were some clots. She got Zofran and saline area with EMS. No liver problems. No history of ulcers. She ate breakfast like she always does. Increase and gastritis risk factors like alcohol, caffeine. Hears softer blood pressures. Mild epigastric pain. No active vomiting. She is on aspirin but no other blood thinners. We'll plan on NG tube, labs. Will consult GI once labs return.  NG placed, still  bleeding on lavage. Patient's Hgb has dropped to 8.8. Previous was 1 when checked 3 months ago. GI emergently consulted. Patient taken up to Emergent Endoscopy.  Evelina Bucy, MD 03/01/15 [phone]

## 2015-03-01 NOTE — Progress Notes (Signed)
Pt transferred to CT scan with CRNA Carleene Cooper, and Delena Bali CRNA. Pt stable through tranfer. Delena Bali CRNA to stay with pt in CT scan and determine next phase of care.

## 2015-03-01 NOTE — Consult Note (Signed)
[name]  Referring Provider:  [facility] Primary Care Physician:  Tiffany Ast, FNP   Reason for Consultation:  hematemesis  HPI: Tiffany [name] is a 82 y.o. female presenting with acute onset hematemesis this morning soon after breakfast.  Has had some intermittent black stools over the past year or two, but no prior hematemesis.  Has lost about 20 lbs gradually.  Prior dysphagia, which has resolved with antipeptic therapies.  No prior hematemesis.  Takes aspirin daily, but no blood thinners.  She is having no abdominal pain, and has had no recent blood in her stool.    Past Medical History  Diagnosis Date  . Hypertension   . Arthritis   . Dementia     Past Surgical History  Procedure Laterality Date  . Back surgery  09/25/2011  . Abdominal hysterectomy      Prior to Admission medications   Medication Sig Start Date End Date Taking? Authorizing Provider  aspirin 81 MG tablet Take 81 mg by mouth daily.   Yes Historical Provider, MD  CRANBERRY PO Take 3,600 mg by mouth daily.   Yes Historical Provider, MD  diphenhydramine-acetaminophen (TYLENOL PM) 25-500 MG TABS Take 1 tablet by mouth at bedtime as needed.   Yes Historical Provider, MD  DULoxetine (CYMBALTA) 60 MG capsule TAKE 1 CAPSULE (60 MG TOTAL) BY MOUTH DAILY. Patient taking differently: TAKE 1 CAPSULE (60 MG TOTAL) BY MOUTH DAILY as needed for mood 11/30/14  Yes Timoteo Gaul, FNP  hydrochlorothiazide (HYDRODIURIL) 12.5 MG tablet TAKE 1 TABLET EVERY DAY 07/23/14  Yes Timoteo Gaul, FNP  lisinopril (PRINIVIL,ZESTRIL) 20 MG tablet TAKE 1 TABLET EVERY DAY 07/23/14  Yes Timoteo Gaul, FNP  loratadine (CLARITIN) 10 MG tablet Take 1 tablet (10 mg total) by mouth daily. 08/06/12  Yes Timoteo Gaul, FNP  Multiple Vitamin (MULTIVITAMIN) tablet Take 1 tablet by mouth daily.   Yes Historical Provider, MD  omeprazole (PRILOSEC) 40 MG capsule  Take 1 capsule (40 mg total) by mouth daily. Twice daily Patient taking differently: Take 40 mg by mouth at bedtime as needed (heartburn/acid reflux). Twice daily 12/07/14  Yes Timoteo Gaul, FNP  traZODone (DESYREL) 50 MG tablet TAKE 0.5-1 TABLETS (25-50 MG TOTAL) BY MOUTH AT BEDTIME AS NEEDED FOR SLEEP. Patient not taking: Reported on 03/01/2015 11/08/14   Timoteo Gaul, FNP    No current facility-administered medications for this encounter.    Allergies as of 03/01/2015  . (No Known Allergies)    History reviewed. No pertinent family history.  History   Social History  . Marital Status: Married    Spouse Name: N/A  . Number of Children: 1  . Years of Education: N/A   Occupational History  . Retired    Social History Main Topics  . Smoking status: Never Smoker   . Smokeless tobacco: Never Used  . Alcohol Use: Yes     Comment: glass of wine per day  . Drug Use: No  . Sexual Activity: Not on file   Other Topics Concern  . Not on file   Social History Narrative    Review of Systems: As per HPI, all others negative  Physical Exam: Vital signs in last 24 hours: Temp:  [97.3 F (36.3 C)-97.6 F (36.4 C)] 97.6 F (36.4 C) (03/22 1203) Pulse Rate:  [77-90] 90 (03/22 1203) Resp:  [20-22] 22 (03/22 1203) BP: (96-117)/(52-67) 117/52 mmHg (03/22 1203) SpO2:  [92 %-93 %]  92 % (03/22 1203) Weight:  [56.7 kg (125 lb)] 56.7 kg (125 lb) (03/22 1203)   General:   Alert,  Elderly but is in no acute distressD Head:  Normocephalic and atraumatic. Eyes:  Sclera clear, no icterus.   Conjunctiva slightly pale Ears:  Normal auditory acuity. Nose:  No deformity, discharge,  or lesions; ngt in right nare, ~ 100 red blood in canister Mouth:  No deformity or lesions.  Oropharynx pale and dry Neck:  Supple; no masses or thyromegaly. Lungs:  Clear throughout to auscultation.   No wheezes, crackles, or rhonchi. No acute distress. Heart:  Regular rate and rhythm; no murmurs,  clicks, rubs,  or gallops. Abdomen:  Soft, nontender and nondistended. No masses, hepatosplenomegaly or hernias noted. Normal bowel sounds, without guarding, and without rebound.     Msk:  Symmetrical without gross deformities. Normal posture. Pulses:  Normal pulses noted. Extremities:  Without clubbing or edema. Neurologic:  Alert and  oriented x4;  Diffusely weak, otherwise grossly normal neurologically. Skin:  Intact without significant lesions or rashes. Cervical Nodes:  No significant cervical adenopathy. Psych:  Alert and cooperative. Normal mood and affect.   Lab Results:  Recent Labs  03/01/15 0958  WBC 13.1*  HGB 8.8*  HCT 28.2*  PLT 160   BMET  Recent Labs  03/01/15 0958  NA 145  K 3.2*  CL 109  CO2 27  GLUCOSE 186*  BUN 63*  CREATININE 0.80  CALCIUM 8.7   LFT  Recent Labs  03/01/15 0958  PROT 5.4*  ALBUMIN 3.5  [name] 20  ALT 19  ALKPHOS 92  BILITOT 0.6   PT/INR No results for input(s): LABPROT, INR in the last 72 hours.  Studies/Results: No results found.  Impression:  1.  Frank hematemesis.  Unclear etiology.  Hemodynamics improved after volume repletion. 2.  Acute blood loss anemia.  Plan:  1.  Endoscopy. 2.  Risks (bleeding, infection, bowel perforation that could require surgery, sedation-related changes in cardiopulmonary systems), benefits (identification and possible treatment of source of symptoms, exclusion of certain causes of symptoms), and alternatives (watchful waiting, radiographic imaging studies, empiric medical treatment) of upper endoscopy (EGD) were explained to patient/family in detail and patient wishes to proceed. 3.  Will discuss with Hospitalists re: admission post-procedurally.     Landry Dyke  03/01/2015, 12:32 PM

## 2015-03-01 NOTE — ED Notes (Addendum)
Pt woke up this morning vomiting blood. Per EMS approx 381ml of blood when arrived. Pt feels weak, denies pain. Pt received 72ml NS PTA by EMS. Pt received 4mg  zofran PTA.

## 2015-03-01 NOTE — Transfer of Care (Signed)
Immediate Anesthesia Transfer of Care Note  Patient: Tiffany [name]  Procedure(s) Performed: Procedure(s): ESOPHAGOGASTRODUODENOSCOPY (EGD) (N/A)  Patient Location: ICU  Anesthesia Type:General  Level of Consciousness: sedated  Airway & Oxygen Therapy: Patient remains intubated per anesthesia plan and Patient placed on Ventilator (see vital sign flow sheet for setting)  Post-op Assessment: Report given to RN and Post -op Vital signs reviewed and stable  Post vital signs: Reviewed and stable  Last Vitals:  Filed Vitals:   03/01/15 1350  BP: 100/50  Pulse: 70  Temp:   Resp:     Complications: No apparent anesthesia complications

## 2015-03-01 NOTE — Progress Notes (Signed)
MD Titus Mould updated on latest Hgb of 8.6, will hold off on 1 unit transfusion for now

## 2015-03-01 NOTE — Anesthesia Postprocedure Evaluation (Signed)
  Anesthesia Post-op Note  Patient: Tiffany [name]  Procedure(s) Performed: Procedure(s) (LRB): ESOPHAGOGASTRODUODENOSCOPY (EGD) (N/A)  Patient Location: PACU  Anesthesia Type: General  Level of Consciousness: intubated and sedated  Airway and Oxygen Therapy: Patient Spontanous Breathing  Post-op Pain: intubated and sedated  Post-op Assessment: Post-op Vital signs reviewed, Patient's Cardiovascular Status Stable, Respiratory Function Stable, Patent Airway and No signs of Nausea or vomiting  Last Vitals:  Filed Vitals:   03/01/15 1350  BP: 100/50  Pulse: 70  Temp:   Resp:     Post-op Vital Signs: stable   Complications: No apparent anesthesia complications. Decision to leave patient intubated given GI bleed, possible need for blood products, and no firm decision yet as to plan for surgical management

## 2015-03-01 NOTE — Consult Note (Addendum)
PULMONARY / CRITICAL CARE MEDICINE   Name: Tiffany [name] MRN: [id number] DOB: 1929/08/28    ADMISSION DATE:  03/01/2015 CONSULTATION DATE:3/22  REFERRING MD : outlaw  CHIEF COMPLAINT:  Hematemesis   INITIAL PRESENTATION: Hematemesis   STUDIES:    SIGNIFICANT EVENTS: 3/22 intubated for egd   HISTORY OF PRESENT ILLNESS:  81 yo WM who presented to [facility] with new onset hematemesis and was taken to Endo suite for scope without source identification. She was intubated for the procedure and remains intubated. She is going to IR intubated and PCCM asked to manage vent and her car while intubated. She is not on anticoagulants as an outpatient. Currently HD stable and in route to IR.   PAST MEDICAL HISTORY :   has a past medical history of Hypertension; Arthritis; and Dementia.  has past surgical history that includes Back surgery (09/25/2011) and Abdominal hysterectomy. Prior to Admission medications   Medication Sig Start Date End Date Taking? Authorizing Provider  aspirin 81 MG tablet Take 81 mg by mouth daily.   Yes Historical Provider, MD  CRANBERRY PO Take 3,600 mg by mouth daily.   Yes Historical Provider, MD  diphenhydramine-acetaminophen (TYLENOL PM) 25-500 MG TABS Take 1 tablet by mouth at bedtime as needed.   Yes Historical Provider, MD  DULoxetine (CYMBALTA) 60 MG capsule TAKE 1 CAPSULE (60 MG TOTAL) BY MOUTH DAILY. Patient taking differently: TAKE 1 CAPSULE (60 MG TOTAL) BY MOUTH DAILY as needed for mood 11/30/14  Yes Timoteo Gaul, FNP  hydrochlorothiazide (HYDRODIURIL) 12.5 MG tablet TAKE 1 TABLET EVERY DAY 07/23/14  Yes Timoteo Gaul, FNP  lisinopril (PRINIVIL,ZESTRIL) 20 MG tablet TAKE 1 TABLET EVERY DAY 07/23/14  Yes Timoteo Gaul, FNP  loratadine (CLARITIN) 10 MG tablet Take 1 tablet (10 mg total) by mouth daily. 08/06/12  Yes Timoteo Gaul, FNP  Multiple Vitamin (MULTIVITAMIN) tablet Take 1 tablet by mouth daily.   Yes Historical Provider, MD   omeprazole (PRILOSEC) 40 MG capsule Take 1 capsule (40 mg total) by mouth daily. Twice daily Patient taking differently: Take 40 mg by mouth at bedtime as needed (heartburn/acid reflux). Twice daily 12/07/14  Yes Timoteo Gaul, FNP  traZODone (DESYREL) 50 MG tablet TAKE 0.5-1 TABLETS (25-50 MG TOTAL) BY MOUTH AT BEDTIME AS NEEDED FOR SLEEP. Patient not taking: Reported on 03/01/2015 11/08/14   Timoteo Gaul, FNP   No Known Allergies  FAMILY HISTORY:  has no family status information on file.  SOCIAL HISTORY:  reports that she has never smoked. She has never used smokeless tobacco. She reports that she drinks alcohol. She reports that she does not use illicit drugs.  REVIEW OF SYSTEMS: NA  SUBJECTIVE:   VITAL SIGNS: Temp:  [97.3 F (36.3 C)-97.6 F (36.4 C)] 97.6 F (36.4 C) (03/22 1203) Pulse Rate:  [70-90] 70 (03/22 1350) Resp:  [20-22] 22 (03/22 1203) BP: (96-117)/(50-67) 100/50 mmHg (03/22 1350) SpO2:  [92 %-98 %] 98 % (03/22 1350) Weight:  [125 lb (56.7 kg)] 125 lb (56.7 kg) (03/22 1203) HEMODYNAMICS:   VENTILATOR SETTINGS:   INTAKE / OUTPUT: No intake or output data in the 24 hours ending 03/01/15 1355  PHYSICAL EXAMINATION: General:  Frail elderly female sedated on vent Neuro: sedated HEENT:  PERL 3 mm, no jvd Cardiovascular:  hsr rrr Lungs:  CTA Abdomen:  Soft +bs Musculoskeletal:  intact Skin:  warm  LABS:  CBC  Recent Labs Lab 03/01/15 0958  WBC 13.1*  HGB 8.8*  HCT 28.2*  PLT 160   Coag's  Recent Labs Lab 03/01/15 0958  INR 1.26   BMET  Recent Labs Lab 03/01/15 0958  NA 145  K 3.2*  CL 109  CO2 27  BUN 63*  CREATININE 0.80  GLUCOSE 186*   Electrolytes  Recent Labs Lab 03/01/15 0958  CALCIUM 8.7   Sepsis Markers No results for input(s): LATICACIDVEN, PROCALCITON, O2SATVEN in the last 168 hours. ABG No results for input(s): PHART, PCO2ART, PO2ART in the last 168 hours. Liver Enzymes  Recent Labs Lab  03/01/15 0958  AST 20  ALT 19  ALKPHOS 92  BILITOT 0.6  ALBUMIN 3.5   Cardiac Enzymes No results for input(s): TROPONINI, PROBNP in the last 168 hours. Glucose No results for input(s): GLUCAP in the last 168 hours.  Imaging No results found.   ASSESSMENT / PLAN:  PULMONARY OETT 3/22 for Endo>> A: Intubated for procedures P:   Vent bundle Extubate when stable from a bleeding standpoint. Begin PS trials in AM. Titrate O2 for sat of 88-92%. ABG and adjust vent accordingly. CXR upon arrival to the ICU and in AM.  CARDIOVASCULAR CVL A:  Htn Possible hypotension P:  HD monitoring Hold antihypertensives  Check BNP (will be getting a lot of volume).  RENAL A: hypokalemia P:   BMET in AM. Replace electrolytes as indicated.  GASTROINTESTINAL A:   GIB P:   Per GI NPO To IR for embolization. Protonix BID. ?octreotide given findings of enlarged esophageal veins (not varices) will defer to GI on that.   HEMATOLOGIC A:   Blood loss P:  Transfuse for hgb of 8 H&H q6. Type and screen. Give two units of blood now.  INFECTIOUS A: No acute issue P:   Monitor fever curve and WBC.  ENDOCRINE A:   No acute issue  P:   Monitor while NPO  NEUROLOGIC A: Intact prior to intubation P:   RASS goal-1 Sedated while on vent Propofol drip and fentanyl pushes as ordered.  FAMILY  - Updates: No family bedside.  TODAY'S SUMMARY:  81 yo WM who presented to [facility] with new onset hematemesis and was taken to Endo suite for scope without source identification. She was intubated for the procedure and remains intubated. She is going to IR intubated and PCCM asked to manage vent and her car while intubated. She is not on anticoagulants as an outpatient. Currently HD stable and in route to IR.  Richardson Landry Minor ACNP Maryanna Shape PCCM Pager [phone] till 3 pm If no answer page [phone]  Above note edited in full.  I changed the plan as above.  Patient appears hemodynamically  stable for now.  Will send to IR then to the ICU.  If all goes well anticipate will be able to extubate by AM.  The patient is critically ill with multiple organ systems failure and requires high complexity decision making for assessment and support, frequent evaluation and titration of therapies, application of advanced monitoring technologies and extensive interpretation of multiple databases.   Critical Care Time devoted to patient care services described in this note is  45  Minutes. This time reflects time of care of this signee Dr Jennet Maduro. This critical care time does not reflect procedure time, or teaching time or supervisory time of PA/NP/Med student/Med Resident etc but could involve care discussion time.  Rush Farmer, M.D. [facility] Pulmonary/Critical Care Medicine. Pager: [phone]. After hours pager: [phone].  03/01/2015, 2:00 PM

## 2015-03-01 NOTE — Progress Notes (Signed)
Pt intubated, Dr. Paulita Fujita  Spoke with Dr. Earleen Newport Interventional to go to Interventional radiology.Marland KitchenExtensive bleeding continues, unable to determine site except gastric in origin..the patient stable ie labs, compensation at present.....  Dr. Paulita Fujita coordinating with [facility], Hospitalist, Dr. Ruby Cola to form plan of care..the patient to be typed and crossed

## 2015-03-01 NOTE — Anesthesia Procedure Notes (Signed)
Procedure Name: Intubation Date/Time: 03/01/2015 12:49 PM Performed by: Carleene Cooper A Pre-anesthesia Checklist: Patient identified, Timeout performed, Emergency Drugs available, Suction available and Patient being monitored Patient Re-evaluated:Patient Re-evaluated prior to inductionOxygen Delivery Method: Circle system utilized Preoxygenation: Pre-oxygenation with 100% oxygen (RSI with cricoid pressure by Dr. Jillyn Hidden) Intubation Type: Rapid sequence, IV induction and Cricoid Pressure applied Laryngoscope Size: Mac and 3 Grade View: Grade I Tube type: Oral Number of attempts: 1 Airway Equipment and Method: Stylet Secured at: 21 cm Tube secured with: Tape Dental Injury: Teeth and Oropharynx as per pre-operative assessment

## 2015-03-02 ENCOUNTER — Inpatient Hospital Stay (HOSPITAL_COMMUNITY): Payer: Commercial Managed Care - HMO

## 2015-03-02 ENCOUNTER — Encounter (HOSPITAL_COMMUNITY)
Admission: EM | Disposition: A | Discharge status: Home Health | Payer: Self-pay | Source: Home / Self Care | Attending: Pulmonary Disease

## 2015-03-02 ENCOUNTER — Encounter (HOSPITAL_COMMUNITY): Payer: Self-pay | Admitting: Gastroenterology

## 2015-03-02 HISTORY — PX: ESOPHAGOGASTRODUODENOSCOPY: SHX5428

## 2015-03-02 LAB — CBC
HCT: 22 % — ABNORMAL LOW (ref 36.0–46.0)
Hemoglobin: 7.2 g/dL — ABNORMAL LOW (ref 12.0–15.0)
MCH: 29.8 pg (ref 26.0–34.0)
MCHC: 32.7 g/dL (ref 30.0–36.0)
MCV: 90.9 fL (ref 78.0–100.0)
PLATELETS: 178 10*3/uL (ref 150–400)
RBC: 2.42 MIL/uL — ABNORMAL LOW (ref 3.87–5.11)
RDW: 14.7 % (ref 11.5–15.5)
WBC: 23.1 10*3/uL — ABNORMAL HIGH (ref 4.0–10.5)

## 2015-03-02 LAB — CBC WITH DIFFERENTIAL/PLATELET
BASOS PCT: 0 % (ref 0–1)
Basophils Absolute: 0 10*3/uL (ref 0.0–0.1)
EOS ABS: 0 10*3/uL (ref 0.0–0.7)
Eosinophils Relative: 0 % (ref 0–5)
HCT: 20.9 % — ABNORMAL LOW (ref 36.0–46.0)
HEMOGLOBIN: 6.6 g/dL — AB (ref 12.0–15.0)
Lymphocytes Relative: 11 % — ABNORMAL LOW (ref 12–46)
Lymphs Abs: 1.3 10*3/uL (ref 0.7–4.0)
MCH: 28.8 pg (ref 26.0–34.0)
MCHC: 31.6 g/dL (ref 30.0–36.0)
MCV: 91.3 fL (ref 78.0–100.0)
Monocytes Absolute: 0.6 10*3/uL (ref 0.1–1.0)
Monocytes Relative: 5 % (ref 3–12)
NEUTROS PCT: 84 % — AB (ref 43–77)
Neutro Abs: 10.1 10*3/uL — ABNORMAL HIGH (ref 1.7–7.7)
PLATELETS: 120 10*3/uL — AB (ref 150–400)
RBC: 2.29 MIL/uL — ABNORMAL LOW (ref 3.87–5.11)
RDW: 14.6 % (ref 11.5–15.5)
WBC: 12 10*3/uL — ABNORMAL HIGH (ref 4.0–10.5)

## 2015-03-02 LAB — BLOOD GAS, ARTERIAL
ACID-BASE DEFICIT: 0.5 mmol/L (ref 0.0–2.0)
Bicarbonate: 24 mEq/L (ref 20.0–24.0)
DRAWN BY: 331471
FIO2: 0.3 %
LHR: 16 {breaths}/min
MECHVT: 420 mL
O2 SAT: 95.6 %
PEEP: 5 cmH2O
Patient temperature: 98.6
TCO2: 22.1 mmol/L (ref 0–100)
pCO2 arterial: 41 mmHg (ref 35.0–45.0)
pH, Arterial: 7.385 (ref 7.350–7.450)
pO2, Arterial: 80.4 mmHg (ref 80.0–100.0)

## 2015-03-02 LAB — BASIC METABOLIC PANEL
ANION GAP: 8 (ref 5–15)
BUN: 54 mg/dL — ABNORMAL HIGH (ref 6–23)
CO2: 25 mmol/L (ref 19–32)
CREATININE: 0.69 mg/dL (ref 0.50–1.10)
Calcium: 8.2 mg/dL — ABNORMAL LOW (ref 8.4–10.5)
Chloride: 110 mmol/L (ref 96–112)
GFR calc non Af Amer: 78 mL/min — ABNORMAL LOW (ref >90)
Glucose, Bld: 146 mg/dL — ABNORMAL HIGH (ref 70–99)
POTASSIUM: 3.7 mmol/L (ref 3.5–5.1)
SODIUM: 143 mmol/L (ref 135–145)

## 2015-03-02 LAB — PHOSPHORUS: Phosphorus: 3.9 mg/dL (ref 2.3–4.6)

## 2015-03-02 LAB — HEMOGLOBIN AND HEMATOCRIT, BLOOD
HCT: 23.6 % — ABNORMAL LOW (ref 36.0–46.0)
HCT: 21.6 % — ABNORMAL LOW (ref 36.0–46.0)
HEMATOCRIT: 25.6 % — AB (ref 36.0–46.0)
HEMOGLOBIN: 7.2 g/dL — AB (ref 12.0–15.0)
Hemoglobin: 8.6 g/dL — ABNORMAL LOW (ref 12.0–15.0)
Hemoglobin: 7.8 g/dL — ABNORMAL LOW (ref 12.0–15.0)

## 2015-03-02 LAB — PREPARE RBC (CROSSMATCH)

## 2015-03-02 LAB — MAGNESIUM: Magnesium: 1.8 mg/dL (ref 1.5–2.5)

## 2015-03-02 LAB — GLUCOSE, CAPILLARY: Glucose-Capillary: 104 mg/dL — ABNORMAL HIGH (ref 70–99)

## 2015-03-02 SURGERY — EGD (ESOPHAGOGASTRODUODENOSCOPY)
Anesthesia: Moderate Sedation

## 2015-03-02 MED ORDER — SODIUM CHLORIDE 0.9 % IV SOLN: Freq: Once | INTRAVENOUS | Status: DC

## 2015-03-02 MED ORDER — METOCLOPRAMIDE HCL 5 MG/ML IJ SOLN
10.0000 mg | Freq: Once | INTRAMUSCULAR | Status: AC
  Administered 2015-03-02: 10 mg via INTRAVENOUS
  Filled 2015-03-02: qty 2

## 2015-03-02 MED ORDER — PROPOFOL BOLUS VIA INFUSION
10.0000 mg | Freq: Once | INTRAVENOUS | Status: AC
  Administered 2015-03-02 (×2): 10 mg via INTRAVENOUS

## 2015-03-02 NOTE — Progress Notes (Signed)
PULMONARY / CRITICAL CARE MEDICINE   Name: Tiffany [name] MRN: [id number] DOB: 1928/12/29    ADMISSION DATE:  03/01/2015 CONSULTATION DATE:3/22  REFERRING MD : outlaw  CHIEF COMPLAINT:  Hematemesis   INITIAL PRESENTATION: Hematemesis   STUDIES:    SIGNIFICANT EVENTS: 3/22 intubated for egd   HISTORY OF PRESENT ILLNESS:  81 yo WM who presented to [facility] with new onset hematemesis and was taken to Endo suite for scope without source identification. She was intubated for the procedure and remains intubated. She is going to IR intubated and PCCM asked to manage vent and her car while intubated. She is not on anticoagulants as an outpatient. Currently HD stable and in route to IR.   SUBJECTIVE:  NAD VITAL SIGNS: Temp:  [96.7 F (35.9 C)-98.5 F (36.9 C)] 98.3 F (36.8 C) (03/23 0750) Pulse Rate:  [67-90] 80 (03/23 0800) Resp:  [14-26] 20 (03/23 0800) BP: (91-172)/(34-75) 137/56 mmHg (03/23 0800) SpO2:  [92 %-100 %] 100 % (03/23 0800) FiO2 (%):  [30 %-100 %] 30 % (03/23 0743) Weight:  [125 lb (56.7 kg)-135 lb 12.9 oz (61.6 kg)] 135 lb 12.9 oz (61.6 kg) (03/23 0500) HEMODYNAMICS:   VENTILATOR SETTINGS: Vent Mode:  [-] PSV;CPAP FiO2 (%):  [30 %-100 %] 30 % Set Rate:  [16 bmp] 16 bmp Vt Set:  [420 mL] 420 mL PEEP:  [5 cmH20] 5 cmH20 Pressure Support:  [5 cmH20] 5 cmH20 Plateau Pressure:  [11 cmH20-15 cmH20] 11 cmH20 INTAKE / OUTPUT:  Intake/Output Summary (Last 24 hours) at 03/02/15 0915 Last data filed at 03/02/15 0900  Gross per 24 hour  Intake 2707.65 ml  Output    950 ml  Net 1757.65 ml    PHYSICAL EXAMINATION: General:  Frail elderly female sedated on vent, opens eyes Neuro: sedated, but follows commands HEENT:  PERL 3 mm, no jvd Cardiovascular:  hsr rrr Lungs:  CTA Abdomen:  Soft +bs, NGT dark red drainage Musculoskeletal:  intact Skin:  warm  LABS:  CBC  Recent Labs Lab 03/01/15 0958 03/01/15 1400 03/01/15 1945 03/02/15 0300  WBC 13.1* 12.0*   --  23.1*  HGB 8.8* 6.6* 8.6* 7.2*  HCT 28.2* 20.9* 27.9* 22.0*  PLT 160 120*  --  178   Coag's  Recent Labs Lab 03/01/15 0958 03/01/15 1400  APTT  --  30  INR 1.26 1.25   BMET  Recent Labs Lab 03/01/15 0958 03/01/15 1400 03/02/15 0300  NA 145 143 143  K 3.2* 4.8 3.7  CL 109 110 110  CO2 27 26 25   BUN 63* 55* 54*  CREATININE 0.80 0.68 0.69  GLUCOSE 186* 151* 146*   Electrolytes  Recent Labs Lab 03/01/15 0958 03/01/15 1400 03/02/15 0300  CALCIUM 8.7 8.1* 8.2*  MG  --   --  1.8  PHOS  --   --  3.9   Sepsis Markers No results for input(s): LATICACIDVEN, PROCALCITON, O2SATVEN in the last 168 hours. ABG  Recent Labs Lab 03/01/15 1358 03/02/15 0730  PHART 7.338* 7.385  PCO2ART 47.4* 41.0  PO2ART 432.0* 80.4   Liver Enzymes  Recent Labs Lab 03/01/15 0958 03/01/15 1400  AST 20 21  ALT 19 16  ALKPHOS 92 72  BILITOT 0.6 0.6  ALBUMIN 3.5 3.0*   Cardiac Enzymes No results for input(s): TROPONINI, PROBNP in the last 168 hours. Glucose No results for input(s): GLUCAP in the last 168 hours.  Imaging Dg Chest Port 1 View  03/01/2015   CLINICAL DATA:  Status  post manipulation of in situ central line  EXAM: PORTABLE CHEST - 1 VIEW  COMPARISON:  09/12/2011  FINDINGS: ET tube tip is above the carina. A central venous catheter is not identified. Normal heart size. There is calcified plaque within the aortic arch. Chronic right posterior lateral rib fracture deformities are noted.  IMPRESSION: 1. ET tube tip in satisfactory position above the carina. 2. No central venous catheter identified.   Electronically Signed   By: Kerby Moors M.D.   On: 03/01/2015 19:45   Ct Angio Abd/pel W/ And/or W/o  03/01/2015   CLINICAL DATA:  Hematemesis and significant blood loss with endoscopy demonstrating blood in the distal esophagus and proximal stomach.  EXAM: CTA ABDOMEN AND PELVIS WITHOUT CONTRAST  TECHNIQUE: Multidetector CT imaging of the abdomen and pelvis was performed  using the standard protocol during bolus administration of intravenous contrast. Multiplanar reconstructed images and MIPs were obtained and reviewed to evaluate the vascular anatomy. Arterial and venous phases of imaging were performed of the abdomen and pelvis.  CONTRAST:  139mL OMNIPAQUE IOHEXOL 350 MG/ML SOLN  COMPARISON:  None.  FINDINGS: The distal esophagus contains fluid and is moderately distended. Just above the GE junction, some prominent mucosal vessels are identified as well as mucosal hyperemia of the distal esophagus. Small vessels do not appear to represent typical esophagogastric varices. The stomach shows hyperemia without varices. No contrast extravasation is identified by CTA.  Arterial anatomy appears normal with normal patency of the celiac axis and its branches. A small left gastric artery is identified. The splenic artery and hepatic arteries are normally patent. Mild plaque is present at the origin of the superior mesenteric artery without significant stenosis. Distal SMA branches appear patent. The inferior mesenteric artery is small in caliber and patent.  There is no evidence of aortic aneurysmal disease. A single left renal artery shows no significant stenosis. There likely is some degree of stenosis of the proximal right renal artery approaching 60- 70% in caliber. Bilateral iliac and common femoral arteries are normally patent.  The liver shows mild steatosis without overt cirrhotic changes. No hepatic masses or biliary dilatation identified. The spleen is mildly enlarged. The gallbladder, pancreas, adrenal glands and kidneys are unremarkable. There is a simple cyst of the left kidney. Bowel shows moderate fecal material throughout the colon. There is no evidence of acute bowel obstruction. No free air is identified. A small amount of free fluid is present adjacent to the inferior liver. No masses or enlarged lymph nodes are seen. No hernias are identified. The bladder is unremarkable.  The uterus has been removed. Extensive degenerative disease is noted of the lumbar spine with evidence of prior lumbar fusion.  Review of the MIP images confirms the above findings.  IMPRESSION: 1. Fluid filled distal esophagus and stomach with mucosal hyperemia in the distal esophagus and throughout the stomach. There are some prominent mucosal vessels identified in the distal esophagus without evidence of contrast extravasation or typical esophageal varices. No gastric varices or masses are identified. 2. No vascular malformations identified. There is moderate stenosis of the proximal right renal artery. 3. Hepatic steatosis without overt cirrhosis. The spleen is mildly enlarged.   Electronically Signed   By: Aletta Edouard M.D.   On: 03/01/2015 15:45     ASSESSMENT / PLAN:  PULMONARY OETT 3/22 for Endo>> A: Intubated for procedures P:   Vent bundle Extubate when stable from a bleeding standpoint. Begin PS trials in AM. No extubation due to impending EGD  Titrate O2 for sat of 88-92%. CXR  AM.  CARDIOVASCULAR CVL A:  Htn Possible hypotension P:  HD monitoring Hold antihypertensives  Check BNP 55.6 (will be getting a lot of volume).  RENAL Lab Results  Component Value Date   CREATININE 0.69 03/02/2015   CREATININE 0.68 03/01/2015   CREATININE 0.80 03/01/2015    A: hypokalemia P:   BMET in AM. Replace electrolytes as indicated.  GASTROINTESTINAL A:   GIB P:   Per GI NPO To IR for embolization. Protonix BID. ? octreotide given findings of enlarged esophageal veins (not varices) will defer to GI on that.   HEMATOLOGIC  Recent Labs  03/01/15 1945 03/02/15 0300  HGB 8.6* 7.2*    A:   Blood loss P:  Transfuse for hgb of 8 H&H q6. Type and screen.   INFECTIOUS A: No acute issue P:   Monitor fever curve and WBC.  ENDOCRINE A:   No acute issue  P:   Monitor while NPO  NEUROLOGIC A: Intact prior to intubation P:   RASS goal-1 Sedated while on  vent Propofol drip and fentanyl pushes as ordered.  FAMILY  - Updates: Family updated at bedside.  TODAY'S SUMMARY:  81 yo WM who presented to [facility] with new onset hematemesis and was taken to Endo suite for scope without source identification. She was intubated for the procedure and remains intubated.  3/23 she continues to bleed and going to have bedside EGD per GI and possible return to IR therefore no extubation at this time.   Richardson Landry Minor ACNP Maryanna Shape PCCM Pager [phone] till 3 pm If no answer page [phone]  Will maintain intubated today, GI to scope again today and will likely need to go back to the OR, from a physical exam standpoint she is stable on the vent and I would anticipate she will wean fine once procedures are complete from a GI standpoint.  Maintain on full vent support for now.  Transfuse as ordered.  The patient is critically ill with multiple organ systems failure and requires high complexity decision making for assessment and support, frequent evaluation and titration of therapies, application of advanced monitoring technologies and extensive interpretation of multiple databases.   Critical Care Time devoted to patient care services described in this note is  35  Minutes. This time reflects time of care of this signee Dr Jennet Maduro. This critical care time does not reflect procedure time, or teaching time or supervisory time of PA/NP/Med student/Med Resident etc but could involve care discussion time.  Rush Farmer, M.D. [facility] Pulmonary/Critical Care Medicine. Pager: [phone]. After hours pager: [phone].

## 2015-03-02 NOTE — Progress Notes (Signed)
Attempts made for arterial blood sample by 2 RT without success. RN and MD aware.

## 2015-03-02 NOTE — Progress Notes (Signed)
Subjective: Still intubated, but awake. Some persistent blood in NGT. Drop in Hgb overnight.  Objective: Vital signs in last 24 hours: Temp:  [96.7 F (35.9 C)-98.5 F (36.9 C)] 98.3 F (36.8 C) (03/23 0750) Pulse Rate:  [67-90] 74 (03/23 0700) Resp:  [14-26] 17 (03/23 0700) BP: (91-172)/(34-75) 127/50 mmHg (03/23 0700) SpO2:  [92 %-100 %] 100 % (03/23 0700) FiO2 (%):  [30 %-100 %] 30 % (03/23 0743) Weight:  [56.7 kg (125 lb)-61.6 kg (135 lb 12.9 oz)] 61.6 kg (135 lb 12.9 oz) (03/23 0500) Weight change:    PE: GEN:  Intubated, but awake; NGT in place ABD:  Soft  Lab Results: CBC    Component Value Date/Time   WBC 23.1* 03/02/2015 0300   RBC 2.42* 03/02/2015 0300   HGB 7.2* 03/02/2015 0300   HCT 22.0* 03/02/2015 0300   PLT 178 03/02/2015 0300   MCV 90.9 03/02/2015 0300   MCH 29.8 03/02/2015 0300   MCHC 32.7 03/02/2015 0300   RDW 14.7 03/02/2015 0300   LYMPHSABS 1.3 03/01/2015 1400   MONOABS 0.6 03/01/2015 1400   EOSABS 0.0 03/01/2015 1400   BASOSABS 0.0 03/01/2015 1400   CMP     Component Value Date/Time   NA 143 03/02/2015 0300   K 3.7 03/02/2015 0300   CL 110 03/02/2015 0300   CO2 25 03/02/2015 0300   GLUCOSE 146* 03/02/2015 0300   BUN 54* 03/02/2015 0300   CREATININE 0.69 03/02/2015 0300   CALCIUM 8.2* 03/02/2015 0300   PROT 5.0* 03/01/2015 1400   ALBUMIN 3.0* 03/01/2015 1400   AST 21 03/01/2015 1400   ALT 16 03/01/2015 1400   ALKPHOS 72 03/01/2015 1400   BILITOT 0.6 03/01/2015 1400   GFRNONAA 78* 03/02/2015 0300   GFRAA >90 03/02/2015 0300   Assessment:  1.  Upper GI tract bleeding, suspected in region of proximal stomach, exact etiology unclear due to presence of large overlying clot.  No evidence of varices or cirrhosis seen on CT scan yesterday. 2.  Persistent downtrend in Hgb. Received 1 unit PRBCs today.  Plan:  1.  Metoclopramide 10 mg IV now. 2.  Awaiting follow-up CBC. 3.  Repeat endoscopy later today, but I counseled patient/family  that we might again be limited by presence of overlying clot in proximal stomach.   Landry Dyke 03/02/2015, 8:44 AM

## 2015-03-02 NOTE — Progress Notes (Signed)
EGD done; full report to follow.  Remaining clot in proximal stomach was removed through mouth or suctioned through endoscope.  In cardia, there was 96mm diameter protuberance consistent with visible vessel, most consistent with Dieulafoy lesion.  Two hemoclips were applied over this vesel without complication.  There was no active bleeding to the level of the second portion of the duodenum at the end of our procedure.  Suggest:  Wean from ventilator as tolerated; once weaned off ventilator, trial of clear liquids is ok; [facility] will follow-up tomorrow.

## 2015-03-02 NOTE — Op Note (Signed)
[facility] [place], [id number]   ENDOSCOPY PROCEDURE REPORT  PATIENT: Tiffany [name], Tiffany [name]  MR#: [id number] BIRTHDATE: January 02, 1929 , 85  yrs. old GENDER: female ENDOSCOPIST: Arta Silence, MD REFERRED BY:  Rush Farmer, M.D. PROCEDURE DATE:  03/14/2015 PROCEDURE:  EGD w/ control of bleeding ASA CLASS:     Class III INDICATIONS:  hematemesis. MEDICATIONS: Titrated propofol (patient is intubated) TOPICAL ANESTHETIC:  DESCRIPTION OF PROCEDURE: After the risks benefits and alternatives of the procedure were thoroughly explained, informed consent was obtained.  The Pentax Gastroscope [id number] endoscope was introduced through the mouth and advanced to the second portion of the duodenum. The instrument was slowly withdrawn as the mucosa was fully examined. Estimated blood loss is zero unless otherwise noted in this procedure report.    Findings:  Prominent vasculature in mid-to-distal esophagus, non-bleeding and without bleeding stigmata, and not typical appearance of varices.  Clot burden in proximal stomach less, but still obscured several views; with time, able to suction some of the clot through the gastroscope and was able to pull some through the oropharynx.  With time, was able to obtain good views of the proximal stomach.  There was evidence of 73mm vascular protuberance in the cardia, most consistent with Dieulafoy lesion, across which two hemoclips were applied without immediate complication. Retroflexed views of the cardia showed no evidence of gastric varices.  Remainder of exam to the second portion of the duodenum was normal.  No active bleeding was witnessed at time of conclusion of procedure.              The scope was then withdrawn from the patient and the procedure completed.  COMPLICATIONS: There were no immediate complications.  ENDOSCOPIC IMPRESSION:     As above.  Suspected proximal gastric Dieulafoy, likely cause of prior  bleeding, hemoclips applied as above.  No evidence of active bleeding at conclusion of our exam.  RECOMMENDATIONS:     1.  Watch for potential complications of procedure. 2.  Pantoprazole infusion today, consider transition to oral formulation tomorrow. 3.  OK to try to wean patient from ventilator. 4.  Would NOT replace nasogastric tube, for fear of dislodging the hemoclips applied to the proximal stomach. 5.  Clear liquids ok once patient weaned from ventilator. 6.  [facility] will follow.  eSigned:  Arta Silence, MD 03/14/15 5:21 PM   CC:  CPT CODES: ICD CODES: The ICD and CPT codes recommended by this software are interpretations from the data that the clinical staff has captured with the software.  The verification of the translation of this report to the ICD and CPT codes and modifiers is the soleresponsibility of the health care institution and practicing physician where this report was generated.  [place]. will not be held responsible for the validity of the ICD and CPT codes included on this report.  AMA assumes no liability for data contained or not contained herein. CPT is a Designer, television/film set of the [employer].

## 2015-03-02 NOTE — Care Management Note (Signed)
CARE MANAGEMENT NOTE 03/02/2015  Patient:  Tiffany [name], Tiffany [name]   Account Number:  [id number]  Date Initiated:  03/02/2015  Documentation initiated by:  DAVIS,RHONDA  Subjective/Objective Assessment:   81 yo WM who presented to [facility] with new onset hematemesis and was taken to Endo suite for scope without source identification. She was intubated for the procedure and remains intubated.     Action/Plan:   tbd determined gi bld continues on [id number] per notes   Anticipated DC Date:  03/05/2015   Anticipated DC Plan:    In-house referral  NA      DC Planning Services  CM consult      [facility] Choice  NA   Choice offered to / List presented to:  NA           Status of service:  In process, will continue to follow Medicare Important Message given?   (If response is "NO", the following Medicare IM given date fields will be blank) Date Medicare IM given:   Medicare IM given by:   Date Additional Medicare IM given:   Additional Medicare IM given by:    Discharge Disposition:    Per UR Regulation:  Reviewed for med. necessity/level of care/duration of stay  If discussed at [place] of Stay Meetings, dates discussed:    Comments:  March 02, 2015/Rhonda L. Rosana Hoes, RN, BSN, CCM. Case Management [place] [phone] No discharge needs present of time of review.

## 2015-03-03 ENCOUNTER — Inpatient Hospital Stay (HOSPITAL_COMMUNITY): Payer: Commercial Managed Care - HMO

## 2015-03-03 ENCOUNTER — Encounter (HOSPITAL_COMMUNITY): Payer: Self-pay | Admitting: Gastroenterology

## 2015-03-03 LAB — PROTIME-INR
INR: 1.3 (ref 0.00–1.49)
PROTHROMBIN TIME: 16.4 s — AB (ref 11.6–15.2)

## 2015-03-03 LAB — PHOSPHORUS: Phosphorus: 3 mg/dL (ref 2.3–4.6)

## 2015-03-03 LAB — BASIC METABOLIC PANEL
Anion gap: 7 (ref 5–15)
BUN: 48 mg/dL — ABNORMAL HIGH (ref 6–23)
CALCIUM: 8.1 mg/dL — AB (ref 8.4–10.5)
CO2: 25 mmol/L (ref 19–32)
CREATININE: 0.83 mg/dL (ref 0.50–1.10)
Chloride: 114 mmol/L — ABNORMAL HIGH (ref 96–112)
GFR calc Af Amer: 74 mL/min — ABNORMAL LOW (ref >90)
GFR calc non Af Amer: 63 mL/min — ABNORMAL LOW (ref >90)
GLUCOSE: 128 mg/dL — AB (ref 70–99)
Potassium: 3.8 mmol/L (ref 3.5–5.1)
Sodium: 146 mmol/L — ABNORMAL HIGH (ref 135–145)

## 2015-03-03 LAB — HEMOGLOBIN AND HEMATOCRIT, BLOOD
HCT: 22.1 % — ABNORMAL LOW (ref 36.0–46.0)
HCT: 23.1 % — ABNORMAL LOW (ref 36.0–46.0)
HEMATOCRIT: 22.6 % — AB (ref 36.0–46.0)
Hemoglobin: 7.6 g/dL — ABNORMAL LOW (ref 12.0–15.0)
Hemoglobin: 7.2 g/dL — ABNORMAL LOW (ref 12.0–15.0)
Hemoglobin: 7.5 g/dL — ABNORMAL LOW (ref 12.0–15.0)

## 2015-03-03 LAB — APTT: aPTT: 30 seconds (ref 24–37)

## 2015-03-03 LAB — MAGNESIUM: Magnesium: 1.8 mg/dL (ref 1.5–2.5)

## 2015-03-03 MED ORDER — MAGNESIUM SULFATE 2 GM/50ML IV SOLN
2.0000 g | Freq: Once | INTRAVENOUS | Status: AC
  Administered 2015-03-03: 2 g via INTRAVENOUS
  Filled 2015-03-03: qty 50

## 2015-03-03 MED ORDER — FENTANYL CITRATE 0.05 MG/ML IJ SOLN
12.5000 ug | INTRAMUSCULAR | Status: DC | PRN
  Administered 2015-03-03 – 2015-03-04 (×3): 25 ug via INTRAVENOUS
  Administered 2015-03-05: 12.5 ug via INTRAVENOUS
  Administered 2015-03-05: 25 ug via INTRAVENOUS
  Filled 2015-03-03 (×5): qty 2

## 2015-03-03 NOTE — Progress Notes (Signed)
INITIAL NUTRITION ASSESSMENT  DOCUMENTATION CODES Per approved criteria  -Non-severe (moderate) malnutrition in the context of chronic illness   Pt meet criteria for non-severe (moderate) malnutrition in the context of chronic illness as evidenced by moderate fat and muscle mass depletion.   INTERVENTION: -Monitor PO intake   NUTRITION DIAGNOSIS: Inadequate oral intake related to decreased appetite as evidenced by GI discomfort/bleed.   Goal: Pt to meet >/= 90% of estimated needs  Monitor:  PO intake, weight trends, labs  Reason for Assessment: MST/ New vent  81 y.o. female  Admitting Dx: Acute Respiratory Failure  ASSESSMENT: Pt presenting with hematemesis with a history of intermittent black stools over the past year or two.  Pt with GI bleed, intubated 3/22 until bleeding stable, extubated 3/24.  SLP eval pending, diet advancement per GI.   Pt family reports a gradual wt loss over the past few months, although wt records show an 8 lb wt gain over the past 4 months.  Pt family reports pt was eating well prior to onset of symptoms.  Pt refuses supplementation at this time. Pt family expects she will eat well once diet advanced.  Nutrition Focused Physical Exam:  Subcutaneous Fat:  Orbital Region: moderate depletion Upper Arm Region: moderate depletion Thoracic and Lumbar Region: n/a  Muscle:  Temple Region: moderate depletion Clavicle Bone Region: severe depletion Clavicle and Acromion Bone Region: severe depletion Scapular Bone Region: n/a Dorsal Hand: WDL Patellar Region: moderate depletion Anterior Thigh Region: severe depletion Posterior Calf Region: severe depletion  Edema: none present  Height: Ht Readings from Last 1 Encounters:  03/01/15 5\' 5"  (1.651 m)    Weight: Wt Readings from Last 1 Encounters:  03/03/15 139 lb 1.8 oz (63.1 kg)    Ideal Body Weight: 130 lbs (59 lbs)  % Ideal Body Weight: 107%  Wt Readings from Last 10 Encounters:   03/03/15 139 lb 1.8 oz (63.1 kg)  11/26/14 131 lb 14.4 oz (59.829 kg)  09/21/14 132 lb (59.875 kg)  07/23/14 132 lb (59.875 kg)  06/30/14 133 lb (60.328 kg)  05/12/14 135 lb (61.236 kg)  03/30/14 133 lb (60.328 kg)  02/23/14 134 lb 6.4 oz (60.963 kg)  01/20/14 142 lb (64.411 kg)  07/20/13 147 lb (66.679 kg)    Usual Body Weight: n/a  % Usual Body Weight: n/a  BMI:  Body mass index is 23.15 kg/(m^2).  Estimated Nutritional Needs: Kcal: 1800-2000 Protein: 90-105 g protein Fluid: >/= 1.9 L  Skin: WDL  Diet Order: Diet NPO time specified  EDUCATION NEEDS: -No education needs identified at this time   Intake/Output Summary (Last 24 hours) at 03/03/15 0953 Last data filed at 03/03/15 0900  Gross per 24 hour  Intake 2758.55 ml  Output    770 ml  Net 1988.55 ml    Last BM: none charted   Labs:   Recent Labs Lab 03/01/15 1400 03/02/15 0300 03/03/15 0330  NA 143 143 146*  K 4.8 3.7 3.8  CL 110 110 114*  CO2 26 25 25   BUN 55* 54* 48*  CREATININE 0.68 0.69 0.83  CALCIUM 8.1* 8.2* 8.1*  MG  --  1.8 1.8  PHOS  --  3.9 3.0  GLUCOSE 151* 146* 128*    CBG (last 3)   Recent Labs  03/02/15 1356  GLUCAP 104*    Scheduled Meds: . sodium chloride   Intravenous Once  . antiseptic oral rinse  7 mL Mouth Rinse QID  . chlorhexidine  15 mL Mouth  Rinse BID  . pantoprazole (PROTONIX) IV  40 mg Intravenous Q12H    Continuous Infusions: . sodium chloride 100 mL/hr at 03/03/15 0900  . propofol 30 mcg/kg/min (03/03/15 0915)    Past Medical History  Diagnosis Date  . Hypertension   . Arthritis   . Dementia     Past Surgical History  Procedure Laterality Date  . Back surgery  09/25/2011  . Abdominal hysterectomy    . Esophagogastroduodenoscopy N/A 03/01/2015    Procedure: ESOPHAGOGASTRODUODENOSCOPY (EGD);  Surgeon: Arta Silence, MD;  Location: [facility];  Service: Endoscopy;  Laterality: N/A;  . Esophagogastroduodenoscopy N/A 03/02/2015    Procedure:  ESOPHAGOGASTRODUODENOSCOPY (EGD);  Surgeon: Arta Silence, MD;  Location: [facility];  Service: Endoscopy;  Laterality: N/A;  bedside    Elmer Picker MS Dietetic Intern Pager Number [phone]

## 2015-03-03 NOTE — Evaluation (Addendum)
Clinical/Bedside Swallow Evaluation Patient Details  Name: Tiffany [name] MRN: [id number] Date of Birth: 1929-03-03  Today's Date: 03/03/2015 Time: SLP Start Time (ACUTE ONLY): 1255 SLP Stop Time (ACUTE ONLY): 1310 SLP Time Calculation (min) (ACUTE ONLY): 15 min  Past Medical History:  Past Medical History  Diagnosis Date  . Hypertension   . Arthritis   . Dementia    Past Surgical History:  Past Surgical History  Procedure Laterality Date  . Back surgery  09/25/2011  . Abdominal hysterectomy    . Esophagogastroduodenoscopy N/A 03/01/2015    Procedure: ESOPHAGOGASTRODUODENOSCOPY (EGD);  Surgeon: Arta Silence, MD;  Location: [facility];  Service: Endoscopy;  Laterality: N/A;  . Esophagogastroduodenoscopy N/A 03/02/2015    Procedure: ESOPHAGOGASTRODUODENOSCOPY (EGD);  Surgeon: Arta Silence, MD;  Location: [facility];  Service: Endoscopy;  Laterality: N/A;  bedside   HPI:  81 yo adm to [facility] with GI bleed.  Pt is s/p endoscopy and was placed on ventilator.  She is now extubated as of 930 am today and swallow evaluation was ordered.  Pt found to have right lobe infiltrate/layering right effusion per CXR.  Son present reports pt has had weight loss recently.     Assessment / Plan / Recommendation Clinical Impression  Pt currently presents with symptoms of difficulties protecting her airway with intake characterized by nearly aphonia and overt aspiration of water via tsp. Uncomfortable coughing noted immediately post swallow.  Pt also with viscous secretions in oral cavity without awareness- SLP cleared with oral suction and toothette.  Family reports pt with normal voice at baseline and therefore this SLP suspects edema from intubation/endoscopy negatively impacting swallow.  Hopeful for resolution to acute dysphagia within the next 24 hours.    Pt admits to premorbid dysphagia to solids = pointing to esophagus to indicate are of poor clearance.  She and family deny her requiring  heimlich manuever nor having recurrent pulmonary infections.    Advised pt/family to goal being for pt to be able to phonate and have strong cough.  Educated pt/family to provide pt with oral moisture via toothette but otherwise NPO and SLP will return next date to reeval for po readiness.  They verbalized understanding to information and spouse stated pt was to go home tomorrow.    Thanks for this consult.     Aspiration Risk  Severe    Diet Recommendation NPO (oral care/moistening with toothette)   Medication Administration: Via alternative means    Other  Recommendations Oral Care Recommendations: Oral care Q4 per protocol   Follow Up Recommendations   (TBD, anticipate none)    Frequency and Duration min 2x/week  1 week   Pertinent Vitals/Pain afebrile      Swallow Study Prior Functional Status   see Dixon Date of Onset: 03/03/15 HPI: 81 yo adm to [facility] with GI bleed.  Pt is s/p endoscopy and was placed on ventilator.  She is now extubated as of 930 am today and swallow evaluation was ordered.  Pt found to have right lobe infiltrate/layering right effusion per CXR.  Son present reports pt has had weight loss recently.   Type of Study: Bedside swallow evaluation Diet Prior to this Study: Thin liquids;Other (Comment) (clears) Temperature Spikes Noted: No Respiratory Status: Room air History of Recent Intubation: Yes Length of Intubations (days): 1 days Date extubated: 03/03/15 Behavior/Cognition: Alert;Other (comment) (delayed responses) Oral Cavity - Dentition: Adequate natural dentition;Dentures, top (top denture not in place at this time) Patient Positioning: Upright  in chair Baseline Vocal Quality: Aphonic;Low vocal intensity;Breathy (nearly aphonic) Volitional Cough: Weak Volitional Swallow: Able to elicit    Oral/Motor/Sensory Function Overall Oral Motor/Sensory Function: Appears within functional limits for tasks assessed (no focal cn deficits but  generalized weakness)   Ice Chips Ice chips: Not tested Other Comments: pt does not like ice chips therefore did not test them   Thin Liquid Thin Liquid: Impaired Presentation: Spoon Oral Phase Impairments: Reduced lingual movement/coordination Pharyngeal  Phase Impairments: Suspected delayed Swallow;Multiple swallows;Cough - Immediate Other Comments: immediate overt coughing/choking - concerning for airway edema prohibiting adequate airway protection with intake    Nectar Thick Nectar Thick Liquid: Not tested   Honey Thick Honey Thick Liquid: Not tested   Puree Puree: Not tested   Solid   GO    Solid: Not tested       Luanna Salk, [facility] SLP [phone]

## 2015-03-03 NOTE — Progress Notes (Signed)
PULMONARY / CRITICAL CARE MEDICINE   Name: Tiffany [name] MRN: [id number] DOB: 11-30-1929    ADMISSION DATE:  03/01/2015 CONSULTATION DATE:3/22  REFERRING MD : outlaw  CHIEF COMPLAINT:  Hematemesis   INITIAL PRESENTATION: Hematemesis   STUDIES:    SIGNIFICANT EVENTS: 3/22 intubated for egd   HISTORY OF PRESENT ILLNESS:  81 yo WM who presented to [facility] with new onset hematemesis and was taken to Endo suite for scope without source identification. She was intubated for the procedure and remains intubated. She is going to IR intubated and PCCM asked to manage vent and her car while intubated. She is not on anticoagulants as an outpatient. Currently HD stable and in route to IR.  SUBJECTIVE:  Arousable and weaning well on vent with minimal sedation.  VITAL SIGNS: Temp:  [98.2 F (36.8 C)-100 F (37.8 C)] 98.6 F (37 C) (03/24 0800) Pulse Rate:  [79-115] 81 (03/24 0923) Resp:  [16-24] 19 (03/24 0923) BP: (84-152)/(30-68) 116/46 mmHg (03/24 0923) SpO2:  [100 %] 100 % (03/24 0923) FiO2 (%):  [30 %] 30 % (03/24 0923) Weight:  [63.1 kg (139 lb 1.8 oz)] 63.1 kg (139 lb 1.8 oz) (03/24 0350)   HEMODYNAMICS:   VENTILATOR SETTINGS: Vent Mode:  [-] PSV FiO2 (%):  [30 %] 30 % Set Rate:  [16 bmp] 16 bmp Vt Set:  [420 mL] 420 mL PEEP:  [5 cmH20] 5 cmH20 Pressure Support:  [5 cmH20] 5 cmH20 Plateau Pressure:  [11 cmH20-15 cmH20] 13 cmH20   INTAKE / OUTPUT:  Intake/Output Summary (Last 24 hours) at 03/03/15 5498 Last data filed at 03/03/15 0900  Gross per 24 hour  Intake 2758.55 ml  Output    770 ml  Net 1988.55 ml   PHYSICAL EXAMINATION: General:  Elderly female sedated on vent, opens eyes Neuro: Sedated, but follows commands HEENT:  PERRL, EOM-I, no jvd Cardiovascular:  hsr rrr Lungs:  CTA B Abdomen:  Soft +bs, NGT dark red drainage Musculoskeletal:  intact Skin:  warm  LABS:  CBC  Recent Labs Lab 03/01/15 0958 03/01/15 1400  03/02/15 0300  03/02/15 2207  03/03/15 0330 03/03/15 0815  WBC 13.1* 12.0*  --  23.1*  --   --   --   --   HGB 8.8* 6.6*  < > 7.2*  < > 7.2* 7.6* 7.2*  HCT 28.2* 20.9*  < > 22.0*  < > 21.6* 22.6* 22.1*  PLT 160 120*  --  178  --   --   --   --   < > = values in this interval not displayed. Coag's  Recent Labs Lab 03/01/15 0958 03/01/15 1400 03/03/15 0330  APTT  --  30 30  INR 1.26 1.25 1.30   BMET  Recent Labs Lab 03/01/15 1400 03/02/15 0300 03/03/15 0330  NA 143 143 146*  K 4.8 3.7 3.8  CL 110 110 114*  CO2 26 25 25   BUN 55* 54* 48*  CREATININE 0.68 0.69 0.83  GLUCOSE 151* 146* 128*   Electrolytes  Recent Labs Lab 03/01/15 1400 03/02/15 0300 03/03/15 0330  CALCIUM 8.1* 8.2* 8.1*  MG  --  1.8 1.8  PHOS  --  3.9 3.0   Sepsis Markers No results for input(s): LATICACIDVEN, PROCALCITON, O2SATVEN in the last 168 hours.   ABG  Recent Labs Lab 03/01/15 1358 03/02/15 0730  PHART 7.338* 7.385  PCO2ART 47.4* 41.0  PO2ART 432.0* 80.4   Liver Enzymes  Recent Labs Lab 03/01/15 0958 03/01/15 1400  AST 20 21  ALT 19 16  ALKPHOS 92 72  BILITOT 0.6 0.6  ALBUMIN 3.5 3.0*   Cardiac Enzymes No results for input(s): TROPONINI, PROBNP in the last 168 hours. Glucose  Recent Labs Lab 03/02/15 1356  GLUCAP 104*   Imaging Dg Chest Port 1 View  03/02/2015   CLINICAL DATA:  Intubation.  EXAM: PORTABLE CHEST - 1 VIEW  COMPARISON:  03/01/2015.  FINDINGS: Endotracheal tube in stable position. Interim placement of NG tube, tip appears to be in the stomach. Mild right lower lobe infiltrate. Heart size pulmonary vascularity stable. No pleural effusion or pneumothorax.  IMPRESSION: 1. Interim placement of NG tube, its tip appears to be in the stomach. Endotracheal tube in stable position. 2. Interim appearance of right lower lobe infiltrate.   Electronically Signed   By: Marcello Moores  Register   On: 03/02/2015 07:26   ASSESSMENT / PLAN:  PULMONARY OETT 3/22 for Endo>> A: Intubated for procedures P:    Vent bundle Extubate today Titrate O2 for sat of 88-92%.  CARDIOVASCULAR CVL None A:  Htn Possible hypotension P:  HD monitoring Hold antihypertensives   RENAL Lab Results  Component Value Date   CREATININE 0.83 03/03/2015   CREATININE 0.69 03/02/2015   CREATININE 0.68 03/01/2015    A: Hypokalemia and hypernatremia P:   BMET in AM. Replace electrolytes as indicated.  GASTROINTESTINAL A:   GIB P:   Per GI Swallow evaluation Protonix BID.  HEMATOLOGIC  Recent Labs  03/03/15 0330 03/03/15 0815  HGB 7.6* 7.2*   A:   Blood loss P:  Transfuse for hgb of 8 H&H q6. Type and screen.  INFECTIOUS A: No acute issue P:   Monitor fever curve and WBC.  ENDOCRINE A:   No acute issue  P:   Monitor while NPO  NEUROLOGIC A: Intact prior to intubation P:   RASS goal-1 Sedated while on vent Propofol drip and fentanyl pushes as ordered.  FAMILY  - Updates: Family updated at bedside.  TODAY'S SUMMARY:  Extubate today, titrate O2 for sats, swallow evaluation for diet, will hold in the ICU overnight, likely transfer to tele in AM and to [facility] in AM  The patient is critically ill with multiple organ systems failure and requires high complexity decision making for assessment and support, frequent evaluation and titration of therapies, application of advanced monitoring technologies and extensive interpretation of multiple databases.   Critical Care Time devoted to patient care services described in this note is  35  Minutes. This time reflects time of care of this signee Dr Jennet Maduro. This critical care time does not reflect procedure time, or teaching time or supervisory time of PA/NP/Med student/Med Resident etc but could involve care discussion time.  Rush Farmer, M.D. [facility]. Pager: [phone]. After hours pager: [phone].

## 2015-03-03 NOTE — Procedures (Signed)
Extubation Procedure Note  Patient Details:   Name: Tiffany [name] DOB: 06-26-1929 MRN: [id number]   Airway Documentation:     Evaluation  O2 sats: stable throughout Complications: No apparent complications Patient did tolerate procedure well. Bilateral Breath Sounds: Clear, Diminished Suctioning: Oral, Airway Yes   Pt tolerated wean, positive for cuff leak, extubated to 3L [place]. No dyspnea or stridor noted after extubation. RT will continue to monitor.   Mariam Dollar 03/03/2015, 9:45 AM

## 2015-03-03 NOTE — Progress Notes (Signed)
Subjective: Intubated at time of my exam (~ 845). No hematemesis or blood in stool.  Objective: Vital signs in last 24 hours: Temp:  [98.2 F (36.8 C)-100 F (37.8 C)] 98.6 F (37 C) (03/24 0800) Pulse Rate:  [79-115] 86 (03/24 0944) Resp:  [16-24] 23 (03/24 0944) BP: (84-150)/(30-68) 116/46 mmHg (03/24 0944) SpO2:  [100 %] 100 % (03/24 0944) FiO2 (%):  [30 %] 30 % (03/24 0923) Weight:  [63.1 kg (139 lb 1.8 oz)] 63.1 kg (139 lb 1.8 oz) (03/24 0350) Weight change: 6.4 kg (14 lb 1.8 oz)   PE: GEN:  Intubated, sedated (at time of my exam, 0845) ABD:  Soft  Lab Results: CBC    Component Value Date/Time   WBC 23.1* 03/02/2015 0300   RBC 2.42* 03/02/2015 0300   HGB 7.2* 03/03/2015 0815   HCT 22.1* 03/03/2015 0815   PLT 178 03/02/2015 0300   MCV 90.9 03/02/2015 0300   MCH 29.8 03/02/2015 0300   MCHC 32.7 03/02/2015 0300   RDW 14.7 03/02/2015 0300   LYMPHSABS 1.3 03/01/2015 1400   MONOABS 0.6 03/01/2015 1400   EOSABS 0.0 03/01/2015 1400   BASOSABS 0.0 03/01/2015 1400   CMP     Component Value Date/Time   NA 146* 03/03/2015 0330   K 3.8 03/03/2015 0330   CL 114* 03/03/2015 0330   CO2 25 03/03/2015 0330   GLUCOSE 128* 03/03/2015 0330   BUN 48* 03/03/2015 0330   CREATININE 0.83 03/03/2015 0330   CALCIUM 8.1* 03/03/2015 0330   PROT 5.0* 03/01/2015 1400   ALBUMIN 3.0* 03/01/2015 1400   AST 21 03/01/2015 1400   ALT 16 03/01/2015 1400   ALKPHOS 72 03/01/2015 1400   BILITOT 0.6 03/01/2015 1400   GFRNONAA 63* 03/03/2015 0330   GFRAA 74* 03/03/2015 0330   Assessment:  1.  Hematemesis.  Endoscopy most supportive of Dieulafoy lesion in proximal stomach, over which two hemoclips were applied.  No evidence of recurrent bleeding. 2.  Acute blood loss anemia.  Any drop in Hgb over past 24 hours is likely reflective of equilibration changes rather than ongoing bleeding.  Plan:  1.  No ASA/NSAIDs for six weeks.  Could consider restarting her aspirin in about 6 weeks, if  cardiac (or other benefits) are felt to outweigh potential risks of rebleeding. 2.  Once extubated, can transition to oral pantoprazole (40 mg po bid x 6 weeks, then 40 mg po qd x 6 weeks then d/c if not on any NSAIDs, including ASA).  If, after 6 weeks, patient is felt to need ASA or NSAIDs, then would continue on pantoprazole 40 mg po qd indefinitely. 3.  OK to extubate, and once awake/alert, start clear liquid diet, from GI perspective. 4.  [facility] will follow; case discussed in detail with patient's family, who is at the bedside.   Landry Dyke 03/03/2015, 10:21 AM

## 2015-03-03 NOTE — Progress Notes (Signed)
[facility] ADULT ICU REPLACEMENT PROTOCOL FOR AM LAB REPLACEMENT ONLY  The patient does apply for the [facility] Adult ICU Electrolyte Replacment Protocol based on the criteria listed below:   1. Is GFR >/= 40 ml/min? Yes.    Patient's GFR today is 63 2. Is urine output >/= 0.5 ml/kg/hr for the last 6 hours? Yes.   Patient's UOP is .59 ml/kg/hr 3. Is BUN < 60 mg/dL? Yes.    Patient's BUN today is 48 4. Abnormal electrolyte(s): Mg 1.8 5. Ordered repletion with: per protocol 6. If a panic level lab has been reported, has the CCM MD in charge been notified? Yes.  .   Physician:  Beatriz Stallion 03/03/2015 5:13 AM

## 2015-03-04 LAB — BASIC METABOLIC PANEL
Anion gap: 10 (ref 5–15)
BUN: 35 mg/dL — AB (ref 6–23)
CALCIUM: 8.1 mg/dL — AB (ref 8.4–10.5)
CO2: 22 mmol/L (ref 19–32)
Chloride: 113 mmol/L — ABNORMAL HIGH (ref 96–112)
Creatinine, Ser: 0.74 mg/dL (ref 0.50–1.10)
GFR calc Af Amer: 89 mL/min — ABNORMAL LOW (ref >90)
GFR, EST NON AFRICAN AMERICAN: 77 mL/min — AB (ref >90)
Glucose, Bld: 118 mg/dL — ABNORMAL HIGH (ref 70–99)
POTASSIUM: 3.4 mmol/L — AB (ref 3.5–5.1)
Sodium: 145 mmol/L (ref 135–145)

## 2015-03-04 LAB — MAGNESIUM: Magnesium: 2.2 mg/dL (ref 1.5–2.5)

## 2015-03-04 LAB — URINALYSIS, ROUTINE W REFLEX MICROSCOPIC
Bilirubin Urine: NEGATIVE
GLUCOSE, UA: NEGATIVE mg/dL
HGB URINE DIPSTICK: NEGATIVE
Ketones, ur: 40 mg/dL — AB
Nitrite: POSITIVE — AB
Protein, ur: NEGATIVE mg/dL
Specific Gravity, Urine: 1.024 (ref 1.005–1.030)
Urobilinogen, UA: 1 mg/dL (ref 0.0–1.0)
pH: 5.5 (ref 5.0–8.0)

## 2015-03-04 LAB — CBC
HCT: 23.6 % — ABNORMAL LOW (ref 36.0–46.0)
HEMATOCRIT: 22.8 % — AB (ref 36.0–46.0)
HEMOGLOBIN: 7.4 g/dL — AB (ref 12.0–15.0)
Hemoglobin: 7.6 g/dL — ABNORMAL LOW (ref 12.0–15.0)
MCH: 30 pg (ref 26.0–34.0)
MCH: 29.7 pg (ref 26.0–34.0)
MCHC: 32.5 g/dL (ref 30.0–36.0)
MCHC: 32.2 g/dL (ref 30.0–36.0)
MCV: 92.3 fL (ref 78.0–100.0)
MCV: 92.2 fL (ref 78.0–100.0)
PLATELETS: 133 10*3/uL — AB (ref 150–400)
Platelets: 105 10*3/uL — ABNORMAL LOW (ref 150–400)
RBC: 2.47 MIL/uL — AB (ref 3.87–5.11)
RBC: 2.56 MIL/uL — ABNORMAL LOW (ref 3.87–5.11)
RDW: 16.3 % — ABNORMAL HIGH (ref 11.5–15.5)
RDW: 16.4 % — AB (ref 11.5–15.5)
WBC: 9.6 10*3/uL (ref 4.0–10.5)
WBC: 10.6 10*3/uL — AB (ref 4.0–10.5)

## 2015-03-04 LAB — PHOSPHORUS: Phosphorus: 2.4 mg/dL (ref 2.3–4.6)

## 2015-03-04 LAB — URINE MICROSCOPIC-ADD ON

## 2015-03-04 MED ORDER — POTASSIUM CHLORIDE CRYS ER 20 MEQ PO TBCR
20.0000 meq | EXTENDED_RELEASE_TABLET | Freq: Once | ORAL | Status: AC
  Administered 2015-03-04: 20 meq via ORAL
  Filled 2015-03-04: qty 1

## 2015-03-04 MED ORDER — POTASSIUM CHLORIDE CRYS ER 20 MEQ PO TBCR
40.0000 meq | EXTENDED_RELEASE_TABLET | Freq: Once | ORAL | Status: AC
  Administered 2015-03-04: 40 meq via ORAL
  Filled 2015-03-04: qty 2

## 2015-03-04 MED ORDER — SODIUM CHLORIDE 0.9 % IV SOLN
INTRAVENOUS | Status: DC
  Administered 2015-03-04 – 2015-03-05 (×2): via INTRAVENOUS

## 2015-03-04 MED ORDER — POTASSIUM CHLORIDE 10 MEQ/100ML IV SOLN
10.0000 meq | INTRAVENOUS | Status: DC
  Filled 2015-03-04 (×2): qty 100

## 2015-03-04 NOTE — Evaluation (Signed)
Physical Therapy Evaluation Patient Details Name: Tiffany [name] MRN: [id number] DOB: [birth year] Today's Date: 03/04/2015   History of Present Illness  Tiffany [name] is a 81 y.o. female presenting with acute onset hematemesis on 3/22.Enoscopy performed 03/02/15 for upper GI bleed. required ventilator until3/24.  Clinical Impression  Patient's HR 123, and sats difficult to pick,  95% on  4 l once returned to  Room and sitting on the edge of the bed. Patient will benefit from PT to address problems listed in note below.    Follow Up Recommendations SNF;Supervision -unless family available  24/7, spouse does not appear to be able to assist patient. Daughter states interested in  [facility].   Equipment Recommendations  Rolling walker with 5" wheels    Recommendations for Other Services       Precautions / Restrictions Precautions Precautions: Fall Precaution Comments: onitor sats      Mobility  Bed Mobility Overal bed mobility: Needs Assistance Bed Mobility: Supine to Sit;Sit to Supine     Supine to sit: Mod assist Sit to supine: Mod assist   General bed mobility comments: assist to  get  to upright and back onto bed.  Transfers Overall transfer level: Needs assistance Equipment used: Rolling walker (2 wheeled) Transfers: Sit to/from Stand Sit to Stand: Mod assist         General transfer comment: steady assist for rising from bed, patient is unsteady.  Ambulation/Gait Ambulation/Gait assistance: Mod assist;+2 physical assistance;+2 safety/equipment Ambulation Distance (Feet): 50 Feet Assistive device: Rolling walker (2 wheeled) Gait Pattern/deviations: Step-through pattern     General Gait Details: gait is very unsteady, noted  feet are constantly moving.  Stairs            Wheelchair Mobility    Modified Rankin (Stroke Patients Only)       Balance Overall balance assessment: History of Falls;Needs assistance Sitting-balance support: Feet  supported;Bilateral upper extremity supported Sitting balance-Leahy Scale: Fair                                       Pertinent Vitals/Pain Pain Assessment: No/denies pain    Home Living Family/patient expects to be discharged to:: Private residence   Available Help at Discharge: Family   Home Access: Stairs to enter Entrance Stairs-Rails: Psychiatric nurse of Steps: 2 Home Layout: One level Home Equipment: None      Prior Function Level of Independence: Independent               Hand Dominance        Extremity/Trunk Assessment   Upper Extremity Assessment: Generalized weakness           Lower Extremity Assessment: Generalized weakness;RLE deficits/detail RLE Deficits / Details: noted     Cervical / Trunk Assessment: Kyphotic  Communication   Communication: No difficulties  Cognition Arousal/Alertness: Awake/alert Behavior During Therapy: WFL for tasks assessed/performed Overall Cognitive Status: Within Functional Limits for tasks assessed                      General Comments      Exercises        Assessment/Plan    PT Assessment Patient needs continued PT services  PT Diagnosis Difficulty walking   PT Problem List Decreased strength;Decreased activity tolerance;Decreased mobility;Decreased cognition;Decreased knowledge of use of DME;Decreased safety awareness;Decreased knowledge of precautions;Cardiopulmonary status limiting activity  PT Treatment  Interventions DME instruction;Gait training;Stair training;Functional mobility training;Therapeutic activities;Patient/family education   PT Goals (Current goals can be found in the Care Plan section) Acute Rehab PT Goals Patient Stated Goal: to go home PT Goal Formulation: With patient/family Time For Goal Achievement: 03/18/15 Potential to Achieve Goals: Good    Frequency Min 3X/week   Barriers to discharge Decreased caregiver support       Co-evaluation               End of Session Equipment Utilized During Treatment: Oxygen Activity Tolerance: Patient limited by fatigue;Treatment limited secondary to medical complications (Comment) (HR to 123) Patient left: in bed;with call bell/phone within reach;with family/visitor present Nurse Communication: Mobility status         Time: 6301-6010 PT Time Calculation (min) (ACUTE ONLY): 15 min   Charges:   PT Evaluation $Initial PT Evaluation Tier I: 1 Procedure     PT G CodesClaretha Cooper 03/04/2015, 4:57 PM [name] PT [phone]

## 2015-03-04 NOTE — Progress Notes (Signed)
PULMONARY / CRITICAL CARE MEDICINE   Name: Tiffany [name] MRN: [id number] DOB: 1929/01/12    ADMISSION DATE:  03/01/2015 CONSULTATION DATE:3/22  REFERRING MD : outlaw  CHIEF COMPLAINT:  Hematemesis   INITIAL PRESENTATION: Hematemesis   STUDIES:    SIGNIFICANT EVENTS: 3/22 intubated for egd   HISTORY OF PRESENT ILLNESS:  81 yo WM who presented to [facility] with new onset hematemesis and was taken to Endo suite for scope without source identification. She was intubated for the procedure and remains intubated. She is going to IR intubated and PCCM asked to manage vent and her car while intubated. She is not on anticoagulants as an outpatient. Currently HD stable and in route to IR.  SUBJECTIVE:  Extubated successfully 3/24 Did not pass her initial swallowing eval 3/24, but much more awake now  VITAL SIGNS: Temp:  [97.8 F (36.6 C)-98.9 F (37.2 C)] 97.8 F (36.6 C) (03/25 0800) Pulse Rate:  [83-120] 101 (03/25 0700) Resp:  [16-30] 26 (03/25 0700) BP: (113-161)/(42-101) 157/69 mmHg (03/25 0700) SpO2:  [80 %-100 %] 92 % (03/25 0700)   HEMODYNAMICS:   VENTILATOR SETTINGS:     INTAKE / OUTPUT:  Intake/Output Summary (Last 24 hours) at 03/04/15 0954 Last data filed at 03/04/15 0600  Gross per 24 hour  Intake   1900 ml  Output    650 ml  Net   1250 ml   PHYSICAL EXAMINATION: General:  Elderly female comfortable up to chair Neuro: Awake, alert, confused, poorly oriented. Globally weak HEENT:  PERRL, EOM-I, no jvd Cardiovascular: regular, no M Lungs:  CTA B Abdomen:  Soft +bs Musculoskeletal:  intact Skin:  warm  LABS:  CBC  Recent Labs Lab 03/01/15 1400  03/02/15 0300  03/03/15 0815 03/03/15 2045 03/04/15 0230  WBC 12.0*  --  23.1*  --   --   --  9.6  HGB 6.6*  < > 7.2*  < > 7.2* 7.5* 7.4*  HCT 20.9*  < > 22.0*  < > 22.1* 23.1* 22.8*  PLT 120*  --  178  --   --   --  105*  < > = values in this interval not displayed. Coag's  Recent Labs Lab  03/01/15 0958 03/01/15 1400 03/03/15 0330  APTT  --  30 30  INR 1.26 1.25 1.30   BMET  Recent Labs Lab 03/02/15 0300 03/03/15 0330 03/04/15 0230  NA 143 146* 145  K 3.7 3.8 3.4*  CL 110 114* 113*  CO2 25 25 22   BUN 54* 48* 35*  CREATININE 0.69 0.83 0.74  GLUCOSE 146* 128* 118*   Electrolytes  Recent Labs Lab 03/02/15 0300 03/03/15 0330 03/04/15 0230  CALCIUM 8.2* 8.1* 8.1*  MG 1.8 1.8 2.2  PHOS 3.9 3.0 2.4   Sepsis Markers No results for input(s): LATICACIDVEN, PROCALCITON, O2SATVEN in the last 168 hours.   ABG  Recent Labs Lab 03/01/15 1358 03/02/15 0730  PHART 7.338* 7.385  PCO2ART 47.4* 41.0  PO2ART 432.0* 80.4   Liver Enzymes  Recent Labs Lab 03/01/15 0958 03/01/15 1400  AST 20 21  ALT 19 16  ALKPHOS 92 72  BILITOT 0.6 0.6  ALBUMIN 3.5 3.0*   Cardiac Enzymes No results for input(s): TROPONINI, PROBNP in the last 168 hours. Glucose  Recent Labs Lab 03/02/15 1356  GLUCAP 104*   Imaging Dg Chest Port 1 View  03/03/2015   CLINICAL DATA:  Hypoxia  EXAM: PORTABLE CHEST - 1 VIEW  COMPARISON:  March 02, 2015  FINDINGS: Endotracheal tube tip is 5.4 cm above the carina. No pneumothorax. There is a moderate effusion on the right which is layering. There is underlying emphysematous change. There is no edema or consolidation. Heart is upper normal in size with pulmonary vascularity within normal limits. No adenopathy. There old healed rib fractures on the right.  IMPRESSION: Endotracheal tube as described without pneumothorax. Layering right effusion. No edema or consolidation. No change in cardiac silhouette.   Electronically Signed   By: Lowella Grip III M.D.   On: 03/03/2015 07:14   ASSESSMENT / PLAN:  PULMONARY OETT 3/22 for Endo>> 3/24 A: Intubated for procedures P:   pulm hygiene.  CARDIOVASCULAR CVL None A:  Htn Relative hypotension in setting GIB P:  HD monitoring Hold antihypertensives for now, restart as BP  allows  RENAL Lab Results  Component Value Date   CREATININE 0.74 03/04/2015   CREATININE 0.83 03/03/2015   CREATININE 0.69 03/02/2015   A: Hypokalemia and hypernatremia P:   BMET in AM. Replace electrolytes as indicated. K ordered, will attempt to give PO. Will need IV access  GASTROINTESTINAL A:   GIB P:   Dieulafoy lesion noted, clipped on EGD Repeat swallow evaluation 3/25 Protonix BID.  HEMATOLOGIC  Recent Labs  03/03/15 2045 03/04/15 0230  HGB 7.5* 7.4*   A:   Blood loss P:  Transfuse for hgb of 8 H&H q12h  Type and screen.  INFECTIOUS A: No acute issue P:   Monitor fever curve and WBC.  ENDOCRINE A:   No acute issue  P:   Monitor CBG while NPO  NEUROLOGIC A: Intact prior to intubation; some confusion 3/25, ? Her baseline, ? Any component dementia P:   RASS goal 0 to 1 Careful with sedating meds, note she is on cymbalta at home  FAMILY  - Updates: Family updated at bedside 3/25  OK to transfer to telemetry bed 3/25, to [facility] as of 3/26   Baltazar Apo, MD, PhD 03/04/2015, 10:01 AM [place] [phone] or if no answer [phone]

## 2015-03-04 NOTE — Progress Notes (Signed)
Speech Language Pathology Treatment: Dysphagia  Patient Details Name: Tiffany [name] MRN: [id number] DOB: 23-Jan-1929 Today's Date: 03/04/2015 Time: 2831-5176 SLP Time Calculation (min) (ACUTE ONLY): 19 min  Assessment / Plan / Recommendation Clinical Impression  Pt's vocal quality and mentation appear much improved as compared to previous date, with family present in agreement. They report vocal quality is at baseline, although perhaps mildly softer. Pt consumed thin liquids, purees, and regular solids without overt signs of aspiration. Mastication is slow, likely due to missing dentition as pt preferred not to have top dentures in place (natural dentition on bottom), and family reports that she eats softer foods at home. Would recommend initiating Dys 3 diet and thin liquids with use of general aspiration and esophageal precautions.   HPI HPI: 81 yo adm to [facility] with GI bleed.  Pt is s/p endoscopy and was placed on ventilator.  She is now extubated as of 930 am today and swallow evaluation was ordered.  Pt found to have right lobe infiltrate/layering right effusion per CXR.  Son present reports pt has had weight loss recently.     Pertinent Vitals Pain Assessment: No/denies pain  SLP Plan  Goals updated    Recommendations Diet recommendations: Dysphagia 3 (mechanical soft);Thin liquid Liquids provided via: Cup;Straw Medication Administration: Whole meds with liquid Supervision: Patient able to self feed;Intermittent supervision to cue for compensatory strategies Compensations: Slow rate;Small sips/bites Postural Changes and/or Swallow Maneuvers: Seated upright 90 degrees;Upright 30-60 min after meal       Oral Care Recommendations: Oral care BID Follow up Recommendations: None Plan: Goals updated    [name], M.A. CCC-SLP [phone]  [name] 03/04/2015, 11:43 AM

## 2015-03-04 NOTE — Progress Notes (Signed)
[facility] Progress Note  Subjective: No stools or emesis, patient extubated yesterday for swallowing test today  Objective: Vital signs in last 24 hours: Temp:  [97.8 F (36.6 C)-98.9 F (37.2 C)] 97.8 F (36.6 C) (03/25 0800) Pulse Rate:  [83-120] 101 (03/25 0700) Resp:  [16-30] 26 (03/25 0700) BP: (113-161)/(42-101) 157/69 mmHg (03/25 0700) SpO2:  [80 %-100 %] 92 % (03/25 0700) Weight change:    PE: Up in chair abdomen soft  Lab Results: Results for orders placed or performed during the hospital encounter of 03/01/15 (from the past 24 hour(s))  Hemoglobin and hematocrit, blood     Status: Abnormal   Collection Time: 03/03/15  8:45 PM  Result Value Ref Range   Hemoglobin 7.5 (L) 12.0 - 15.0 g/dL   HCT 23.1 (L) 36.0 - 46.0 %  Magnesium in AM     Status: None   Collection Time: 03/04/15  2:30 AM  Result Value Ref Range   Magnesium 2.2 1.5 - 2.5 mg/dL  CBC     Status: Abnormal   Collection Time: 03/04/15  2:30 AM  Result Value Ref Range   WBC 9.6 4.0 - 10.5 K/uL   RBC 2.47 (L) 3.87 - 5.11 MIL/uL   Hemoglobin 7.4 (L) 12.0 - 15.0 g/dL   HCT 22.8 (L) 36.0 - 46.0 %   MCV 92.3 78.0 - 100.0 fL   MCH 30.0 26.0 - 34.0 pg   MCHC 32.5 30.0 - 36.0 g/dL   RDW 16.3 (H) 11.5 - 15.5 %   Platelets 105 (L) 150 - 400 K/uL  Basic metabolic panel     Status: Abnormal   Collection Time: 03/04/15  2:30 AM  Result Value Ref Range   Sodium 145 135 - 145 mmol/L   Potassium 3.4 (L) 3.5 - 5.1 mmol/L   Chloride 113 (H) 96 - 112 mmol/L   CO2 22 19 - 32 mmol/L   Glucose, Bld 118 (H) 70 - 99 mg/dL   BUN 35 (H) 6 - 23 mg/dL   Creatinine, Ser 0.74 0.50 - 1.10 mg/dL   Calcium 8.1 (L) 8.4 - 10.5 mg/dL   GFR calc non Af Amer 77 (L) >90 mL/min   GFR calc Af Amer 89 (L) >90 mL/min   Anion gap 10 5 - 15  Phosphorus     Status: None   Collection Time: 03/04/15  2:30 AM  Result Value Ref Range   Phosphorus 2.4 2.3 - 4.6 mg/dL    Studies/Results: Dg Chest Port 1 View  03/03/2015    CLINICAL DATA:  Hypoxia  EXAM: PORTABLE CHEST - 1 VIEW  COMPARISON:  March 02, 2015  FINDINGS: Endotracheal tube tip is 5.4 cm above the carina. No pneumothorax. There is a moderate effusion on the right which is layering. There is underlying emphysematous change. There is no edema or consolidation. Heart is upper normal in size with pulmonary vascularity within normal limits. No adenopathy. There old healed rib fractures on the right.  IMPRESSION: Endotracheal tube as described without pneumothorax. Layering right effusion. No edema or consolidation. No change in cardiac silhouette.   Electronically Signed   By: Lowella Grip III M.D.   On: 03/03/2015 07:14      Assessment: Upper GI bleeding probably secondary to Dieulafoy type lesion or ulcer in the proximal stomach, status post mechanical clipping  Plan: Continue proton pump inhibitor, continue to monitor stools and hemoglobin, advance diet as tolerated. Would keep on proton pump inhibitor for at least 6 weeks, and  definitely if she is going to be on aspirin or NSAIDs. We'll sign off for now. Please call if evidence of further bleeding    Hamna Asa C 03/04/2015, 9:47 AM

## 2015-03-04 NOTE — Progress Notes (Signed)
[facility] ADULT ICU REPLACEMENT PROTOCOL FOR AM LAB REPLACEMENT ONLY  The patient does apply for the [facility] Adult ICU Electrolyte Replacment Protocol based on the criteria listed below:   1. Is GFR >/= 40 ml/min? Yes.    Patient's GFR today is 77 2. Is urine output >/= 0.5 ml/kg/hr for the last 6 hours? No. Patient's UOP is .79 ml/kg/hr 3. Is BUN < 60 mg/dL? Yes.    Patient's BUN today is 35 4. Abnormal electrolyte  K 3.4 5. Ordered repletion with: per protocol 6. If a panic level lab has been reported, has the CCM MD in charge been notified? Yes.  .   Physician:  Zettie Pho 03/04/2015 5:38 AM

## 2015-03-05 ENCOUNTER — Inpatient Hospital Stay (HOSPITAL_COMMUNITY): Payer: Commercial Managed Care - HMO

## 2015-03-05 DIAGNOSIS — R400 Somnolence: Secondary | ICD-10-CM

## 2015-03-05 LAB — BASIC METABOLIC PANEL
ANION GAP: 8 (ref 5–15)
BUN: 28 mg/dL — AB (ref 6–23)
CALCIUM: 8.3 mg/dL — AB (ref 8.4–10.5)
CO2: 23 mmol/L (ref 19–32)
Chloride: 116 mmol/L — ABNORMAL HIGH (ref 96–112)
Creatinine, Ser: 0.57 mg/dL (ref 0.50–1.10)
GFR calc non Af Amer: 83 mL/min — ABNORMAL LOW (ref >90)
Glucose, Bld: 120 mg/dL — ABNORMAL HIGH (ref 70–99)
Potassium: 4.3 mmol/L (ref 3.5–5.1)
SODIUM: 147 mmol/L — AB (ref 135–145)

## 2015-03-05 LAB — TYPE AND SCREEN
ABO/RH(D): B NEG
ANTIBODY SCREEN: NEGATIVE
UNIT DIVISION: 0
Unit division: 0

## 2015-03-05 LAB — CBC
HEMATOCRIT: 22.7 % — AB (ref 36.0–46.0)
Hemoglobin: 7.2 g/dL — ABNORMAL LOW (ref 12.0–15.0)
MCH: 29.5 pg (ref 26.0–34.0)
MCHC: 31.7 g/dL (ref 30.0–36.0)
MCV: 93 fL (ref 78.0–100.0)
Platelets: 132 10*3/uL — ABNORMAL LOW (ref 150–400)
RBC: 2.44 MIL/uL — ABNORMAL LOW (ref 3.87–5.11)
RDW: 16.5 % — AB (ref 11.5–15.5)
WBC: 8.1 10*3/uL (ref 4.0–10.5)

## 2015-03-05 LAB — BLOOD GAS, ARTERIAL
ACID-BASE DEFICIT: 1.2 mmol/L (ref 0.0–2.0)
Bicarbonate: 22.7 mEq/L (ref 20.0–24.0)
DRAWN BY: 422461
Delivery systems: POSITIVE
Expiratory PAP: 5
FIO2: 0.7 %
Inspiratory PAP: 12
Mode: POSITIVE
O2 SAT: 95.6 %
Patient temperature: 97.8
TCO2: 21.6 mmol/L (ref 0–100)
pCO2 arterial: 35.6 mmHg (ref 35.0–45.0)
pH, Arterial: 7.418 (ref 7.350–7.450)
pO2, Arterial: 79.6 mmHg — ABNORMAL LOW (ref 80.0–100.0)

## 2015-03-05 LAB — URINALYSIS, ROUTINE W REFLEX MICROSCOPIC
Bilirubin Urine: NEGATIVE
GLUCOSE, UA: NEGATIVE mg/dL
Hgb urine dipstick: NEGATIVE
KETONES UR: 15 mg/dL — AB
Leukocytes, UA: NEGATIVE
Nitrite: POSITIVE — AB
Protein, ur: NEGATIVE mg/dL
Specific Gravity, Urine: 1.023 (ref 1.005–1.030)
Urobilinogen, UA: 1 mg/dL (ref 0.0–1.0)
pH: 5.5 (ref 5.0–8.0)

## 2015-03-05 LAB — PREPARE RBC (CROSSMATCH)

## 2015-03-05 LAB — URINE MICROSCOPIC-ADD ON

## 2015-03-05 MED ORDER — CIPROFLOXACIN IN D5W 200 MG/100ML IV SOLN
200.0000 mg | Freq: Two times a day (BID) | INTRAVENOUS | Status: AC
  Administered 2015-03-05 – 2015-03-08 (×7): 200 mg via INTRAVENOUS
  Filled 2015-03-05 (×9): qty 100

## 2015-03-05 MED ORDER — FUROSEMIDE 10 MG/ML IJ SOLN
40.0000 mg | Freq: Once | INTRAMUSCULAR | Status: AC
  Administered 2015-03-06: 40 mg via INTRAVENOUS
  Filled 2015-03-05: qty 4

## 2015-03-05 MED ORDER — DEXTROSE 5 % IV SOLN
INTRAVENOUS | Status: DC
  Administered 2015-03-05 – 2015-03-09 (×2): via INTRAVENOUS
  Filled 2015-03-05: qty 1000

## 2015-03-05 MED ORDER — SODIUM CHLORIDE 0.9 % IV SOLN: Freq: Once | INTRAVENOUS | Status: DC

## 2015-03-05 NOTE — Clinical Social Work Placement (Signed)
[place] WORK PLACEMENT NOTE 03/05/2015  Patient:  Tiffany [name], Tiffany [name]  Account Number:  [id number] Admit date:  03/01/2015  Clinical Social Worker:  Dede Query, CLINICAL SOCIAL WORKER  Date/time:  03/05/2015 04:18 PM  Clinical Social Work is seeking post-discharge placement for this patient at the following level of care:   SKILLED NURSING   (*CSW will update this form in Epic as items are completed)   03/05/2015  Patient/family provided with [place] Department of Clinical Social Work's list of facilities offering this level of care within the geographic area requested by the patient (or if unable, by the patient's family).  03/05/2015  Patient/family informed of their freedom to choose among providers that offer the needed level of care, that participate in Medicare, Medicaid or managed care program needed by the patient, have an available bed and are willing to accept the patient.  03/05/2015  Patient/family informed of [facility]' ownership interest in [facility], as well as of the fact that they are under no obligation to receive care at this facility.  PASARR submitted to EDS on 03/05/2015 PASARR number received on 03/05/2015  FL2 transmitted to all facilities in geographic area requested by pt/family on  03/05/2015 FL2 transmitted to all facilities within larger geographic area on   Patient informed that his/her managed care company has contracts with or will negotiate with  certain facilities, including the following:     Patient/family informed of bed offers received:   Patient chooses bed at  Physician recommends and patient chooses bed at    Patient to be transferred to  on   Patient to be transferred to facility by  Patient and family notified of transfer on  Name of family member notified:    The following physician request were entered in Epic:   Additional Comments:  Dede Query, [place] Social Worker - Weekend Coverage cell #: [phone]

## 2015-03-05 NOTE — Progress Notes (Signed)
Patient family states that patient is acting completely different then she was yesterday. Her baseline she is able to asnwer questions. She is answering some questions. Will page MD

## 2015-03-05 NOTE — Progress Notes (Signed)
Patients family very upset because a physician has not been by to see them today. I paged Critical Care and looked at the physicians note from yesterday and care looked as if it was supposed to be transferred to to [facility] hospitalist this morning. Family has been at bedside all day and has not seen anyone. I called flow manager to try and find the attending physician and have not found anyone. Called ICU and spoke with Baxter Flattery (4th floor nurse) and she spoke with Colletta Maryland (ICU charge nurse) and let me know that critical care would still be seeing this patient today and [facility] hospitalist will take over tomorrow. Dr. Gwenette Greet will be at bedside to see family and patient by 3pm. Family updated.   Family concerned about chance in baseline mental status. Pt family are also interested in starting antibiotics because they were told she may have an a UTI. I will continue to monitor the patient and changes.

## 2015-03-05 NOTE — Progress Notes (Signed)
PULMONARY / CRITICAL CARE MEDICINE   Name: Tiffany [name] MRN: [id number] DOB: 03-15-1929    ADMISSION DATE:  03/01/2015 CONSULTATION DATE:3/22  REFERRING MD : outlaw  CHIEF COMPLAINT:  Hematemesis   INITIAL PRESENTATION: Hematemesis   STUDIES:    SIGNIFICANT EVENTS: 3/22 intubated for egd   HISTORY OF PRESENT ILLNESS:  81 yo WM who presented to [facility] with new onset hematemesis and was taken to Endo suite for scope without source identification. She was intubated for the procedure and remains intubated. She is going to IR intubated and PCCM asked to manage vent and her car while intubated. She is not on anticoagulants as an outpatient. Currently HD stable and in route to IR.  SUBJECTIVE:  Family is concerned because of a change in pt's mentation compared to yesterday.  No further vomiting or obvious GI bleed.  VITAL SIGNS: Temp:  [97.7 F (36.5 C)-98 F (36.7 C)] 97.8 F (36.6 C) (03/26 1401) Pulse Rate:  [50-105] 90 (03/26 1401) Resp:  [20-22] 20 (03/26 1401) BP: (127-139)/(56-81) 139/81 mmHg (03/26 1401) SpO2:  [91 %-93 %] 91 % (03/26 1401) Weight:  [64.4 kg (141 lb 15.6 oz)] 64.4 kg (141 lb 15.6 oz) (03/26 0517)   HEMODYNAMICS:   VENTILATOR SETTINGS:     INTAKE / OUTPUT:  Intake/Output Summary (Last 24 hours) at 03/05/15 1622 Last data filed at 03/05/15 0600  Gross per 24 hour  Intake    990 ml  Output    300 ml  Net    690 ml   PHYSICAL EXAMINATION: General:  Elderly female in NAD.  No increased wob Neuro: eyes closed but opens to commands,  moves all 4 to command.  RUE weaker than left, and LLE weaker than right. CN intact HEENT:  Nose without purulence or d/c, neck without LN or TMG Cardiovascular: mildly tachycardic, regular, no murmur Lungs:  +upper airway noise, a few rhonchi Abdomen:  Soft +bs, nontender LE with minimal edema, no cyanosis LABS:  CBC  Recent Labs Lab 03/04/15 0230 03/04/15 1635 03/05/15 0550  WBC 9.6 10.6* 8.1  HGB 7.4*  7.6* 7.2*  HCT 22.8* 23.6* 22.7*  PLT 105* 133* 132*   Coag's  Recent Labs Lab 03/01/15 0958 03/01/15 1400 03/03/15 0330  APTT  --  30 30  INR 1.26 1.25 1.30   BMET  Recent Labs Lab 03/03/15 0330 03/04/15 0230 03/05/15 0550  NA 146* 145 147*  K 3.8 3.4* 4.3  CL 114* 113* 116*  CO2 25 22 23   BUN 48* 35* 28*  CREATININE 0.83 0.74 0.57  GLUCOSE 128* 118* 120*    ASSESSMENT / PLAN:  PULMONARY OETT 3/22 for Endo>> 3/24 A: Intubated for procedures P:   Patient appears stable from pulmonary standpoint, but will monitor closely in light of upper airway noise and some decreased level of mentation.   GASTROINTESTINAL A:   GIB P:   Dieulafoy lesion noted, clipped on EGD Repeat swallow evaluation 3/25 >> dys 3 per speech Protonix BID. Will transfuse one unit given Hgb and alteration in mentation.   NEUROLOGIC A: Intact prior to intubation; some confusion 3/25, ? Her baseline, ? Any component dementia Family feels today there has been a dramatic change in her mentation compared to yesterday.  She follows commands today, but is sleepy and does not interact.  Possible contributors:  Getting fentanyl, increased sodium, anemia, possible UTI.  Will address each of these issues.  No direct evidence for a possible stroke, but will need to  keep in mind going forward.   P:   -treat for possible UTI with abnormal U/A, culture pending -transfuse one unit -d/c fentanyl -d/c IV saline and change to D5W for now.  Monitor bmet.  -transfer to stepdown bed.  The family is very concerned about her change in mentation and wishes her to be monitored closer.  Low threshold for ct head if mentation worsens.

## 2015-03-05 NOTE — Progress Notes (Signed)
Pt placed on continuous BiPAP for decreasing SpO2.  Pt placed on IPAP of 12 cm H2O and EPAP of 5 cm H2O. FiO2 is 70%.  Pt is stable getting appropriate tidal volumes and maintaining a good SpO2.

## 2015-03-05 NOTE — Progress Notes (Signed)
Called report to ICU/Stepdown and spoke to Amy. Patient transferred by nurse tech and student.

## 2015-03-05 NOTE — Progress Notes (Addendum)
[place] Progress Note Patient Name: Tiffany [name] DOB: 05-30-1929 MRN: [id number]   Date of Service  03/05/2015  HPI/Events of Note  Sats decreased and RR increased. Now on 100% NRBM. CXR this morning with R atelectasis and question of pulmonary congestion.  eICU Interventions  Will order: 1. BiPAP - IPAP 10 and EPAP 5. Respiratory to titrate IPAP, EPAP and FiO2.  2. ABG at 11 PM.  3. Lasix 40 mg IVP during PRBC infusion.     Intervention Category Major Interventions: Respiratory failure - evaluation and management Intermediate Interventions: Respiratory distress - evaluation and management  Sommer,Steven Eugene 03/05/2015, 9:49 PM

## 2015-03-05 NOTE — Clinical Social Work Psychosocial (Signed)
Clinical Social Work Department BRIEF PSYCHOSOCIAL ASSESSMENT 03/05/2015  Patient:  Hlavacek,Rasheedah H     Account Number:  [id number]     Admit date:  03/01/2015  Clinical Social Worker:  ,, CLINICAL SOCIAL WORKER  Date/Time:  03/05/2015 04:05 PM  Referred by:  Physician  Date Referred:  03/05/2015 Referred for  SNF Placement   Other Referral:   and   Interview type:  Patient Other interview type:   And spouse and daughter Nicki and son in law    PSYCHOSOCIAL DATA Living Status:  HUSBAND Admitted from facility:   Level of care:   Primary support name:  Charles Primary support relationship to patient:  SPOUSE Degree of support available:   High emotional support, low physical support husband is 89 in a wheelchair and pt cared for him before being hospitalized    CURRENT CONCERNS  Other Concerns:    SOCIAL WORK ASSESSMENT / PLAN CSW met with pt and family at bedside. CSW introduced herself and explained role.  CSW prompted pt's family to discuss history and needs.  CSW provided information about SNF process and rehab facilities list.  CSW provided supportive listening skills.   Assessment/plan status:  Psychosocial Support/Ongoing Assessment of Needs Other assessment/ plan:   Information/referral to community resources:   Send pt information to SNF's in [place]    PATIENT'S/FAMILY'S RESPONSE TO PLAN OF CARE: Pt's family stated that pt has had mild dementia since yesterday.  Pt's family stated that pt has a UTI that will be treated in hospital and they are hoping this will help with pt's dementia.  Pt's family discussed pt living with her 98 year old husband before hospitalization and taking care of him.  Pt's family stated that pt will need a rehab bed before going back home.  Pt's first choice for rehab is [facility], 2nd choice is [facility].  Pt's family grateful for CSW assistance.    , LCSW [facility] Clinical Social  Worker - Weekend Coverage cell #: [phone]     

## 2015-03-06 ENCOUNTER — Inpatient Hospital Stay (HOSPITAL_COMMUNITY): Payer: Commercial Managed Care - HMO

## 2015-03-06 DIAGNOSIS — I4891 Unspecified atrial fibrillation: Secondary | ICD-10-CM | POA: Diagnosis present

## 2015-03-06 LAB — BASIC METABOLIC PANEL
Anion gap: 11 (ref 5–15)
BUN: 21 mg/dL (ref 6–23)
CALCIUM: 8.4 mg/dL (ref 8.4–10.5)
CO2: 24 mmol/L (ref 19–32)
Chloride: 107 mmol/L (ref 96–112)
Creatinine, Ser: 0.63 mg/dL (ref 0.50–1.10)
GFR calc non Af Amer: 81 mL/min — ABNORMAL LOW (ref >90)
Glucose, Bld: 122 mg/dL — ABNORMAL HIGH (ref 70–99)
Potassium: 3.5 mmol/L (ref 3.5–5.1)
Sodium: 142 mmol/L (ref 135–145)

## 2015-03-06 LAB — CBC
HCT: 30.3 % — ABNORMAL LOW (ref 36.0–46.0)
Hemoglobin: 9.9 g/dL — ABNORMAL LOW (ref 12.0–15.0)
MCH: 29.6 pg (ref 26.0–34.0)
MCHC: 32.7 g/dL (ref 30.0–36.0)
MCV: 90.4 fL (ref 78.0–100.0)
PLATELETS: 126 10*3/uL — AB (ref 150–400)
RBC: 3.35 MIL/uL — ABNORMAL LOW (ref 3.87–5.11)
RDW: 15.6 % — ABNORMAL HIGH (ref 11.5–15.5)
WBC: 10.3 10*3/uL (ref 4.0–10.5)

## 2015-03-06 LAB — MAGNESIUM: Magnesium: 1.7 mg/dL (ref 1.5–2.5)

## 2015-03-06 MED ORDER — POTASSIUM CHLORIDE CRYS ER 20 MEQ PO TBCR: 20.0000 meq | EXTENDED_RELEASE_TABLET | ORAL | Status: DC

## 2015-03-06 MED ORDER — METOPROLOL TARTRATE 1 MG/ML IV SOLN
5.0000 mg | Freq: Once | INTRAVENOUS | Status: AC
  Administered 2015-03-06: 5 mg via INTRAVENOUS

## 2015-03-06 MED ORDER — AMIODARONE HCL IN DEXTROSE 360-4.14 MG/200ML-% IV SOLN
30.0000 mg/h | INTRAVENOUS | Status: DC
  Administered 2015-03-06 (×2): 30 mg/h via INTRAVENOUS
  Filled 2015-03-06: qty 200

## 2015-03-06 MED ORDER — MAGNESIUM SULFATE IN D5W 10-5 MG/ML-% IV SOLN
1.0000 g | Freq: Once | INTRAVENOUS | Status: AC
  Administered 2015-03-06: 1 g via INTRAVENOUS
  Filled 2015-03-06: qty 100

## 2015-03-06 MED ORDER — POTASSIUM CHLORIDE 10 MEQ/100ML IV SOLN: 10.0000 meq | INTRAVENOUS | Status: DC

## 2015-03-06 MED ORDER — DILTIAZEM LOAD VIA INFUSION
10.0000 mg | Freq: Once | INTRAVENOUS | Status: AC
  Administered 2015-03-06: 10 mg via INTRAVENOUS
  Filled 2015-03-06: qty 10

## 2015-03-06 MED ORDER — METOPROLOL TARTRATE 1 MG/ML IV SOLN
INTRAVENOUS | Status: AC
  Filled 2015-03-06: qty 5

## 2015-03-06 MED ORDER — DEXTROSE 5 % IV SOLN
5.0000 mg/h | INTRAVENOUS | Status: DC
  Administered 2015-03-06: 15 mg/h via INTRAVENOUS
  Administered 2015-03-06: 5 mg/h via INTRAVENOUS
  Filled 2015-03-06: qty 100

## 2015-03-06 MED ORDER — AMIODARONE HCL IN DEXTROSE 360-4.14 MG/200ML-% IV SOLN
60.0000 mg/h | INTRAVENOUS | Status: DC
  Administered 2015-03-06: 60 mg/h via INTRAVENOUS
  Filled 2015-03-06: qty 200

## 2015-03-06 MED ORDER — METOPROLOL TARTRATE 25 MG PO TABS
25.0000 mg | ORAL_TABLET | Freq: Two times a day (BID) | ORAL | Status: DC
  Administered 2015-03-06 – 2015-03-07 (×2): 25 mg via ORAL
  Filled 2015-03-06 (×2): qty 1

## 2015-03-06 MED ORDER — CETYLPYRIDINIUM CHLORIDE 0.05 % MT LIQD
7.0000 mL | Freq: Two times a day (BID) | OROMUCOSAL | Status: DC
  Administered 2015-03-06 – 2015-03-07 (×3): 7 mL via OROMUCOSAL

## 2015-03-06 MED ORDER — DILTIAZEM HCL 25 MG/5ML IV SOLN
10.0000 mg | Freq: Once | INTRAVENOUS | Status: AC
  Administered 2015-03-06: 10 mg via INTRAVENOUS

## 2015-03-06 MED ORDER — AMIODARONE IV BOLUS ONLY 150 MG/100ML
INTRAVENOUS | Status: AC
  Administered 2015-03-06: 150 mg
  Filled 2015-03-06: qty 100

## 2015-03-06 MED ORDER — POTASSIUM CHLORIDE CRYS ER 20 MEQ PO TBCR
40.0000 meq | EXTENDED_RELEASE_TABLET | Freq: Once | ORAL | Status: AC
  Administered 2015-03-06: 40 meq via ORAL
  Filled 2015-03-06: qty 2

## 2015-03-06 MED ORDER — FUROSEMIDE 10 MG/ML IJ SOLN
INTRAMUSCULAR | Status: AC
  Filled 2015-03-06: qty 4

## 2015-03-06 MED ORDER — AMIODARONE LOAD VIA INFUSION
150.0000 mg | Freq: Once | INTRAVENOUS | Status: DC
  Filled 2015-03-06: qty 83.34

## 2015-03-06 MED ORDER — HALOPERIDOL LACTATE 5 MG/ML IJ SOLN
2.0000 mg | Freq: Once | INTRAMUSCULAR | Status: AC
  Administered 2015-03-06: 2 mg via INTRAVENOUS

## 2015-03-06 MED ORDER — LIP MEDEX EX OINT
TOPICAL_OINTMENT | CUTANEOUS | Status: AC
  Administered 2015-03-06: 11:00:00
  Filled 2015-03-06: qty 7

## 2015-03-06 MED ORDER — PANTOPRAZOLE SODIUM 40 MG PO TBEC
40.0000 mg | DELAYED_RELEASE_TABLET | Freq: Two times a day (BID) | ORAL | Status: DC
  Administered 2015-03-06 – 2015-03-12 (×12): 40 mg via ORAL
  Filled 2015-03-06 (×13): qty 1

## 2015-03-06 MED ORDER — LIP MEDEX EX OINT: TOPICAL_OINTMENT | CUTANEOUS | Status: DC | PRN

## 2015-03-06 MED ORDER — FUROSEMIDE 10 MG/ML IJ SOLN
40.0000 mg | Freq: Once | INTRAMUSCULAR | Status: AC
  Administered 2015-03-06: 40 mg via INTRAVENOUS

## 2015-03-06 MED ORDER — AMIODARONE HCL IN DEXTROSE 360-4.14 MG/200ML-% IV SOLN
INTRAVENOUS | Status: AC
  Filled 2015-03-06: qty 200

## 2015-03-06 MED ORDER — HALOPERIDOL LACTATE 5 MG/ML IJ SOLN
INTRAMUSCULAR | Status: AC
  Filled 2015-03-06: qty 1

## 2015-03-06 NOTE — Progress Notes (Signed)
[place] Progress Note Patient Name: Tiffany [name] DOB: 02-15-1929 MRN: [id number]   Date of Service  03/06/2015  HPI/Events of Note  Delirium. Pulling off oxygen mask. QTc interval = 0.46 seconds.   eICU Interventions  Haldol 2 mg IV X 1 only.      Intervention Category Major Interventions: Delirium, psychosis, severe agitation - evaluation and management Minor Interventions: Communication with other healthcare providers and/or family  Lysle Dingwall 03/06/2015, 9:00 PM

## 2015-03-06 NOTE — Progress Notes (Signed)
Pt HR jumped up to 180s, MD notified. Changed Cardizem drip rate to 20mL/hr. Will continue to monitor.

## 2015-03-06 NOTE — Progress Notes (Signed)
  Amiodarone Drug - Drug Interaction Consult Note  Recommendations: Chose an alternate antibiotic for treatment of UTI  Amiodarone is metabolized by the cytochrome P450 system and therefore has the potential to cause many drug interactions. Amiodarone has an average plasma half-life of 50 days (range 20 to 100 days).   There is potential for drug interactions to occur several weeks or months after stopping treatment and the onset of drug interactions may be slow after initiating amiodarone.   []  Statins: Increased risk of myopathy. Simvastatin- restrict dose to 20mg  daily. Other statins: counsel patients to report any muscle pain or weakness immediately.  []  Anticoagulants: Amiodarone can increase anticoagulant effect. Consider warfarin dose reduction. Patients should be monitored closely and the dose of anticoagulant altered accordingly, remembering that amiodarone levels take several weeks to stabilize.  []  Antiepileptics: Amiodarone can increase plasma concentration of phenytoin, the dose should be reduced. Note that small changes in phenytoin dose can result in large changes in levels. Monitor patient and counsel on signs of toxicity.  []  Beta blockers: increased risk of bradycardia, AV block and myocardial depression. Sotalol - avoid concomitant use.  []   Calcium channel blockers (diltiazem and verapamil): increased risk of bradycardia, AV block and myocardial depression.  []   Cyclosporine: Amiodarone increases levels of cyclosporine. Reduced dose of cyclosporine is recommended.  []  Digoxin dose should be halved when amiodarone is started.  []  Diuretics: increased risk of cardiotoxicity if hypokalemia occurs.  []  Oral hypoglycemic agents (glyburide, glipizide, glimepiride): increased risk of hypoglycemia. Patient's glucose levels should be monitored closely when initiating amiodarone therapy.   [x]  Drugs that prolong the QT interval:  Torsades de pointes risk may be increased with  concurrent use - avoid if possible.  Monitor QTc, also keep magnesium/potassium WNL if concurrent therapy can't be avoided. Marland Kitchen Antibiotics: e.g. fluoroquinolones, erythromycin. . Antiarrhythmics: e.g. quinidine, procainamide, disopyramide, sotalol. . Antipsychotics: e.g. phenothiazines, haloperidol.  . Lithium, tricyclic antidepressants, and methadone.  Thank You,  Emiliano Dyer  03/06/2015 7:17 AM

## 2015-03-06 NOTE — Progress Notes (Signed)
PULMONARY / CRITICAL CARE MEDICINE   Name: Tiffany [name] MRN: [id number] DOB: [birth year]    ADMISSION DATE:  03/01/2015  REFERRING MD : outlaw  CHIEF COMPLAINT:  Hematemesis   INITIAL PRESENTATION: 81 yo female presented with hematemesis.  She required intubation during endoscopy.  STUDIES:  3/23 EGD >> Dieulafoy lesion proximal stomach s/p hemoclips  SIGNIFICANT EVENTS: 3/22 Admit, intubated 3/24 Extubated 3/25 Speech assessment >> D3 diet 3/26 To ICU due to change in mental status 3/27 Refractory a fib, PRBC 1 unit for Hb 7.2, cardiology consulted  SUBJECTIVE:  Events of last night noted.  VITAL SIGNS: Temp:  [97.4 F (36.3 C)-98 F (36.7 C)] 98 F (36.7 C) (03/27 0446) Pulse Rate:  [52-156] 90 (03/27 0500) Resp:  [20-28] 23 (03/27 0700) BP: (103-174)/(56-97) 106/66 mmHg (03/27 0700) SpO2:  [78 %-100 %] 93 % (03/27 0615) Weight:  [143 lb 15.4 oz (65.3 kg)] 143 lb 15.4 oz (65.3 kg) (03/27 0446)   INTAKE / OUTPUT:  Intake/Output Summary (Last 24 hours) at 03/06/15 0739 Last data filed at 03/06/15 0700  Gross per 24 hour  Intake   1009 ml  Output   1350 ml  Net   -341 ml   PHYSICAL EXAMINATION: General: ill appearing Neuro: awake, follows simple commands HEENT: BiPAP mask on Cardiovascular: irregular, tachycardic Lungs: b/l crackles Abdomen:  Soft, non tender Musculoskeletal: no edema Skin: no rashes  LABS:  CBC  Recent Labs Lab 03/04/15 1635 03/05/15 0550 03/06/15 0340  WBC 10.6* 8.1 10.3  HGB 7.6* 7.2* 9.9*  HCT 23.6* 22.7* 30.3*  PLT 133* 132* 126*   Coag's  Recent Labs Lab 03/01/15 0958 03/01/15 1400 03/03/15 0330  APTT  --  30 30  INR 1.26 1.25 1.30   BMET  Recent Labs Lab 03/04/15 0230 03/05/15 0550 03/06/15 0340  NA 145 147* 142  K 3.4* 4.3 3.5  CL 113* 116* 107  CO2 22 23 24   BUN 35* 28* 21  CREATININE 0.74 0.57 0.63  GLUCOSE 118* 120* 122*   Electrolytes  Recent Labs Lab 03/02/15 0300 03/03/15 0330  03/04/15 0230 03/05/15 0550 03/06/15 0340  CALCIUM 8.2* 8.1* 8.1* 8.3* 8.4  MG 1.8 1.8 2.2  --  1.7  PHOS 3.9 3.0 2.4  --   --    ABG  Recent Labs Lab 03/01/15 1358 03/02/15 0730 03/05/15 2334  PHART 7.338* 7.385 7.418  PCO2ART 47.4* 41.0 35.6  PO2ART 432.0* 80.4 79.6*   Liver Enzymes  Recent Labs Lab 03/01/15 0958 03/01/15 1400  AST 20 21  ALT 19 16  ALKPHOS 92 72  BILITOT 0.6 0.6  ALBUMIN 3.5 3.0*   Cardiac Enzymes No results for input(s): TROPONINI, PROBNP in the last 168 hours.   Glucose  Recent Labs Lab 03/02/15 1356  GLUCAP 104*   Imaging Dg Chest Port 1 View  03/05/2015   CLINICAL DATA:  Mental status change, shortness of breath. Low O2 sats.  EXAM: PORTABLE CHEST - 1 VIEW  COMPARISON:  03/03/2015  FINDINGS: There are bilateral pleural effusions, right larger than left, both enlarging since prior study. Effusion on the right is moderate to large. Worsening aeration in the right lung, likely atelectasis. There is also interstitial prominence throughout the lungs which could reflect edema. Heart is mildly enlarged.  IMPRESSION: Worsening bilateral effusions and bilateral airspace disease, both right greater than left. Airspace disease likely reflects a combination of edema and right side atelectasis.   Electronically Signed   By: Rolm Baptise M.D.  On: 03/05/2015 17:19   ASSESSMENT / PLAN:  PULMONARY ETT 3/22 for Endo>> 3/24 A: Acute respiratory failure from pulmonary edema. P:   Oxygen to keep SpO2 > 92% PRN BiPAP F/u CXR Diurese as tolerate  CARDIOVASCULAR CVL None A:  New onset A fib with RVR 3/27 >> converted to sinus after dose of lopressor 3/27. Hx of HTN. P:  Lopressor, amiodarone per cardiology No anticoagulation in setting of GI bleed No ASA for 6 weeks after EGD  RENAL A: Hypokalemia. Hypomagnesemia. P:   Goal K > 4, goal Mg > 2  GASTROINTESTINAL A:   Upper GI bleeding 2nd to Dieulafoy lesion. Dysphagia. P:   D3  diet Continue Protonix  Will need f/u with GI as outpt  HEMATOLOGIC A:   Acute blood loss anemia 2nd to GI bleed. Thrombocytopenia from consumption related to bleeding. P:  F/u CBC Transfuse for Hb < 7 or bleeding SCD's for DVT prevention  INFECTIOUS A:  UTI. P:   Day 2 Cipro  Urine 3/26 >>   ENDOCRINE A:   No acute issue. P:   Monitor blood sugar on BMET  NEUROLOGIC A: Hx of dementia. P:   Monitor mental status  CC time 40 minutes.  Chesley Mires, MD [facility] Pulmonary/Critical Care 03/06/2015, 7:56 AM Pager:  [phone] After 3pm call: [phone]

## 2015-03-06 NOTE — Progress Notes (Signed)
  Echocardiogram 2D Echocardiogram has been performed.  Tiffany [name] 03/06/2015, 10:42 AM

## 2015-03-06 NOTE — Progress Notes (Signed)
Attepmted to change patient from 100% NRB to 50% ventimask.  Patient desated into the 80"s on the ventimask.  Placed patient back on 100% NRB.  O2 sats improved.  Continue to monitor patient closely. Jullian Clayson Roselie Awkward RN

## 2015-03-06 NOTE — Progress Notes (Signed)
[facility] ADULT ICU REPLACEMENT PROTOCOL FOR AM LAB REPLACEMENT ONLY  The patient does apply for the [facility] Adult ICU Electrolyte Replacment Protocol based on the criteria listed below:   1. Is GFR >/= 40 ml/min? Yes.    Patient's GFR today is >90 2. Is urine output >/= 0.5 ml/kg/hr for the last 6 hours? Yes.   Patient's UOP is 2.58 ml/kg/hr 3. Is BUN < 60 mg/dL? Yes.    Patient's BUN today is 21 4. Abnormal electrolyte(s): Potassium 5. Ordered repletion with: Potassium per protocol     Satomi Buda P 03/06/2015 5:52 AM

## 2015-03-06 NOTE — Progress Notes (Signed)
[facility] Progress Note Patient Name: Tiffany [name] DOB: 1929/10/13 MRN: [id number]   Date of Service  03/06/2015  HPI/Events of Note  Atrial Fib with RVR.  HR 150-160.  bp 123/62.  eICU Interventions  cardizem drip ordered Also will have foley cath placed as cannot trach UOP and patient has evidence of fluid overload on cxr and lasix being given.     Intervention Category Intermediate Interventions: Arrhythmia - evaluation and management  Mauri Brooklyn, P 03/06/2015, 1:33 AM

## 2015-03-06 NOTE — Progress Notes (Signed)
Multiple consults during the night with MD, d/t pt unstable with elevated HR and on BIPAP.  New orders received and initiated per MD orders.  Pt status remains unstable this am.  Report given to Day, RN who will continue pt care.

## 2015-03-06 NOTE — Consult Note (Addendum)
CONSULTATION NOTE  Reason for Consult: A-fib with RVR  Requesting Physician: Dr. Halford Chessman  Cardiologist: None (NEW)  HPI: This is a 81 y.o. female with a past medical history significant for hypertension, dyslipidemia, osteoarthritis, Raynaud's syndrome, and some degree of malnutrition, she presented with acute hematemesis consistent with a Dieulafoy lesion. This was successfully clipped with stabilization of her bleeding. She was transfused 2 units yesterday and H&H is now 10 and 30 up from 7 and 23. Last evening she went into A. fib with RVR. She was placed on diltiazem and amiodarone. Unfortunately she had persistent atrial fibrillation with rapid ventricular response. Cardiology was consulted to make recommendations regarding rate control. She denies any chest pain and is unaware of her palpitations. There is no cardiac history that she is aware of.  PMHx:  Past Medical History  Diagnosis Date  . Hypertension   . Arthritis   . Dementia    Past Surgical History  Procedure Laterality Date  . Back surgery  09/25/2011  . Abdominal hysterectomy    . Esophagogastroduodenoscopy N/A 03/01/2015    Procedure: ESOPHAGOGASTRODUODENOSCOPY (EGD);  Surgeon: Arta Silence, MD;  Location: [facility];  Service: Endoscopy;  Laterality: N/A;  . Esophagogastroduodenoscopy N/A 03/02/2015    Procedure: ESOPHAGOGASTRODUODENOSCOPY (EGD);  Surgeon: Arta Silence, MD;  Location: [facility];  Service: Endoscopy;  Laterality: N/A;  bedside    FAMHx: History reviewed. No pertinent family history. father is alive and 12 without cardiac problems. Mother is deceased of "natural causes".  SOCHx:  reports that she has never smoked. She has never used smokeless tobacco. She reports that she drinks alcohol. She reports that she does not use illicit drugs.  ALLERGIES: No Known Allergies  ROS: A comprehensive review of systems was negative except for: Respiratory: positive for dyspnea on exertion  HOME  MEDICATIONS: Prescriptions prior to admission  Medication Sig Dispense Refill Last Dose  . aspirin 81 MG tablet Take 81 mg by mouth daily.   03/01/2015 at Unknown time  . CRANBERRY PO Take 3,600 mg by mouth daily.   03/01/2015 at Unknown time  . diphenhydramine-acetaminophen (TYLENOL PM) 25-500 MG TABS Take 1 tablet by mouth at bedtime as needed.   02/28/2015 at Unknown time  . DULoxetine (CYMBALTA) 60 MG capsule TAKE 1 CAPSULE (60 MG TOTAL) BY MOUTH DAILY. (Patient taking differently: TAKE 1 CAPSULE (60 MG TOTAL) BY MOUTH DAILY as needed for mood) 30 capsule 2 Past Week at Unknown time  . hydrochlorothiazide (HYDRODIURIL) 12.5 MG tablet TAKE 1 TABLET EVERY DAY 90 tablet PRN 03/01/2015 at Unknown time  . lisinopril (PRINIVIL,ZESTRIL) 20 MG tablet TAKE 1 TABLET EVERY DAY 90 tablet PRN 03/01/2015 at Unknown time  . loratadine (CLARITIN) 10 MG tablet Take 1 tablet (10 mg total) by mouth daily. 90 tablet 1 03/01/2015 at Unknown time  . Multiple Vitamin (MULTIVITAMIN) tablet Take 1 tablet by mouth daily.   Past Week at Unknown time  . omeprazole (PRILOSEC) 40 MG capsule Take 1 capsule (40 mg total) by mouth daily. Twice daily (Patient taking differently: Take 40 mg by mouth at bedtime as needed (heartburn/acid reflux). Twice daily) 90 capsule 1 unknown  . traZODone (DESYREL) 50 MG tablet TAKE 0.5-1 TABLETS (25-50 MG TOTAL) BY MOUTH AT BEDTIME AS NEEDED FOR SLEEP. (Patient not taking: Reported on 03/01/2015) 30 tablet 3 Not Taking at Unknown time    HOSPITAL MEDICATIONS: Prior to Admission:  Prescriptions prior to admission  Medication Sig Dispense Refill Last Dose  . aspirin 81 MG  tablet Take 81 mg by mouth daily.   03/01/2015 at Unknown time  . CRANBERRY PO Take 3,600 mg by mouth daily.   03/01/2015 at Unknown time  . diphenhydramine-acetaminophen (TYLENOL PM) 25-500 MG TABS Take 1 tablet by mouth at bedtime as needed.   02/28/2015 at Unknown time  . DULoxetine (CYMBALTA) 60 MG capsule TAKE 1 CAPSULE (60 MG  TOTAL) BY MOUTH DAILY. (Patient taking differently: TAKE 1 CAPSULE (60 MG TOTAL) BY MOUTH DAILY as needed for mood) 30 capsule 2 Past Week at Unknown time  . hydrochlorothiazide (HYDRODIURIL) 12.5 MG tablet TAKE 1 TABLET EVERY DAY 90 tablet PRN 03/01/2015 at Unknown time  . lisinopril (PRINIVIL,ZESTRIL) 20 MG tablet TAKE 1 TABLET EVERY DAY 90 tablet PRN 03/01/2015 at Unknown time  . loratadine (CLARITIN) 10 MG tablet Take 1 tablet (10 mg total) by mouth daily. 90 tablet 1 03/01/2015 at Unknown time  . Multiple Vitamin (MULTIVITAMIN) tablet Take 1 tablet by mouth daily.   Past Week at Unknown time  . omeprazole (PRILOSEC) 40 MG capsule Take 1 capsule (40 mg total) by mouth daily. Twice daily (Patient taking differently: Take 40 mg by mouth at bedtime as needed (heartburn/acid reflux). Twice daily) 90 capsule 1 unknown  . traZODone (DESYREL) 50 MG tablet TAKE 0.5-1 TABLETS (25-50 MG TOTAL) BY MOUTH AT BEDTIME AS NEEDED FOR SLEEP. (Patient not taking: Reported on 03/01/2015) 30 tablet 3 Not Taking at Unknown time    VITALS: Blood pressure 150/79, pulse 151, temperature 98 F (36.7 C), temperature source Axillary, resp. rate 25, height _0  (1.651 m), weight 143 lb 15.4 oz (65.3 kg), SpO2 98 %.  PHYSICAL EXAM: General appearance: alert, mild distress and On nonrebreather mask Neck: no carotid bruit and no JVD Lungs: diminished breath sounds bibasilar and rales bibasilar Heart: irregularly irregular rhythm Abdomen: soft, non-tender; bowel sounds normal; no masses,  no organomegaly Extremities: extremities normal, atraumatic, no cyanosis or edema Pulses: 2+ and symmetric Skin: Skin color, texture, turgor normal. No rashes or lesions Neurologic: Mental status: Alert, oriented, thought content appropriate Psych: Pleasant  LABS: Results for orders placed or performed during the hospital encounter of 03/01/15 (from the past 48 hour(s))  Urinalysis, Routine w reflex microscopic     Status: Abnormal    Collection Time: 03/04/15  9:32 AM  Result Value Ref Range   Color, Urine YELLOW YELLOW   APPearance CLOUDY (A) CLEAR   Specific Gravity, Urine 1.024 1.005 - 1.030   pH 5.5 5.0 - 8.0   Glucose, UA NEGATIVE NEGATIVE mg/dL   Hgb urine dipstick NEGATIVE NEGATIVE   Bilirubin Urine NEGATIVE NEGATIVE   Ketones, ur 40 (A) NEGATIVE mg/dL   Protein, ur NEGATIVE NEGATIVE mg/dL   Urobilinogen, UA 1.0 0.0 - 1.0 mg/dL   Nitrite POSITIVE (A) NEGATIVE   Leukocytes, UA SMALL (A) NEGATIVE  Urine microscopic-add on     Status: Abnormal   Collection Time: 03/04/15  9:32 AM  Result Value Ref Range   Squamous Epithelial / LPF RARE RARE   WBC, UA 7-10 <3 WBC/hpf   RBC / HPF 0-2 <3 RBC/hpf   Bacteria, UA MANY (A) RARE  CBC     Status: Abnormal   Collection Time: 03/04/15  4:35 PM  Result Value Ref Range   WBC 10.6 (H) 4.0 - 10.5 K/uL   RBC 2.56 (L) 3.87 - 5.11 MIL/uL   Hemoglobin 7.6 (L) 12.0 - 15.0 g/dL   HCT 23.6 (L) 36.0 - 46.0 %   MCV 92.2 78.0 -  100.0 fL   MCH 29.7 26.0 - 34.0 pg   MCHC 32.2 30.0 - 36.0 g/dL   RDW 16.4 (H) 11.5 - 15.5 %   Platelets 133 (L) 150 - 400 K/uL  Urinalysis, Routine w reflex microscopic     Status: Abnormal   Collection Time: 03/05/15  4:46 AM  Result Value Ref Range   Color, Urine YELLOW YELLOW   APPearance CLOUDY (A) CLEAR   Specific Gravity, Urine 1.023 1.005 - 1.030   pH 5.5 5.0 - 8.0   Glucose, UA NEGATIVE NEGATIVE mg/dL   Hgb urine dipstick NEGATIVE NEGATIVE   Bilirubin Urine NEGATIVE NEGATIVE   Ketones, ur 15 (A) NEGATIVE mg/dL   Protein, ur NEGATIVE NEGATIVE mg/dL   Urobilinogen, UA 1.0 0.0 - 1.0 mg/dL   Nitrite POSITIVE (A) NEGATIVE   Leukocytes, UA NEGATIVE NEGATIVE  Urine microscopic-add on     Status: Abnormal   Collection Time: 03/05/15  4:46 AM  Result Value Ref Range   WBC, UA 3-6 <3 WBC/hpf   Bacteria, UA MANY (A) RARE  BMET in AM     Status: Abnormal   Collection Time: 03/05/15  5:50 AM  Result Value Ref Range   Sodium 147 (H) 135 - 145  mmol/L   Potassium 4.3 3.5 - 5.1 mmol/L    Comment: RESULT REPEATED AND VERIFIED DELTA CHECK NOTED    Chloride 116 (H) 96 - 112 mmol/L   CO2 23 19 - 32 mmol/L   Glucose, Bld 120 (H) 70 - 99 mg/dL   BUN 28 (H) 6 - 23 mg/dL   Creatinine, Ser 0.57 0.50 - 1.10 mg/dL   Calcium 8.3 (L) 8.4 - 10.5 mg/dL   GFR calc non Af Amer 83 (L) >90 mL/min   GFR calc Af Amer >90 >90 mL/min    Comment: (NOTE) The eGFR has been calculated using the CKD EPI equation. This calculation has not been validated in all clinical situations. eGFR's persistently <90 mL/min signify possible Chronic Kidney Disease.    Anion gap 8 5 - 15  CBC     Status: Abnormal   Collection Time: 03/05/15  5:50 AM  Result Value Ref Range   WBC 8.1 4.0 - 10.5 K/uL   RBC 2.44 (L) 3.87 - 5.11 MIL/uL   Hemoglobin 7.2 (L) 12.0 - 15.0 g/dL   HCT 22.7 (L) 36.0 - 46.0 %   MCV 93.0 78.0 - 100.0 fL   MCH 29.5 26.0 - 34.0 pg   MCHC 31.7 30.0 - 36.0 g/dL   RDW 16.5 (H) 11.5 - 15.5 %   Platelets 132 (L) 150 - 400 K/uL  Prepare RBC     Status: None   Collection Time: 03/05/15  4:15 PM  Result Value Ref Range   Order Confirmation ORDER PROCESSED BY BLOOD BANK   Type and screen     Status: None (Preliminary result)   Collection Time: 03/05/15  6:10 PM  Result Value Ref Range   ABO/RH(D) B NEG    Antibody Screen NEG    Sample Expiration 03/08/2015    Unit Number [id number]    Blood Component Type RED CELLS,LR    Unit division 00    Status of Unit ISSUED    Transfusion Status OK TO TRANSFUSE    Crossmatch Result Compatible    Unit Number [id number]    Blood Component Type RED CELLS,LR    Unit division 00    Status of Unit ALLOCATED    Transfusion Status OK TO TRANSFUSE  Crossmatch Result Compatible   Blood gas, arterial     Status: Abnormal   Collection Time: 03/05/15 11:34 PM  Result Value Ref Range   FIO2 0.70 %   Delivery systems BILEVEL POSITIVE AIRWAY PRESSURE    Mode BILEVEL POSITIVE AIRWAY PRESSURE    Rate  BACK UP RATE OF 8 BPM resp/min   Inspiratory PAP 12    Expiratory PAP 5    pH, Arterial 7.418 7.350 - 7.450   pCO2 arterial 35.6 35.0 - 45.0 mmHg   pO2, Arterial 79.6 (L) 80.0 - 100.0 mmHg   Bicarbonate 22.7 20.0 - 24.0 mEq/L   TCO2 21.6 0 - 100 mmol/L   Acid-base deficit 1.2 0.0 - 2.0 mmol/L   O2 Saturation 95.6 %   Patient temperature 97.8    Collection site BRACHIAL ARTERY    Drawn by [id number]    Sample type ARTERIAL DRAW   Basic metabolic panel     Status: Abnormal   Collection Time: 03/06/15  3:40 AM  Result Value Ref Range   Sodium 142 135 - 145 mmol/L   Potassium 3.5 3.5 - 5.1 mmol/L    Comment: DELTA CHECK NOTED REPEATED TO VERIFY    Chloride 107 96 - 112 mmol/L    Comment: DELTA CHECK NOTED REPEATED TO VERIFY    CO2 24 19 - 32 mmol/L   Glucose, Bld 122 (H) 70 - 99 mg/dL   BUN 21 6 - 23 mg/dL   Creatinine, Ser 0.63 0.50 - 1.10 mg/dL   Calcium 8.4 8.4 - 10.5 mg/dL   GFR calc non Af Amer 81 (L) >90 mL/min   GFR calc Af Amer >90 >90 mL/min    Comment: (NOTE) The eGFR has been calculated using the CKD EPI equation. This calculation has not been validated in all clinical situations. eGFR's persistently <90 mL/min signify possible Chronic Kidney Disease.    Anion gap 11 5 - 15  CBC     Status: Abnormal   Collection Time: 03/06/15  3:40 AM  Result Value Ref Range   WBC 10.3 4.0 - 10.5 K/uL   RBC 3.35 (L) 3.87 - 5.11 MIL/uL   Hemoglobin 9.9 (L) 12.0 - 15.0 g/dL    Comment: DELTA CHECK NOTED REPEATED TO VERIFY POST TRANSFUSION SPECIMEN    HCT 30.3 (L) 36.0 - 46.0 %   MCV 90.4 78.0 - 100.0 fL   MCH 29.6 26.0 - 34.0 pg   MCHC 32.7 30.0 - 36.0 g/dL   RDW 15.6 (H) 11.5 - 15.5 %   Platelets 126 (L) 150 - 400 K/uL  Magnesium     Status: None   Collection Time: 03/06/15  3:40 AM  Result Value Ref Range   Magnesium 1.7 1.5 - 2.5 mg/dL    IMAGING: Dg Chest Port 1 View  03/06/2015   CLINICAL DATA:  Shortness of Breath  EXAM: PORTABLE CHEST - 1 VIEW  COMPARISON:   March 05, 2015  FINDINGS: There is increase in airspace consolidation in the left upper lobe. There is a moderate effusion on the right which is layering. There is a much smaller left effusion. There is stable patchy consolidation in the left base. Heart is mildly enlarged with pulmonary vascularity within normal limits. There is atherosclerotic change in the aortic arch region. Bones are osteoporotic.  IMPRESSION: Increase in left upper lobe airspace consolidation. Patchy infiltrate in the left base remains stable as do pleural effusions bilaterally, larger on the right than on the left. No new opacity is  identified on the right. No change in cardiac silhouette.   Electronically Signed   By: Lowella Grip III M.D.   On: 03/06/2015 07:29   Dg Chest Port 1 View  03/05/2015   CLINICAL DATA:  Mental status change, shortness of breath. Low O2 sats.  EXAM: PORTABLE CHEST - 1 VIEW  COMPARISON:  03/03/2015  FINDINGS: There are bilateral pleural effusions, right larger than left, both enlarging since prior study. Effusion on the right is moderate to large. Worsening aeration in the right lung, likely atelectasis. There is also interstitial prominence throughout the lungs which could reflect edema. Heart is mildly enlarged.  IMPRESSION: Worsening bilateral effusions and bilateral airspace disease, both right greater than left. Airspace disease likely reflects a combination of edema and right side atelectasis.   Electronically Signed   By: Rolm Baptise M.D.   On: 03/05/2015 17:19    HOSPITAL DIAGNOSES: Principal Problem:   Upper GI bleeding Active Problems:   Acute respiratory failure with hypoxemia   Acute respiratory failure   Acute post-hemorrhagic anemia   Encounter for central line placement   Malnutrition of moderate degree   Atrial fibrillation with rapid ventricular response   IMPRESSION/RECOMMENDATION:  1. A. fib with RVR- she went into acute A. fib with RVR overnight as a consequence of  multiple hemodynamic abnormalities. She seems to be resuscitated with regards to her anemia. There is no active bleeding. She was on Cardizem and amiodarone without improvement in rate and was given additional IV Lopressor this morning. Rate slowed and she converted back to sinus rhythm. Her A. fib may be sympathetically driven therefore beta blockers could be very helpful. I'm recommending starting Lopressor 25 mg twice daily with the first dose now. We can then go ahead and wean down the Cardizem. I would continue amiodarone loading, however we may not need to continue oral amiodarone after discharge. Her CHADSVASC score is 4, putting her at high risk of stroke. Ultimately, I would recommend either starting Eliquis or warfarin for anticoagulation given her age and risk factors (directed by GI), however given her recent upper GI bleeding, I would allow her dieulafoy lesion to heal for at least 4 weeks.  Will obtain a 2-D echocardiogram today. Agree with repleting electrolytes. Will administer additional Lasix, she received 2 units of packed red cells yesterday and is almost 5 L positive since admission. Hopefully this will help wean her off of oxygen.  Thanks for consulting [place]. We will follow along with you.  Time Spent Directly with Patient: 45 minutes  Pixie Casino, MD, [facility] Attending Cardiologist [facility]  HILTY,Kenneth C 03/06/2015, 7:59 AM

## 2015-03-06 NOTE — Progress Notes (Signed)
[facility] Progress Note Patient Name: Tiffany [name] DOB: 11-Nov-1929 MRN: [id number]   Date of Service  03/06/2015  HPI/Events of Note  During the night patient developed afib with rvr.  cardizem drip started and patient converted to NSR.  Patient has now gone back into afib and now HR remaining elevated despite cardizem drip at 15 mg/hr  eICU Interventions  If HR does not improve cardiology consult and consider amio drip     Intervention Category Intermediate Interventions: Arrhythmia - evaluation and management  Mauri Brooklyn, P 03/06/2015, 6:54 AM

## 2015-03-07 ENCOUNTER — Inpatient Hospital Stay (HOSPITAL_COMMUNITY): Payer: Commercial Managed Care - HMO

## 2015-03-07 LAB — BASIC METABOLIC PANEL
ANION GAP: 8 (ref 5–15)
BUN: 16 mg/dL (ref 6–23)
CALCIUM: 7.8 mg/dL — AB (ref 8.4–10.5)
CO2: 29 mmol/L (ref 19–32)
CREATININE: 0.6 mg/dL (ref 0.50–1.10)
Chloride: 100 mmol/L (ref 96–112)
GFR calc Af Amer: >90 mL/min (ref >90)
GFR calc non Af Amer: 82 mL/min — ABNORMAL LOW (ref >90)
Glucose, Bld: 133 mg/dL — ABNORMAL HIGH (ref 70–99)
Potassium: 3 mmol/L — ABNORMAL LOW (ref 3.5–5.1)
Sodium: 137 mmol/L (ref 135–145)

## 2015-03-07 LAB — CBC
HEMATOCRIT: 30.8 % — AB (ref 36.0–46.0)
HEMOGLOBIN: 10.1 g/dL — AB (ref 12.0–15.0)
MCH: 29.4 pg (ref 26.0–34.0)
MCHC: 32.8 g/dL (ref 30.0–36.0)
MCV: 89.8 fL (ref 78.0–100.0)
Platelets: 134 10*3/uL — ABNORMAL LOW (ref 150–400)
RBC: 3.43 MIL/uL — ABNORMAL LOW (ref 3.87–5.11)
RDW: 15.3 % (ref 11.5–15.5)
WBC: 8.5 10*3/uL (ref 4.0–10.5)

## 2015-03-07 LAB — URINE CULTURE: Colony Count: >=100000

## 2015-03-07 LAB — MAGNESIUM: MAGNESIUM: 1.7 mg/dL (ref 1.5–2.5)

## 2015-03-07 MED ORDER — CETYLPYRIDINIUM CHLORIDE 0.05 % MT LIQD
7.0000 mL | Freq: Two times a day (BID) | OROMUCOSAL | Status: DC
  Administered 2015-03-08 – 2015-03-12 (×7): 7 mL via OROMUCOSAL

## 2015-03-07 MED ORDER — DEXMEDETOMIDINE HCL IN NACL 200 MCG/50ML IV SOLN
0.4000 ug/kg/h | INTRAVENOUS | Status: DC
  Administered 2015-03-07: 0.4 ug/kg/h via INTRAVENOUS
  Administered 2015-03-07: 0.5 ug/kg/h via INTRAVENOUS
  Administered 2015-03-08: 0.4 ug/kg/h via INTRAVENOUS
  Filled 2015-03-07 (×3): qty 50

## 2015-03-07 MED ORDER — MAGNESIUM SULFATE 2 GM/50ML IV SOLN
2.0000 g | Freq: Once | INTRAVENOUS | Status: AC
  Administered 2015-03-07: 2 g via INTRAVENOUS
  Filled 2015-03-07: qty 50

## 2015-03-07 MED ORDER — CHLORHEXIDINE GLUCONATE 0.12 % MT SOLN
15.0000 mL | Freq: Two times a day (BID) | OROMUCOSAL | Status: DC
  Administered 2015-03-08 – 2015-03-12 (×8): 15 mL via OROMUCOSAL
  Filled 2015-03-07 (×9): qty 15

## 2015-03-07 MED ORDER — POTASSIUM CHLORIDE CRYS ER 20 MEQ PO TBCR
30.0000 meq | EXTENDED_RELEASE_TABLET | ORAL | Status: AC
  Administered 2015-03-07 (×2): 30 meq via ORAL
  Filled 2015-03-07 (×4): qty 1

## 2015-03-07 MED ORDER — FUROSEMIDE 10 MG/ML IJ SOLN
40.0000 mg | Freq: Once | INTRAMUSCULAR | Status: AC
  Administered 2015-03-07: 40 mg via INTRAVENOUS
  Filled 2015-03-07: qty 4

## 2015-03-07 MED ORDER — AMIODARONE HCL 200 MG PO TABS
400.0000 mg | ORAL_TABLET | Freq: Two times a day (BID) | ORAL | Status: DC
  Administered 2015-03-07 – 2015-03-12 (×11): 400 mg via ORAL
  Filled 2015-03-07 (×11): qty 2

## 2015-03-07 MED ORDER — FUROSEMIDE 10 MG/ML IJ SOLN
40.0000 mg | Freq: Once | INTRAMUSCULAR | Status: AC
  Administered 2015-03-07: 40 mg via INTRAVENOUS

## 2015-03-07 MED ORDER — FUROSEMIDE 10 MG/ML IJ SOLN: 40.0000 mg | Freq: Once | INTRAMUSCULAR | Status: DC

## 2015-03-07 MED ORDER — FUROSEMIDE 10 MG/ML IJ SOLN
40.0000 mg | Freq: Two times a day (BID) | INTRAMUSCULAR | Status: DC
  Administered 2015-03-07 – 2015-03-10 (×6): 40 mg via INTRAVENOUS
  Filled 2015-03-07 (×7): qty 4

## 2015-03-07 MED ORDER — POTASSIUM CHLORIDE CRYS ER 20 MEQ PO TBCR
20.0000 meq | EXTENDED_RELEASE_TABLET | Freq: Once | ORAL | Status: AC
  Administered 2015-03-07: 20 meq via ORAL
  Filled 2015-03-07: qty 1

## 2015-03-07 NOTE — Progress Notes (Signed)
PT Cancellation Note  Patient Details Name: Tiffany [name] MRN: [id number] DOB: Dec 21, 1928   Cancelled Treatment:    Reason Eval/Treat Not Completed: Medical issues which prohibited therapy (moved to SDU, on NRB, Afib.)   Claretha Cooper 03/07/2015, 8:07 AM [name] PT [phone]

## 2015-03-07 NOTE — Progress Notes (Signed)
    Subjective:  Denies CP or dyspnea   Objective:  Filed Vitals:   03/07/15 0400 03/07/15 0500 03/07/15 0600 03/07/15 0700  BP: 119/53 145/62 105/47 151/65  Pulse:      Temp: 98.1 F (36.7 C)     TempSrc:      Resp: 20 33 20 24  Height:      Weight:    139 lb 5.3 oz (63.2 kg)  SpO2: 100% 99% 100% 96%    Intake/Output from previous day:  Intake/Output Summary (Last 24 hours) at 03/07/15 0801 Last data filed at 03/07/15 0700  Gross per 24 hour  Intake 2208.8 ml  Output   2330 ml  Net -121.2 ml    Physical Exam: Physical exam: Well-developed well-nourished in no acute distress.  Skin is warm and dry.  HEENT is normal.  Neck is supple.  Chest with basilar crackles and diminished BS RLL Cardiovascular exam is regular rate and rhythm.  Abdominal exam nontender or distended. No masses palpated. Extremities show no edema. neuro grossly intact    Lab Results: Basic Metabolic Panel:  Recent Labs  03/06/15 0340 03/07/15 0330  NA 142 137  K 3.5 3.0*  CL 107 100  CO2 24 29  GLUCOSE 122* 133*  BUN 21 16  CREATININE 0.63 0.60  CALCIUM 8.4 7.8*  MG 1.7 1.7   CBC:  Recent Labs  03/06/15 0340 03/07/15 0330  WBC 10.3 8.5  HGB 9.9* 10.1*  HCT 30.3* 30.8*  MCV 90.4 89.8  PLT 126* 134*    Assessment/Plan:  1 paroxysmal atrial fibrillation-the patient remains in sinus rhythm. I will convert amiodarone to 400 mg by mouth twice a day. Decrease to 200 mg daily at discharge. Would continue for approximately 8 weeks and if she maintains sinus rhythm discontinued at that point. Continue metoprolol. Atrial fibrillation most likely driven by recent bleed and infection. Would not anticoagulate at this point given recent GI bleed. 2 recent GI bleed-hemoglobin stable this morning. 3 urinary tract infection-management per primary care. 4 acute combined systolic/diastolic congestive heart failure-echocardiogram shows ejection fraction 40-45%. Continue beta blocker. Will  give additional 40 mg of Lasix today.  Kirk Ruths 03/07/2015, 8:01 AM

## 2015-03-07 NOTE — Progress Notes (Signed)
CSW continuing to follow.  Pt was seen during the weekend by weekend CSW regarding disposition planning.  CSW received phone call from RN stating that pt family wished to meet with CSW today.  CSW reviewed chart and noted that pt now started on Bipap.  CSW met with pt family at bedside including pt husband.   CSW introduced self and explained role.  CSW discussed with pt family that disposition planning is not appropriate at the given time as pt oxygen needs cannot be managed at SNF level of care. Pt family expressed understanding and were grateful for CSW visit today. CSW discussed that CSW will continue to follow pt progress and will notify family when it becomes appropriate again to explore disposition.   CSW provided emotional support to pt husband surround pt hospitalization and current status. Pt and pt husband have been married for 45 years.   CSW provided pt daughter with CSW contact information.   CSW to continue to follow to provide support and will assist with disposition planning as appropriate.  Alison Murray, MSW, [place] [facility] [phone]

## 2015-03-07 NOTE — Progress Notes (Signed)
CARE MANAGEMENT NOTE 03/07/2015  Patient:  Tiffany [name], Tiffany [name]   Account Number:  [id number]  Date Initiated:  03/02/2015  Documentation initiated by:  DAVIS,RHONDA  Subjective/Objective Assessment:   81 yo WM who presented to [facility] with new onset hematemesis and was taken to Endo suite for scope without source identification. She was intubated for the procedure and remains intubated.     Action/Plan:   tbd determined gi bld continues on [id number] per notes   Anticipated DC Date:  03/10/2015   Anticipated DC Plan:    In-house referral  NA      DC Planning Services  CM consult      [facility] Choice  NA   Choice offered to / List presented to:  NA           Status of service:  In process, will continue to follow Medicare Important Message given?   (If response is "NO", the following Medicare IM given date fields will be blank) Date Medicare IM given:   Medicare IM given by:   Date Additional Medicare IM given:   Additional Medicare IM given by:    Discharge Disposition:    Per UR Regulation:  Reviewed for med. necessity/level of care/duration of stay  If discussed at [place] of Stay Meetings, dates discussed:    Comments:  March 07, 2015/Rhonda L. Rosana Hoes, RN, BSN, CCM. Case Management [place] [phone] No discharge needs present of time of review. Iv amiodarone, nonrebreath er mask at 100% fio2.  March 02, 2015/Rhonda L. Rosana Hoes, RN, BSN, CCM. Case Management [place] [phone] No discharge needs present of time of review.

## 2015-03-07 NOTE — Progress Notes (Signed)
Patient short of breath, respirations 34-36, stating "I'm hot," "I can't breathe." and "I think I'm going to die." O2 turned back up to 15L per min via 100% non rebreather. Patient's head of bed elevated and then lowered according to patient's comfort. RN got patient a fan and a wet towel to help cool her off. MD notified and asked to assess patient. Respiratory called to initiate bi-pap.

## 2015-03-07 NOTE — Progress Notes (Signed)
Speech Language Pathology Treatment: Dysphagia  Patient Details Name: Tiffany [name] MRN: [id number] DOB: 04/03/1929 Today's Date: 03/07/2015 Time: 7893-8101 SLP Time Calculation (min) (ACUTE ONLY): 17 min  Assessment / Plan / Recommendation Clinical Impression  Pt with change in medical status over the weekend, now placed on nonrebreather mask. Per RN, attempts at weaning from NRB result in desaturations, but family is wanting to continue POs, and assisted in providing breakfast meal by removing mask long enough to provide single bites/sips. SLP provided education to pt and family regarding high risk of aspiration with PO intake when pt is requiring NRB, and they again confirm that they would like to continue to provide POs as the pt is requesting, accepting this risk.  SLP provided skilled observation with thin liquids, nectar thick liquids, and pureed solids. Increase in RR noted with thin liquids, although not with other consistencies tested. Min cues were provided for small, single sips and slow rate. Pt had a consistent exhalation upon completion of swallow, which is a good indicator for breath/swallow coordination. As long as pt/family are still wanting to accept the risks of aspiration and continue with PO intake, would recommend Dys 1 diet and nectar thick liquids to facilitate in reducing that risk. Would also crush medications in puree, as son reports pocketing of pills. Will continue to follow closely.   HPI HPI: 81 yo adm to [facility] with GI bleed.  Pt is s/p endoscopy and was placed on ventilator.  She is now extubated as of 930 am today and swallow evaluation was ordered.  Pt found to have right lobe infiltrate/layering right effusion per CXR.  Son present reports pt has had weight loss recently.     Pertinent Vitals Pain Assessment: No/denies pain  SLP Plan  Continue with current plan of care    Recommendations Diet recommendations: Dysphagia 1 (puree);Nectar-thick liquid Liquids  provided via: Cup;Straw Medication Administration: Crushed with puree Supervision: Staff to assist with self feeding;Full supervision/cueing for compensatory strategies Compensations: Slow rate;Small sips/bites (allow frequent rest breaks) Postural Changes and/or Swallow Maneuvers: Seated upright 90 degrees;Upright 30-60 min after meal       Oral Care Recommendations: Oral care BID Follow up Recommendations:  (tba) Plan: Continue with current plan of care    [name], M.A. CCC-SLP [phone]  [name] 03/07/2015, 11:19 AM

## 2015-03-07 NOTE — Progress Notes (Signed)
PULMONARY / CRITICAL CARE MEDICINE   Name: Tiffany [name] MRN: [id number] DOB: 06/29/1929    ADMISSION DATE:  03/01/2015  REFERRING MD : outlaw  CHIEF COMPLAINT:  Hematemesis   INITIAL PRESENTATION:  81 yo female presented with hematemesis.  She required intubation during endoscopy.    STUDIES:  3/23 EGD >> Dieulafoy lesion proximal stomach s/p hemoclips  SIGNIFICANT EVENTS: 3/22 Admit, intubated 3/24 Extubated 3/25 Speech assessment >> D3 diet 3/26 To ICU due to change in mental status 3/27 Refractory a fib, PRBC 1 unit for Hb 7.2, cardiology consulted  SUBJECTIVE:  RN reports SR on amiodarone gtt.  Improved delirium.   VITAL SIGNS: Temp:  [97.6 F (36.4 C)-98.8 F (37.1 C)] 97.6 F (36.4 C) (03/28 0800) Resp:  [20-33] 27 (03/28 0843) BP: (105-152)/(43-85) 138/61 mmHg (03/28 0843) SpO2:  [84 %-100 %] 96 % (03/28 0843) FiO2 (%):  [100 %] 100 % (03/28 0843) Weight:  [139 lb 5.3 oz (63.2 kg)] 139 lb 5.3 oz (63.2 kg) (03/28 0700)   INTAKE / OUTPUT:  Intake/Output Summary (Last 24 hours) at 03/07/15 4656 Last data filed at 03/07/15 0700  Gross per 24 hour  Intake 2135.5 ml  Output   2330 ml  Net -194.5 ml   PHYSICAL EXAMINATION: General: ill appearing elderly female  Neuro: awake, follows simple commands, oriented to person, place HEENT: mm pink/moist, no jvd Cardiovascular: s1s2 rrr, no m/r/g  Lungs: even/non-labored, lungs bilaterally with crackles Abdomen:  Soft, non tender Musculoskeletal: no edema Skin: no rashes  LABS:  CBC  Recent Labs Lab 03/05/15 0550 03/06/15 0340 03/07/15 0330  WBC 8.1 10.3 8.5  HGB 7.2* 9.9* 10.1*  HCT 22.7* 30.3* 30.8*  PLT 132* 126* 134*   Coag's  Recent Labs Lab 03/01/15 0958 03/01/15 1400 03/03/15 0330  APTT  --  30 30  INR 1.26 1.25 1.30   BMET  Recent Labs Lab 03/05/15 0550 03/06/15 0340 03/07/15 0330  NA 147* 142 137  K 4.3 3.5 3.0*  CL 116* 107 100  CO2 23 24 29   BUN 28* 21 16  CREATININE  0.57 0.63 0.60  GLUCOSE 120* 122* 133*   Electrolytes  Recent Labs Lab 03/02/15 0300 03/03/15 0330 03/04/15 0230 03/05/15 0550 03/06/15 0340 03/07/15 0330  CALCIUM 8.2* 8.1* 8.1* 8.3* 8.4 7.8*  MG 1.8 1.8 2.2  --  1.7 1.7  PHOS 3.9 3.0 2.4  --   --   --    ABG  Recent Labs Lab 03/01/15 1358 03/02/15 0730 03/05/15 2334  PHART 7.338* 7.385 7.418  PCO2ART 47.4* 41.0 35.6  PO2ART 432.0* 80.4 79.6*   Liver Enzymes  Recent Labs Lab 03/01/15 0958 03/01/15 1400  AST 20 21  ALT 19 16  ALKPHOS 92 72  BILITOT 0.6 0.6  ALBUMIN 3.5 3.0*   Cardiac Enzymes No results for input(s): TROPONINI, PROBNP in the last 168 hours.   Glucose  Recent Labs Lab 03/02/15 1356  GLUCAP 104*   Imaging Dg Chest Port 1 View  03/06/2015   CLINICAL DATA:  Shortness of Breath  EXAM: PORTABLE CHEST - 1 VIEW  COMPARISON:  March 05, 2015  FINDINGS: There is increase in airspace consolidation in the left upper lobe. There is a moderate effusion on the right which is layering. There is a much smaller left effusion. There is stable patchy consolidation in the left base. Heart is mildly enlarged with pulmonary vascularity within normal limits. There is atherosclerotic change in the aortic arch region. Bones are osteoporotic.  IMPRESSION: Increase in left upper lobe airspace consolidation. Patchy infiltrate in the left base remains stable as do pleural effusions bilaterally, larger on the right than on the left. No new opacity is identified on the right. No change in cardiac silhouette.   Electronically Signed   By: Lowella Grip III M.D.   On: 03/06/2015 07:29   ASSESSMENT / PLAN:  PULMONARY ETT 3/22 for Endo>> 3/24 A: Acute respiratory failure from pulmonary edema. P:   Oxygen to keep SpO2 > 92% PRN BiPAP F/u CXR Diurese as tolerated  CARDIOVASCULAR CVL None A:  New onset A fib with RVR 3/27 >> converted to sinus after dose of lopressor 3/27. Hx of HTN. P:  Lopressor, amiodarone per  cardiology No anticoagulation in setting of GI bleed No ASA for 6 weeks after EGD  RENAL A: Hypokalemia. Hypomagnesemia. P:   Goal K > 4, goal Mg > 2  GASTROINTESTINAL A:   Upper GI bleeding 2nd to Dieulafoy lesion. Dysphagia. P:   D3 diet Continue Protonix  Will need f/u with GI as outpt  HEMATOLOGIC A:   Acute blood loss anemia 2nd to GI bleed. Thrombocytopenia from consumption related to bleeding. P:  F/u CBC Transfuse for Hb < 7 or bleeding SCD's for DVT prevention  INFECTIOUS A:  UTI. P:   Day 3/x Cipro  Urine 3/26 >> E-Coli >>   ENDOCRINE A:   No acute issue. P:   Monitor blood sugar on BMET  NEUROLOGIC A: Hx of dementia. P:   Monitor mental status    Noe Gens, NP-C [facility] Pgr: [phone] or if no answer [phone]    03/07/2015, 9:28 AM

## 2015-03-07 NOTE — Progress Notes (Signed)
   03/07/15 1755  Vitals  BP (!) 75/33 mmHg  MAP (mmHg) 45  ECG Heart Rate 62  Resp (!) 24  Oxygen Therapy  SpO2 96 %  O2 Device Bi-PAP   Elink notified of dropping BP. Precedex gtt stopped. Per Dr. Wallis Bamberg no fluids given d/t pulmonary edema today. Instructed RN to monitor for the next half hour for improvement.

## 2015-03-07 NOTE — Progress Notes (Signed)
RN concerned about worsening resp status and some increased confusion  Plan bipap Lasix precedex Intubate if worse  Dr. Brand Males, M.D., [facility].C.P Pulmonary and Critical Care Medicine Staff Physician [place] Pager: [phone], If no answer or between  15:00h - 7:00h: call [phone]  03/07/2015 2:13 PM

## 2015-03-07 NOTE — Progress Notes (Signed)
[facility] ADULT ICU REPLACEMENT PROTOCOL FOR AM LAB REPLACEMENT ONLY  The patient does apply for the [facility] Adult ICU Electrolyte Replacment Protocol based on the criteria listed below:   1. Is GFR >/= 40 ml/min? Yes.    Patient's GFR today is 82 2. Is urine output >/= 0.5 ml/kg/hr for the last 6 hours? Yes.   Patient's UOP is .5 ml/kg/hr 3. Is BUN < 60 mg/dL? Yes.    Patient's BUN today is 16 4. Abnormal electrolyte K 3.0 5. Ordered repletion with: per protocol 6. If a panic level lab has been reported, has the CCM MD in charge been notified? Yes.  .   Physician:  Loney Laurence 03/07/2015 6:26 AM

## 2015-03-08 ENCOUNTER — Inpatient Hospital Stay (HOSPITAL_COMMUNITY): Payer: Commercial Managed Care - HMO

## 2015-03-08 LAB — CBC
HCT: 31.2 % — ABNORMAL LOW (ref 36.0–46.0)
HEMOGLOBIN: 10 g/dL — AB (ref 12.0–15.0)
MCH: 28.8 pg (ref 26.0–34.0)
MCHC: 32.1 g/dL (ref 30.0–36.0)
MCV: 89.9 fL (ref 78.0–100.0)
Platelets: 121 10*3/uL — ABNORMAL LOW (ref 150–400)
RBC: 3.47 MIL/uL — AB (ref 3.87–5.11)
RDW: 15.2 % (ref 11.5–15.5)
WBC: 10 10*3/uL (ref 4.0–10.5)

## 2015-03-08 LAB — BASIC METABOLIC PANEL
ANION GAP: 12 (ref 5–15)
Anion gap: 11 (ref 5–15)
BUN: 16 mg/dL (ref 6–23)
BUN: 18 mg/dL (ref 6–23)
CALCIUM: 7.7 mg/dL — AB (ref 8.4–10.5)
CO2: 29 mmol/L (ref 19–32)
CO2: 30 mmol/L (ref 19–32)
CREATININE: 0.74 mg/dL (ref 0.50–1.10)
Calcium: 7.9 mg/dL — ABNORMAL LOW (ref 8.4–10.5)
Chloride: 94 mmol/L — ABNORMAL LOW (ref 96–112)
Chloride: 95 mmol/L — ABNORMAL LOW (ref 96–112)
Creatinine, Ser: 0.96 mg/dL (ref 0.50–1.10)
GFR calc Af Amer: 62 mL/min — ABNORMAL LOW (ref >90)
GFR calc non Af Amer: 53 mL/min — ABNORMAL LOW (ref >90)
GFR, EST AFRICAN AMERICAN: 89 mL/min — AB (ref >90)
GFR, EST NON AFRICAN AMERICAN: 77 mL/min — AB (ref >90)
GLUCOSE: 114 mg/dL — AB (ref 70–99)
Glucose, Bld: 114 mg/dL — ABNORMAL HIGH (ref 70–99)
POTASSIUM: 2.9 mmol/L — AB (ref 3.5–5.1)
POTASSIUM: 3.4 mmol/L — AB (ref 3.5–5.1)
SODIUM: 134 mmol/L — AB (ref 135–145)
Sodium: 137 mmol/L (ref 135–145)

## 2015-03-08 LAB — SEDIMENTATION RATE: Sed Rate: 24 mm/hr — ABNORMAL HIGH (ref 0–22)

## 2015-03-08 LAB — BRAIN NATRIURETIC PEPTIDE: B Natriuretic Peptide: 567.5 pg/mL — ABNORMAL HIGH (ref 0.0–100.0)

## 2015-03-08 LAB — MAGNESIUM: Magnesium: 1.7 mg/dL (ref 1.5–2.5)

## 2015-03-08 MED ORDER — POTASSIUM CHLORIDE CRYS ER 20 MEQ PO TBCR
40.0000 meq | EXTENDED_RELEASE_TABLET | ORAL | Status: AC
  Administered 2015-03-08 (×2): 40 meq via ORAL
  Filled 2015-03-08 (×2): qty 2

## 2015-03-08 MED ORDER — MAGNESIUM SULFATE 2 GM/50ML IV SOLN
2.0000 g | Freq: Once | INTRAVENOUS | Status: AC
  Administered 2015-03-08: 2 g via INTRAVENOUS
  Filled 2015-03-08: qty 50

## 2015-03-08 MED ORDER — POTASSIUM CHLORIDE CRYS ER 20 MEQ PO TBCR
40.0000 meq | EXTENDED_RELEASE_TABLET | Freq: Two times a day (BID) | ORAL | Status: DC
  Administered 2015-03-09 (×2): 40 meq via ORAL
  Filled 2015-03-08 (×2): qty 2

## 2015-03-08 NOTE — Progress Notes (Signed)
Speech Language Pathology Treatment: Dysphagia  Patient Details Name: Tiffany [name] MRN: [id number] DOB: 04/17/1929 Today's Date: 03/08/2015 Time: 4818-5631 SLP Time Calculation (min) (ACUTE ONLY): 15 min  Assessment / Plan / Recommendation Clinical Impression  Pt appears to be tolerating a dysphagia 1 diet with nectar thick liquids via straw, moving the non-rebreather mask for each bite/sip and immediately replacing.  Swallow appears to be timely, with apparent adequate laryngeal elevation palpated.  No cough or throat clearing, and voice remained clear.  RN and family report pt has not had any overt difficulty.   HPI HPI: 81 yo adm to [facility] with GI bleed.  Pt is s/p endoscopy and was placed on ventilator.  She is now extubated as of 930 am today and swallow evaluation was ordered.  Pt found to have right lobe infiltrate/layering right effusion per CXR.  Son present reports pt has had weight loss recently.     Pertinent Vitals Pain Assessment: No/denies pain  SLP Plan  Continue with current plan of care    Recommendations Diet recommendations: Dysphagia 1 (puree);Nectar-thick liquid Liquids provided via: Cup;Straw Medication Administration: Crushed with puree Supervision: Staff to assist with self feeding;Full supervision/cueing for compensatory strategies Compensations: Slow rate;Small sips/bites Postural Changes and/or Swallow Maneuvers: Seated upright 90 degrees;Upright 30-60 min after meal              Oral Care Recommendations: Oral care BID Follow up Recommendations: None Plan: Continue with current plan of care    GO     Quinn Axe T 03/08/2015, 12:01 PM

## 2015-03-08 NOTE — Progress Notes (Signed)
PULMONARY / CRITICAL CARE MEDICINE   Name: Tiffany [name] MRN: [id number] DOB: 11-04-1929    ADMISSION DATE:  03/01/2015  REFERRING MD : Paulita Fujita  CHIEF COMPLAINT:  Hematemesis   INITIAL PRESENTATION:  81 y/o female presented with hematemesis.  She required intubation during endoscopy.    STUDIES:  3/23 EGD >> Dieulafoy lesion proximal stomach s/p hemoclips  SIGNIFICANT EVENTS: 3/22 Admit, intubated 3/24 Extubated 3/25 Speech assessment >> D3 diet 3/26 To ICU due to change in mental status 3/27 Refractory a fib, PRBC 1 unit for Hb 7.2, cardiology consulted 3/28 Worsening resp status / confusion.  BiPAP + Precedex   SUBJECTIVE:  RN reports pt off precedex (stopped due to hypotension) & bipap.  Pt denies shortness of breath.  Net neg 1.4L with lasix  VITAL SIGNS: Temp:  [97.1 F (36.2 C)-98.3 F (36.8 C)] 98 F (36.7 C) (03/29 0800) Pulse Rate:  [63-79] 66 (03/29 0323) Resp:  [17-29] 25 (03/29 0600) BP: (75-138)/(33-82) 87/33 mmHg (03/29 0600) SpO2:  [91 %-100 %] 98 % (03/29 0600) FiO2 (%):  [100 %] 100 % (03/28 0843) Weight:  [133 lb 9.6 oz (60.6 kg)] 133 lb 9.6 oz (60.6 kg) (03/29 0400)   INTAKE / OUTPUT:  Intake/Output Summary (Last 24 hours) at 03/08/15 0810 Last data filed at 03/08/15 0630  Gross per 24 hour  Intake 1121.31 ml  Output   2525 ml  Net -1403.69 ml   PHYSICAL EXAMINATION: General: ill appearing elderly female  Neuro: awake, follows simple commands, oriented to person, place HEENT: mm pink/moist, no jvd Cardiovascular: s1s2 rrr, no m/r/g  Lungs: even/non-labored, lungs bilaterally diminished  Abdomen:  Soft, non tender Musculoskeletal: no edema Skin: no rashes  LABS:  CBC  Recent Labs Lab 03/06/15 0340 03/07/15 0330 03/08/15 0355  WBC 10.3 8.5 10.0  HGB 9.9* 10.1* 10.0*  HCT 30.3* 30.8* 31.2*  PLT 126* 134* 121*   Coag's  Recent Labs Lab 03/01/15 0958 03/01/15 1400 03/03/15 0330  APTT  --  30 30  INR 1.26 1.25 1.30    BMET  Recent Labs Lab 03/06/15 0340 03/07/15 0330 03/08/15 0355  NA 142 137 134*  K 3.5 3.0* 2.9*  CL 107 100 94*  CO2 24 29 29   BUN 21 16 16   CREATININE 0.63 0.60 0.74  GLUCOSE 122* 133* 114*   Electrolytes  Recent Labs Lab 03/02/15 0300 03/03/15 0330 03/04/15 0230  03/06/15 0340 03/07/15 0330 03/08/15 0355  CALCIUM 8.2* 8.1* 8.1*  < > 8.4 7.8* 7.7*  MG 1.8 1.8 2.2  --  1.7 1.7 1.7  PHOS 3.9 3.0 2.4  --   --   --   --   < > = values in this interval not displayed.   ABG  Recent Labs Lab 03/01/15 1358 03/02/15 0730 03/05/15 2334  PHART 7.338* 7.385 7.418  PCO2ART 47.4* 41.0 35.6  PO2ART 432.0* 80.4 79.6*   Liver Enzymes  Recent Labs Lab 03/01/15 0958 03/01/15 1400  AST 20 21  ALT 19 16  ALKPHOS 92 72  BILITOT 0.6 0.6  ALBUMIN 3.5 3.0*   Cardiac Enzymes No results for input(s): TROPONINI, PROBNP in the last 168 hours.   Glucose  Recent Labs Lab 03/02/15 1356  GLUCAP 104*   Imaging Dg Chest Port 1 View  03/07/2015   CLINICAL DATA:  CHF.  EXAM: PORTABLE CHEST - 1 VIEW  COMPARISON:  None.  FINDINGS: Mediastinum hilar structures stable. Heart size stable. Diffuse bilateral pulmonary infiltrates with mild pleural effusions  remain. Pleural effusions appear to have increased. No pneumothorax. No acute osseus abnormality . Surgical clips upper abdomen.  IMPRESSION: 1. Persistent trauma bilateral pulmonary infiltrates/edema. 2. Bilateral pleural effusions, increased from prior exam.   Electronically Signed   By: [place]   On: 03/07/2015 07:11   ASSESSMENT / PLAN:  PULMONARY ETT 3/22 for Endo>> 3/24 A: Acute respiratory failure from pulmonary edema. Bilateral Infiltrates - unclear etiology, initially suspected to be pulmonary edema but no clearing with lasix, ? Pneumonitis vs infectious etiolgy.  Sed rate 24, BNP 567 P:   Oxygen to keep SpO2 > 92% PRN BiPAP F/u CXR Diurese as tolerated, lasix 40 Q12  Consider high resolution CT of chest  to better define infiltrates  CARDIOVASCULAR CVL None A:  New onset A fib with RVR 3/27 >> converted to sinus after dose of lopressor 3/27. Hx of HTN. P:  Lopressor, amiodarone per cardiology No anticoagulation in setting of GI bleed No ASA for 6 weeks after EGD  RENAL A: Hypokalemia. Hypomagnesemia. P:   Goal K > 4, goal Mg > 2  GASTROINTESTINAL A:   Upper GI bleeding 2nd to Dieulafoy lesion. Dysphagia. P:   D1 diet, nectar thick liquids Continue Protonix  Will need f/u with GI as outpt  HEMATOLOGIC A:   Acute blood loss anemia 2nd to GI bleed. Thrombocytopenia from consumption related to bleeding. P:  F/u CBC Transfuse for Hb < 7 or bleeding SCD's for DVT prevention  INFECTIOUS A:  UTI. P:   Day 4/4 Cipro  Urine 3/26 >> E-Coli >> sens CIPRO Hold further abx, monitor fever curve / wbc  ENDOCRINE A:   No acute issue. P:   Monitor blood sugar on BMET  NEUROLOGIC A: Hx of dementia. P:   Monitor mental status PT as tolerated     Noe Gens, NP-C [facility] Pgr: [phone] or if no answer [phone]  03/08/2015, 8:10 AM

## 2015-03-08 NOTE — Progress Notes (Signed)
[facility] ADULT ICU REPLACEMENT PROTOCOL FOR AM LAB REPLACEMENT ONLY  The patient does apply for the [facility] Adult ICU Electrolyte Replacment Protocol based on the criteria listed below:   1. Is GFR >/= 40 ml/min? Yes.    Patient's GFR today is 77 2. Is urine output >/= 0.5 ml/kg/hr for the last 6 hours? Yes.   Patient's UOP is 1.52 ml/kg/hr 3. Is BUN < 60 mg/dL? Yes.    Patient's BUN today is 16 4. Abnormal electrolyte K 2.9, Mg 1.7,  5. Ordered repletion with: per protocol 6. If a panic level lab has been reported, has the CCM MD in charge been notified? Yes.  .   Physician:  Loney Laurence 03/08/2015 5:54 AM

## 2015-03-08 NOTE — Progress Notes (Signed)
PT Cancellation Note  Patient Details Name: Tiffany [name] MRN: [id number] DOB: 07-Aug-1929   Cancelled Treatment:    Reason Eval/Treat Not Completed: Medical issues which prohibited therapy (currently requires BiPAP, hypotension)   Claretha Cooper 03/08/2015, 7:34 AM [name] PT [phone]

## 2015-03-08 NOTE — Progress Notes (Signed)
    Subjective:  Denies CP or dyspnea   Objective:  Filed Vitals:   03/08/15 0300 03/08/15 0323 03/08/15 0400 03/08/15 0500  BP: 112/48 112/48 92/54 119/46  Pulse:  66    Temp:   98.3 F (36.8 C)   TempSrc:   Axillary   Resp: 20 22 22 25   Height:      Weight:   133 lb 9.6 oz (60.6 kg)   SpO2: 92% 93% 94% 96%    Intake/Output from previous day:  Intake/Output Summary (Last 24 hours) at 03/08/15 0615 Last data filed at 03/08/15 0500  Gross per 24 hour  Intake 1007.56 ml  Output   2525 ml  Net -1517.44 ml    Physical Exam: Physical exam: Well-developed well-nourished in no acute distress.  Skin is warm and dry.  HEENT is normal.  Neck is supple.  Chest with basilar crackles and diminished BS RLL Cardiovascular exam is regular rate and rhythm.  Abdominal exam nontender or distended. No masses palpated. Extremities show no edema. neuro grossly intact    Lab Results: Basic Metabolic Panel:  Recent Labs  03/07/15 0330 03/08/15 0355  NA 137 134*  K 3.0* 2.9*  CL 100 94*  CO2 29 29  GLUCOSE 133* 114*  BUN 16 16  CREATININE 0.60 0.74  CALCIUM 7.8* 7.7*  MG 1.7 1.7   CBC:  Recent Labs  03/07/15 0330 03/08/15 0355  WBC 8.5 10.0  HGB 10.1* 10.0*  HCT 30.8* 31.2*  MCV 89.8 89.9  PLT 134* 121*    Assessment/Plan:  1 paroxysmal atrial fibrillation-the patient remains in sinus rhythm. Continue amiodarone 400 mg by mouth twice a day. Decrease to 200 mg daily at discharge. Would continue for approximately 8 weeks and if she maintains sinus rhythm discontinue at that point. Discontinue metoprolol as BP borderline. Atrial fibrillation most likely driven by recent bleed and infection. Would not anticoagulate at this point given recent GI bleed. 2 recent GI bleed-hemoglobin stable this morning. 3 urinary tract infection-management per primary care. 4 acute combined systolic/diastolic congestive heart failure-echocardiogram shows ejection fraction 40-45%.  Continue beta blocker. Continue diuresis as tolerated by BP. BNP minimally elevated; possible component of ALI. 5 Hypokalemia-supplement  Kirk Ruths 03/08/2015, 6:15 AM

## 2015-03-09 ENCOUNTER — Inpatient Hospital Stay (HOSPITAL_COMMUNITY): Payer: Commercial Managed Care - HMO

## 2015-03-09 DIAGNOSIS — J90 Pleural effusion, not elsewhere classified: Secondary | ICD-10-CM | POA: Insufficient documentation

## 2015-03-09 DIAGNOSIS — R0600 Dyspnea, unspecified: Secondary | ICD-10-CM | POA: Insufficient documentation

## 2015-03-09 DIAGNOSIS — J811 Chronic pulmonary edema: Secondary | ICD-10-CM | POA: Insufficient documentation

## 2015-03-09 LAB — CBC
HEMATOCRIT: 33 % — AB (ref 36.0–46.0)
HEMOGLOBIN: 10.5 g/dL — AB (ref 12.0–15.0)
MCH: 28.8 pg (ref 26.0–34.0)
MCHC: 31.8 g/dL (ref 30.0–36.0)
MCV: 90.4 fL (ref 78.0–100.0)
Platelets: 185 10*3/uL (ref 150–400)
RBC: 3.65 MIL/uL — AB (ref 3.87–5.11)
RDW: 15.2 % (ref 11.5–15.5)
WBC: 12.5 10*3/uL — ABNORMAL HIGH (ref 4.0–10.5)

## 2015-03-09 LAB — LACTATE DEHYDROGENASE, PLEURAL OR PERITONEAL FLUID: LD, Fluid: 63 U/L — ABNORMAL HIGH (ref 3–23)

## 2015-03-09 LAB — BASIC METABOLIC PANEL
Anion gap: 10 (ref 5–15)
BUN: 19 mg/dL (ref 6–23)
CO2: 32 mmol/L (ref 19–32)
CREATININE: 0.89 mg/dL (ref 0.50–1.10)
Calcium: 8 mg/dL — ABNORMAL LOW (ref 8.4–10.5)
Chloride: 96 mmol/L (ref 96–112)
GFR calc Af Amer: 68 mL/min — ABNORMAL LOW (ref >90)
GFR calc non Af Amer: 58 mL/min — ABNORMAL LOW (ref >90)
GLUCOSE: 119 mg/dL — AB (ref 70–99)
POTASSIUM: 3.3 mmol/L — AB (ref 3.5–5.1)
Sodium: 138 mmol/L (ref 135–145)

## 2015-03-09 LAB — PROTEIN, BODY FLUID

## 2015-03-09 LAB — TYPE AND SCREEN
ABO/RH(D): B NEG
Antibody Screen: NEGATIVE
Unit division: 0
Unit division: 0

## 2015-03-09 LAB — MAGNESIUM: MAGNESIUM: 2.1 mg/dL (ref 1.5–2.5)

## 2015-03-09 LAB — BODY FLUID CELL COUNT WITH DIFFERENTIAL
Lymphs, Fluid: 38 %
Monocyte-Macrophage-Serous Fluid: 34 % — ABNORMAL LOW (ref 50–90)
NEUTROPHIL FLUID: 28 % — AB (ref 0–25)
WBC FLUID: 164 uL (ref 0–1000)

## 2015-03-09 LAB — SEDIMENTATION RATE: Sed Rate: 38 mm/hr — ABNORMAL HIGH (ref 0–22)

## 2015-03-09 LAB — CHOLESTEROL, TOTAL: Cholesterol: 148 mg/dL (ref 0–200)

## 2015-03-09 LAB — LACTATE DEHYDROGENASE: LDH: 198 U/L (ref 94–250)

## 2015-03-09 LAB — PROTEIN, TOTAL: Total Protein: 5.8 g/dL — ABNORMAL LOW (ref 6.0–8.3)

## 2015-03-09 MED ORDER — METOPROLOL SUCCINATE ER 25 MG PO TB24
25.0000 mg | ORAL_TABLET | Freq: Every day | ORAL | Status: DC
  Administered 2015-03-09: 25 mg via ORAL
  Filled 2015-03-09: qty 1

## 2015-03-09 MED ORDER — DIPHENHYDRAMINE HCL 25 MG PO CAPS: 25.0000 mg | ORAL_CAPSULE | Freq: Every day | ORAL | Status: DC

## 2015-03-09 MED ORDER — POTASSIUM CHLORIDE CRYS ER 20 MEQ PO TBCR
20.0000 meq | EXTENDED_RELEASE_TABLET | Freq: Once | ORAL | Status: AC
  Administered 2015-03-09: 20 meq via ORAL
  Filled 2015-03-09: qty 1

## 2015-03-09 MED ORDER — ZOLPIDEM TARTRATE 5 MG PO TABS
5.0000 mg | ORAL_TABLET | Freq: Every day | ORAL | Status: DC
  Administered 2015-03-09 – 2015-03-11 (×2): 5 mg via ORAL
  Filled 2015-03-09 (×2): qty 1

## 2015-03-09 NOTE — Progress Notes (Deleted)
[facility] ADULT ICU REPLACEMENT PROTOCOL FOR AM LAB REPLACEMENT ONLY  The patient does apply for the [facility] Adult ICU Electrolyte Replacment Protocol based on the criteria listed below:   1. Is GFR >/= 40 ml/min? Yes.    Patient's GFR today is 58 2. Is urine output >/= 0.5 ml/kg/hr for the last 6 hours? Yes.   Patient's UOP is .9 ml/kg/hr 3. Is BUN < 60 mg/dL? Yes.    Patient's BUN today is 19 4. Abnormal electrolyte K 3.3 5. Ordered repletion with: per protocol 6. If a panic level lab has been reported, has the CCM MD in charge been notified? Yes.  .   Physician:  Illene Labrador, Canary Brim 03/09/2015 4:53 AM

## 2015-03-09 NOTE — Progress Notes (Signed)
  NUTRITION FOLLOW UP  Intervention:   Encourage PO intake  Nutrition Dx:   Inadequate oral intake related to decreased appetite as evidenced by GI discomfort/bleed. Ongoing  Goal:   Pt to meet >/= 90% of estimated needs. Not met  Monitor:   PO intake, weight trends, labs  Assessment:   Pt eating at the time of visit, diet just got advanced to Dysphagia 3 with thin liquids. Consumed over 50% at the time of visit.  - Pt states that she was never a big eater and usually consumes just 2 meals at home - Per Pt her usual body weight is about 130 - 135 Lb, and she feels like she lost a little weight recently (3 Lb in over 3 mo, not significant for time frame) - Pt reports starting to feel hungry and refuses any type of supplements or snacks, states that she is satisfied with her current diet and can fulfil her needs that way - Per family at bed side, this is the best she looked and felt in days, grateful for the visit and concern   Height: Ht Readings from Last 1 Encounters:  03/01/15 $RemoveB'5\' 5"'[id number]$  (1.651 m)    Weight Status:   Wt Readings from Last 1 Encounters:  03/09/15 128 lb 15.5 oz (58.5 kg)    Re-estimated needs:  Kcal: 1800-2000 Protein: 90-105 g protein Fluid: >/= 1.9 L  Skin: WDL  Diet Order: DIET DYS 3 Room service appropriate?: Yes; Fluid consistency:: Thin   Intake/Output Summary (Last 24 hours) at 03/09/15 1404 Last data filed at 03/09/15 0800  Gross per 24 hour  Intake    670 ml  Output   1965 ml  Net  -1295 ml    Last BM: 03/09/2015   Labs:   Recent Labs Lab 03/03/15 0330 03/04/15 0230  03/07/15 0330 03/08/15 0355 03/08/15 2022 03/09/15 0330  NA 146* 145  < > 137 134* 137 138  K 3.8 3.4*  < > 3.0* 2.9* 3.4* 3.3*  CL 114* 113*  < > 100 94* 95* 96  CO2 25 22  < > $R'29 29 30 'or$ 32  BUN 48* 35*  < > $R'16 16 18 19  'zY$ CREATININE 0.83 0.74  < > 0.60 0.74 0.96 0.89  CALCIUM 8.1* 8.1*  < > 7.8* 7.7* 7.9* 8.0*  MG 1.8 2.2  < > 1.7 1.7  --  2.1  PHOS 3.0 2.4  --    --   --   --   --   GLUCOSE 128* 118*  < > 133* 114* 114* 119*  < > = values in this interval not displayed.  CBG (last 3)  No results for input(s): GLUCAP in the last 72 hours.  Scheduled Meds: . amiodarone  400 mg Oral BID  . antiseptic oral rinse  7 mL Mouth Rinse q12n4p  . chlorhexidine  15 mL Mouth Rinse BID  . furosemide  40 mg Intravenous Q12H  . metoprolol succinate  25 mg Oral Daily  . pantoprazole  40 mg Oral BID AC  . potassium chloride  40 mEq Oral BID    Continuous Infusions: . dextrose 30 mL/hr at 03/09/15 0208     Jyasia Markoff A. [place] Dietetic Intern Pager: [phone] 03/09/2015 1:40 PM

## 2015-03-09 NOTE — Progress Notes (Signed)
    Subjective:  Denies CP or dyspnea   Objective:  Filed Vitals:   03/09/15 0844 03/09/15 0845 03/09/15 0900 03/09/15 0910  BP:   130/44   Pulse:      Temp:      TempSrc:      Resp: 23 25 20 20   Height:      Weight:      SpO2: 100%  100% 97%    Intake/Output from previous day:  Intake/Output Summary (Last 24 hours) at 03/09/15 0940 Last data filed at 03/09/15 0800  Gross per 24 hour  Intake    880 ml  Output   2135 ml  Net  -1255 ml    Physical Exam: Physical exam: Well-developed frail in no acute distress.  Skin is warm and dry.  HEENT is normal.  Neck is supple.  Chest with basilar crackles and diminished BS RLL Cardiovascular exam is regular rate and rhythm.  Abdominal exam nontender or distended. No masses palpated. Extremities show no edema. neuro grossly intact    Lab Results: Basic Metabolic Panel:  Recent Labs  03/08/15 0355 03/08/15 2022 03/09/15 0330  NA 134* 137 138  K 2.9* 3.4* 3.3*  CL 94* 95* 96  CO2 29 30 32  GLUCOSE 114* 114* 119*  BUN 16 18 19   CREATININE 0.74 0.96 0.89  CALCIUM 7.7* 7.9* 8.0*  MG 1.7  --  2.1   CBC:  Recent Labs  03/08/15 0355 03/09/15 0330  WBC 10.0 12.5*  HGB 10.0* 10.5*  HCT 31.2* 33.0*  MCV 89.9 90.4  PLT 121* 185    Assessment/Plan:  1 paroxysmal atrial fibrillation-the patient remains in sinus rhythm this AM although had brief recurrent atrial fibrillation earlier. Continue amiodarone 400 mg by mouth twice a day. Decrease to 200 mg daily at discharge. Would continue for approximately 8 weeks and if she maintains sinus rhythm discontinue at that point. Will resume low dose toprol. Atrial fibrillation most likely driven by recent bleed and infection. Would not anticoagulate at this point given recent GI bleed. 2 recent GI bleed-hemoglobin stable this morning. 3 urinary tract infection-management per primary care. 4 acute combined systolic/diastolic congestive heart failure-echocardiogram shows  ejection fraction 40-45%. Continue diuresis. Agree with thoracentesis right pleural effusion. 5 Hypokalemia-supplement  Kirk Ruths 03/09/2015, 9:40 AM

## 2015-03-09 NOTE — Progress Notes (Signed)
PULMONARY / CRITICAL CARE MEDICINE   Name: Tiffany [name] MRN: [id number] DOB: 27-Feb-1929    ADMISSION DATE:  03/01/2015  REFERRING MD : Paulita Fujita  CHIEF COMPLAINT:  Hematemesis   INITIAL PRESENTATION:  81 y/o female presented with hematemesis.  She required intubation during endoscopy.    STUDIES:  3/23 EGD >> Dieulafoy lesion proximal stomach s/p hemoclips  SIGNIFICANT EVENTS: 3/22 Admit, intubated 3/24 Extubated 3/25 Speech assessment >> D3 diet 3/26 To ICU due to change in mental status 3/27 Refractory a fib, PRBC 1 unit for Hb 7.2, cardiology consulted 3/28 Worsening resp status / confusion.  BiPAP + Precedex  3/29 Off bipap & precedex, NRB  3/29 CT Chest >> GGO in LUL, Large R, small L pleural effusions with passive atelectasis, 3 vessel CAD, spleenomegaly 3/30 Weaned to 4L, Afib w RVR with standing    SUBJECTIVE:  RN reports pt weaned to 4L [place], got up with PT and converted to AF w RVR, back to bed and now rate controlled afib  VITAL SIGNS: Temp:  [97.1 F (36.2 C)-98.5 F (36.9 C)] 98.1 F (36.7 C) (03/30 0800) Pulse Rate:  [79] 79 (03/29 2344) Resp:  [20-35] 20 (03/30 0910) BP: (105-148)/(40-72) 130/44 mmHg (03/30 0900) SpO2:  [92 %-100 %] 97 % (03/30 0910) FiO2 (%):  [55 %-60 %] 55 % (03/30 0800) Weight:  [128 lb 15.5 oz (58.5 kg)] 128 lb 15.5 oz (58.5 kg) (03/30 0212)   INTAKE / OUTPUT:  Intake/Output Summary (Last 24 hours) at 03/09/15 0927 Last data filed at 03/09/15 0800  Gross per 24 hour  Intake    880 ml  Output   2135 ml  Net  -1255 ml   PHYSICAL EXAMINATION: General: elderly female in NAD Neuro: awake, follows simple commands, oriented to person, place HEENT: mm pink/moist, no jvd Cardiovascular: s1s2 rrr, no m/r/g  Lungs: even/non-labored, lungs bilaterally diminished  Abdomen:  Soft, non tender Musculoskeletal: no edema Skin: no rashes  LABS:  CBC  Recent Labs Lab 03/07/15 0330 03/08/15 0355 03/09/15 0330  WBC 8.5 10.0 12.5*   HGB 10.1* 10.0* 10.5*  HCT 30.8* 31.2* 33.0*  PLT 134* 121* 185   Coag's  Recent Labs Lab 03/03/15 0330  APTT 30  INR 1.30   BMET  Recent Labs Lab 03/08/15 0355 03/08/15 2022 03/09/15 0330  NA 134* 137 138  K 2.9* 3.4* 3.3*  CL 94* 95* 96  CO2 29 30 32  BUN 16 18 19   CREATININE 0.74 0.96 0.89  GLUCOSE 114* 114* 119*   Electrolytes  Recent Labs Lab 03/03/15 0330 03/04/15 0230  03/07/15 0330 03/08/15 0355 03/08/15 2022 03/09/15 0330  CALCIUM 8.1* 8.1*  < > 7.8* 7.7* 7.9* 8.0*  MG 1.8 2.2  < > 1.7 1.7  --  2.1  PHOS 3.0 2.4  --   --   --   --   --   < > = values in this interval not displayed.   ABG  Recent Labs Lab 03/05/15 2334  PHART 7.418  PCO2ART 35.6  PO2ART 79.6*   Glucose  Recent Labs Lab 03/02/15 1356  GLUCAP 104*   Imaging Ct Chest High Resolution  03/08/2015   CLINICAL DATA:  81 year old female with increasing shortness of breath. Abnormal chest x-ray.  EXAM: CT CHEST WITHOUT CONTRAST  TECHNIQUE: Multidetector CT imaging of the chest was performed following the standard protocol without intravenous contrast. High resolution imaging of the lungs, as well as inspiratory and expiratory imaging, was performed.  COMPARISON:  No prior chest CT.  Chest x-ray 03/08/2015.  FINDINGS: Mediastinum/Lymph Nodes: Heart size is normal. There is no significant pericardial fluid, thickening or pericardial calcification. There is atherosclerosis of the thoracic aorta, the great vessels of the mediastinum and the coronary arteries, including calcified atherosclerotic plaque in the left main, left anterior descending, left circumflex and right coronary arteries. No pathologically enlarged mediastinal or hilar lymph nodes. Please note that accurate exclusion of hilar adenopathy is limited on noncontrast CT scans. Fluid and debris throughout the esophagus. No axillary adenopathy.  Lungs/Pleura: Assessment of the lung parenchyma is significantly limited by a large amount  of patient respiratory motion. With these limitations in mind, there are patchy areas of ground-glass attenuation, most evident in the left upper lobe. This is associated with some septal thickening and focal architectural distortion. Complete atelectasis of the right lower lobe and right middle lobe. Subsegmental atelectasis in the dependent portions of the left lower lobe. Large right and small left pleural effusions layering dependently. Central airways are patent. High-resolution images are essentially nondiagnostic.  Upper Abdomen: The spleen is incompletely visualized but appears enlarged, measuring up to 14.6 x 5.1 cm on axial images.  Musculoskeletal/Soft Tissues: Multiple old healed right-sided rib fractures. There are no aggressive appearing lytic or blastic lesions noted in the visualized portions of the skeleton.  IMPRESSION: 1. Asymmetrically distributed ground-glass attenuation airspace disease and septal thickening, involving predominantly the left upper lobe, favored to reflect an acute pneumonia. 2. High-resolution images were very limited secondary to respiratory motion, but there are no definite imaging findings to strongly suggest interstitial lung disease. 3. Large right and small left pleural effusions layering dependently with complete passive atelectasis of the right lower and middle lobes, and mild subsegmental atelectasis of the dependent left lower lobe. 4. Atherosclerosis, including left main and 3 vessel coronary artery disease. 5. Splenomegaly.   Electronically Signed   By: Vinnie Langton M.D.   On: 03/08/2015 18:11   Dg Chest Port 1 View  03/08/2015   CLINICAL DATA:  Edema.  Shortness of breath.  EXAM: PORTABLE CHEST - 1 VIEW  COMPARISON:  03/07/2015.  FINDINGS: Mediastinum and hilar structures are stable. Heart size stable. Diffuse bilateral pulmonary infiltrates with bilateral pleural effusions remain. Right side greater than left. No change. No pneumothorax. No acute osseus  abnormality.  IMPRESSION: 1. Persistent bilateral pulmonary infiltrates/edema. 2. Persistent bilateral effusions, right side greater than left.   Electronically Signed   By: Marcello Moores  Register   On: 03/08/2015 07:12   ASSESSMENT / PLAN:  PULMONARY ETT 3/22 for Endo>> 3/24 A: Acute respiratory failure from pulmonary edema. Bilateral Infiltrates - unclear etiology, initially suspected to be pulmonary edema but no clearing with lasix, ? Pneumonitis vs infectious etiolgy.  Sed rate 24, BNP 567 Pleural Effusions - R>L, large on R P:   Oxygen to keep SpO2 > 92% PRN BiPAP F/u CXR Diurese as tolerated, lasix 40 Q12  Assess pleural space for diagnostic / therapeutic thoracentesis, plan for afternoon 3/30  CARDIOVASCULAR CVL None A:  New onset A fib with RVR 3/27 >> converted to sinus after dose of lopressor 3/27. Hx of HTN. P:  Lopressor, amiodarone per cardiology No anticoagulation in setting of GI bleed No ASA for 6 weeks after EGD  RENAL A: Hypokalemia. Hypomagnesemia. P:   Goal K > 4, goal Mg > 2 K BID with lasix Monitor BMP  GASTROINTESTINAL A:   Upper GI bleeding 2nd to Dieulafoy lesion. Dysphagia. P:   D1  diet, nectar thick liquids Continue Protonix  Will need f/u with GI as outpt  HEMATOLOGIC A:   Acute blood loss anemia 2nd to GI bleed. Thrombocytopenia from consumption related to bleeding. P:  F/u CBC Transfuse for Hb < 7 or bleeding SCD's for DVT prevention  INFECTIOUS A:  UTI. P:   Day 4/4 Cipro, d/c 3/29  Urine 3/26 >> E-Coli >> sens CIPRO Hold further abx, monitor fever curve / wbc  ENDOCRINE A:   No acute issue. P:   Monitor blood sugar on BMET  NEUROLOGIC A: Hx of dementia. P:   Monitor mental status PT as tolerated    Change to SDU status.  Thora this afternoon, possible back to floor in am 3/31.   Noe Gens, NP-C [facility] Pgr: [phone] or if no answer [phone]  03/09/2015, 9:27 AM

## 2015-03-09 NOTE — Progress Notes (Signed)
An hour after the bi-pap had been applied the patient complained that it was causing her pain and that she would not be able to sleep with it on. RN called e-link and spoke with MD. MD instructed RN to change patient back to venturi mask so patient would be comfortable and hopefully get some rest.

## 2015-03-09 NOTE — Procedures (Signed)
Thoracentesis Procedure Note  Pre-operative Diagnosis: Pleural Effusion   Post-operative Diagnosis: same  Indications: Therapeutic relief & diagnostic evaluation of pleural fluid   Procedure Details  Consent: Informed consent was obtained. Risks of the procedure were discussed including: infection, bleeding, pain, pneumothorax.  Under sterile conditions the patient was positioned. Betadine solution and sterile drapes were utilized.  1% plain lidocaine was used to anesthetize the 7th rib space. Fluid was obtained without any difficulties and minimal blood loss.  A dressing was applied to the wound and wound care instructions were provided.   Findings 1200 ml of clear yellow pleural fluid was obtained. A sample was sent to Pathology for cell counts as well as for infection analysis.  Complications:  None; patient tolerated the procedure well.          Condition: stable  Plan A follow up chest x-ray was ordered.  Procedure performed under direct supervision of Dr. Lamonte Sakai and with ultrasound guidance for real time vessel cannulation.     Noe Gens, NP-C [facility] Pgr: [phone] or [phone]

## 2015-03-09 NOTE — Progress Notes (Signed)
Pt refused to wear bipap tonight, stating it hurts her, and that she can't sleep with it on.  Pt appears comfortable in no apparent distress, HR66, rr19, spo2 100% on 3lnc.  RN aware.

## 2015-03-09 NOTE — Progress Notes (Signed)
Went in to ambulate patient at [place] with PT. Got pt into the standing position and pt converted her rhythm from NSR to A. Fib with RVR. Pt put back in bed, given her PO dose of Amioderone and MD made aware. Pt quickly went to A. Fib with rate control and within the hour she was back in NSR.

## 2015-03-09 NOTE — Progress Notes (Signed)
Speech Language Pathology Treatment: Dysphagia  Patient Details Name: Tiffany [name] MRN: [id number] DOB: August 14, 1929 Today's Date: 03/09/2015 Time: 7703-4035 SLP Time Calculation (min) (ACUTE ONLY): 40 min  Assessment / Plan / Recommendation Clinical Impression  Dysphagia treatment to determine tolerance of po diet, assess for dietary advancement and educate pt/family.  Pt doing well today, per family the best she has since admit.  Off ventimask and on nasal cannula oxygen.  Phonatory strength is better than upon initial evaluation.  Pt states she has no appetite but did eat applesauce today.    Dentures brushed by SLP and placed by pt - mildly loose but pt reports 20 pound weight loss and denies using adhesive on them.  SLP observed pt consuming nectar, thin, applesauce, crushed medicine given by RN and solid cracker.  Mildly decreased mastication ability - and pt benefited from verbal cue to assure she always chews thoroughly and clears mouth before speaking - required min cues.  No increased WOB or s/s of aspiration noted.  Pt reports swallow is currently at a level 79 of 10 being normal.  Spouse admits to pt having occasional issues with foods.    Recommend to advance diet to dys3/ground meat/thin with precautions. Obtained order, advanced diet per MD and provided pt/family with menu/phone to order meal.    SLP to follow up briefly to assure tolerance, education and assess for instrumental eval indication.    HPI HPI: 81 yo adm to [facility] with GI bleed.  Pt is s/p endoscopy and was placed on ventilator.  She is now extubated as of 930 am today and swallow evaluation was ordered.  Pt found to have right lobe infiltrate/layering right effusion per CXR.  Pt for thoracentesis, today is off ventimask and doing well.  Follow up to assess tolerance of po diet and readiness for advancement completed.    Pertinent Vitals Pain Assessment: No/denies pain  SLP Plan  Continue with current plan of care     Recommendations Diet recommendations: Dysphagia 3 (mechanical soft);Thin liquid Liquids provided via: Cup;Straw Medication Administration: Whole meds with puree Supervision: Full supervision/cueing for compensatory strategies;Patient able to self feed Compensations: Slow rate;Small sips/bites Postural Changes and/or Swallow Maneuvers: Seated upright 90 degrees;Upright 30-60 min after meal              Oral Care Recommendations: Oral care BID Follow up Recommendations: None Plan: Continue with current plan of care    [place], [place] [facility] SLP [phone]

## 2015-03-09 NOTE — Progress Notes (Signed)
3Physical Therapy Treatment Patient Details Name: Tiffany [name] MRN: [id number] DOB: December 11, 1928 Today's Date: 03/09/2015    History of Present Illness MILAH RECHT is a 81 y.o. female presenting with acute onset hematemesis on 3/22.Enoscopy performed 03/02/15 for upper GI bleed. required ventilator until 3/24.Marland Kitchen transferred to SDU  03/05/15 with AMS, respiratory distress,  afib/RVR.     PT Comments    Treatment limited due to increased HR, RN in room, checked rhythm and reported pt in Afib. Assisted back in to bed. Patient placed on  NRB  Mask per RN for activity.   Follow Up Recommendations  SNF;Supervision/Assistance - 24 hour     Equipment Recommendations  Rolling walker with 5" wheels    Recommendations for Other Services       Precautions / Restrictions Precautions Precautions: Fall Precaution Comments: monitor sats/HR, afib, on  Venturi 55% Restrictions Weight Bearing Restrictions: No    Mobility  Bed Mobility Overal bed mobility: Needs Assistance Bed Mobility: Supine to Sit     Supine to sit: Mod assist Sit to supine: Min assist   General bed mobility comments: assist to  get  to upright and back onto bed.  Transfers   Equipment used: Rolling walker (2 wheeled) Transfers: Sit to/from Stand Sit to Stand: Mod assist         General transfer comment: steady assist for rising from bed,   Ambulation/Gait Ambulation/Gait assistance: Mod assist           General Gait Details: only 4 side steps along bed as patient in afib/rvr per RN. HR up in 150-160 briefly. after return to supine, back in sinus, HR 90.   Stairs            Wheelchair Mobility    Modified Rankin (Stroke Patients Only)       Balance   Sitting-balance support: Bilateral upper extremity supported;Feet supported Sitting balance-Leahy Scale: Fair                              Cognition Arousal/Alertness: Awake/alert Behavior During Therapy: WFL for tasks  assessed/performed Overall Cognitive Status: Within Functional Limits for tasks assessed                      Exercises      General Comments        Pertinent Vitals/Pain Pain Assessment: No/denies pain    Home Living                      Prior Function            PT Goals (current goals can now be found in the care plan section) Progress towards PT goals: Progressing toward goals    Frequency  Min 3X/week    PT Plan Current plan remains appropriate    Co-evaluation             End of Session Equipment Utilized During Treatment: Oxygen Activity Tolerance: Treatment limited secondary to medical complications (Comment) (incr. HR) Patient left: in bed;with call bell/phone within reach;with family/visitor present;with nursing/sitter in room     Time: 0820-0855 PT Time Calculation (min) (ACUTE ONLY): 35 min  Charges:  $Therapeutic Activity: 23-37 mins                    G Codes:      Tiffany [name] 03/09/2015, 9:15 AM [name] PT [phone]

## 2015-03-10 ENCOUNTER — Inpatient Hospital Stay (HOSPITAL_COMMUNITY): Payer: Commercial Managed Care - HMO

## 2015-03-10 DIAGNOSIS — J810 Acute pulmonary edema: Secondary | ICD-10-CM

## 2015-03-10 DIAGNOSIS — R0600 Dyspnea, unspecified: Secondary | ICD-10-CM

## 2015-03-10 DIAGNOSIS — J90 Pleural effusion, not elsewhere classified: Secondary | ICD-10-CM

## 2015-03-10 LAB — CBC
HCT: 32.1 % — ABNORMAL LOW (ref 36.0–46.0)
Hemoglobin: 10.2 g/dL — ABNORMAL LOW (ref 12.0–15.0)
MCH: 28.9 pg (ref 26.0–34.0)
MCHC: 31.8 g/dL (ref 30.0–36.0)
MCV: 90.9 fL (ref 78.0–100.0)
Platelets: 183 K/uL (ref 150–400)
RBC: 3.53 MIL/uL — ABNORMAL LOW (ref 3.87–5.11)
RDW: 15.1 % (ref 11.5–15.5)
WBC: 11.4 K/uL — ABNORMAL HIGH (ref 4.0–10.5)

## 2015-03-10 LAB — BASIC METABOLIC PANEL
ANION GAP: 9 (ref 5–15)
BUN: 19 mg/dL (ref 6–23)
CALCIUM: 8 mg/dL — AB (ref 8.4–10.5)
CHLORIDE: 97 mmol/L (ref 96–112)
CO2: 31 mmol/L (ref 19–32)
Creatinine, Ser: 0.96 mg/dL (ref 0.50–1.10)
GFR calc non Af Amer: 53 mL/min — ABNORMAL LOW (ref >90)
GFR, EST AFRICAN AMERICAN: 62 mL/min — AB (ref >90)
Glucose, Bld: 114 mg/dL — ABNORMAL HIGH (ref 70–99)
Potassium: 4 mmol/L (ref 3.5–5.1)
Sodium: 137 mmol/L (ref 135–145)

## 2015-03-10 MED ORDER — FUROSEMIDE 10 MG/ML IJ SOLN: 40.0000 mg | Freq: Every day | INTRAMUSCULAR | Status: DC

## 2015-03-10 MED ORDER — METOPROLOL SUCCINATE ER 25 MG PO TB24
12.5000 mg | ORAL_TABLET | Freq: Every day | ORAL | Status: DC
Start: 2015-03-10 — End: 2015-03-12
  Administered 2015-03-11 – 2015-03-12 (×2): 12.5 mg via ORAL
  Filled 2015-03-10 (×2): qty 1

## 2015-03-10 MED ORDER — POTASSIUM CHLORIDE CRYS ER 20 MEQ PO TBCR
40.0000 meq | EXTENDED_RELEASE_TABLET | Freq: Every day | ORAL | Status: DC
  Administered 2015-03-10 – 2015-03-12 (×3): 40 meq via ORAL
  Filled 2015-03-10 (×3): qty 2

## 2015-03-10 NOTE — Progress Notes (Signed)
Speech Language Pathology Treatment: Dysphagia  Patient Details Name: Tiffany [name] MRN: [id number] DOB: 11/06/1929 Today's Date: 03/10/2015 Time: 1002-1034 SLP Time Calculation (min) (ACUTE ONLY): 32 min  Assessment / Plan / Recommendation Clinical Impression  Pt reports great intake of breakfast with good tolerance.  She continues to admit to some difficulties with solids due to her dentures/missing dentition.  Her friend reports pt is being cautious with intake and tolerating well without coughing or choking.   SLP observed pt consuming water and medicine with applesauce (potassium) given by RN.  Even with potassium being cut in half, pt reported sensation of lodging in pharynx and presented with delayed cough. Further bolus of applesauce aided clearance per pt.     Pt then admitted to vitamin pill lodging in throat (x2 two occasions in the last six months) requiring her to expectorate.  Advised pt to seek chewable vitamin and inquire to pharmacist if large pills may be crushed or cut to decrease aspiration risk.    Recommend to continue Dys3/ground meat/thin diet given pt premorbid dysphagia and poor dentition.  Pt agreeable to plan.  Using teach back, educated pt and her friend in room.  SLP to sign off as all education is completed.     HPI HPI: 81 yo adm to [facility] with GI bleed.  Pt is s/p endoscopy and was placed on ventilator.  She is now extubated as of 930 am today and swallow evaluation was ordered.  Pt found to have right lobe infiltrate/layering right effusion per CXR.  Pt for thoracentesis, off ventimask x2 days and doing well.  Follow up to assess tolerance of po diet, pt/family education and readiness for advancement completed.    Pertinent Vitals    SLP Plan  All goals met    Recommendations Diet recommendations: Dysphagia 3 (mechanical soft);Thin liquid Liquids provided via: Cup;Straw Medication Administration: Whole meds with puree (crush if large and not  contraindicated) Supervision: Full supervision/cueing for compensatory strategies;Patient able to self feed Compensations: Slow rate;Small sips/bites Postural Changes and/or Swallow Maneuvers: Seated upright 90 degrees;Upright 30-60 min after meal              Oral Care Recommendations: Oral care BID Follow up Recommendations: None Plan: All goals met    GO      , MS CCC SLP [phone]    

## 2015-03-10 NOTE — Progress Notes (Signed)
PULMONARY / CRITICAL CARE MEDICINE   Name: Tiffany [name] MRN: [id number] DOB: Feb 06, 1929    ADMISSION DATE:  03/01/2015  REFERRING MD : Paulita Fujita  CHIEF COMPLAINT:  Hematemesis   INITIAL PRESENTATION:  81 y/o female presented with hematemesis.  She required intubation during endoscopy.    STUDIES:  3/23 EGD >> Dieulafoy lesion proximal stomach s/p hemoclips  SIGNIFICANT EVENTS: 3/22 Admit, intubated 3/24 Extubated 3/25 Speech assessment >> D3 diet 3/26 To ICU due to change in mental status 3/27 Refractory a fib, PRBC 1 unit for Hb 7.2, cardiology consulted 3/28 Worsening resp status / confusion.  BiPAP + Precedex  3/29 Off bipap & precedex, NRB  3/29 CT Chest >> GGO in LUL, Large R, small L pleural effusions with passive atelectasis, 3 vessel CAD, spleenomegaly 3/30 Weaned to 4L, Afib w RVR with standing  3/30 thora with 1200 cc yellow fluid  SUBJECTIVE:  Comfortable post thorab  VITAL SIGNS: Temp:  [97.3 F (36.3 C)-99.3 F (37.4 C)] 98.5 F (36.9 C) (03/31 0400) Resp:  [15-27] 22 (03/31 0600) BP: (87-149)/(27-79) 102/41 mmHg (03/31 0600) SpO2:  [91 %-100 %] 98 % (03/31 0700) FiO2 (%):  [55 %] 55 % (03/30 0800) Weight:  [135 lb 5.8 oz (61.4 kg)] 135 lb 5.8 oz (61.4 kg) (03/31 0126)   INTAKE / OUTPUT:  Intake/Output Summary (Last 24 hours) at 03/10/15 0738 Last data filed at 03/10/15 0600  Gross per 24 hour  Intake    870 ml  Output   1875 ml  Net  -1005 ml   PHYSICAL EXAMINATION: General: elderly female in NAD Neuro: awake, follows simple commands, oriented to person, place, smiling HEENT: mm pink/moist, no jvd Cardiovascular: s1s2 rrr, no m/r/g  Lungs: even/non-labored, lungs bilaterally diminished , no distress Abdomen:  Soft, non tender Musculoskeletal: no edema Skin: no rashes  LABS:  CBC  Recent Labs Lab 03/08/15 0355 03/09/15 0330 03/10/15 0330  WBC 10.0 12.5* 11.4*  HGB 10.0* 10.5* 10.2*  HCT 31.2* 33.0* 32.1*  PLT 121* 185 183    Coag's No results for input(s): APTT, INR in the last 168 hours. BMET  Recent Labs Lab 03/08/15 2022 03/09/15 0330 03/10/15 0330  NA 137 138 137  K 3.4* 3.3* 4.0  CL 95* 96 97  CO2 30 32 31  BUN 18 19 19   CREATININE 0.96 0.89 0.96  GLUCOSE 114* 119* 114*   Electrolytes  Recent Labs Lab 03/04/15 0230  03/07/15 0330 03/08/15 0355 03/08/15 2022 03/09/15 0330 03/10/15 0330  CALCIUM 8.1*  < > 7.8* 7.7* 7.9* 8.0* 8.0*  MG 2.2  < > 1.7 1.7  --  2.1  --   PHOS 2.4  --   --   --   --   --   --   < > = values in this interval not displayed.   ABG  Recent Labs Lab 03/05/15 2334  PHART 7.418  PCO2ART 35.6  PO2ART 79.6*   Glucose No results for input(s): GLUCAP in the last 168 hours. Imaging Dg Chest Port 1 View  03/09/2015   CLINICAL DATA:  Status post right thoracentesis.  EXAM: PORTABLE CHEST - 1 VIEW  COMPARISON:  03/09/2015 at 4:56 a.m.  FINDINGS: Diminished right pleural effusion, now small to moderate, with basilar atelectasis or pneumonia. No re-expansion edema or definite pneumothorax. Multiple remote right-sided rib fractures noted. The left lung is better aerated compared to earlier study. Normal heart size and aortic contours.  IMPRESSION: No adverse findings after right thoracentesis.  Electronically Signed   By: Monte Fantasia M.D.   On: 03/09/2015 14:52   Dg Chest Port 1 View  03/09/2015   CLINICAL DATA:  Acute respiratory failure  EXAM: PORTABLE CHEST - 1 VIEW  COMPARISON:  03/08/2015  FINDINGS: Moderate to large right pleural effusion is unchanged. Right lower lobe atelectasis unchanged due to effusion.  Left upper lobe airspace disease is slightly improved. Small left effusion unchanged.  IMPRESSION: Moderate to large right effusion and right lower lobe atelectasis unchanged  Left upper lobe infiltrate shows mild interval improvement. Small left effusion unchanged.   Electronically Signed   By: Franchot Gallo M.D.   On: 03/09/2015 07:17   ASSESSMENT /  PLAN:  PULMONARY ETT 3/22 for Endo>> 3/24 A: Acute respiratory failure from pulmonary edema. Bilateral Infiltrates - unclear etiology, initially suspected to be pulmonary edema but no clearing with lasix, ? Pneumonitis vs infectious etiolgy.  Sed rate 24, BNP 567 Pleural Effusions - R>L, large on R P:   Oxygen to keep SpO2 > 92% PRN BiPAP F/u CXR Diurese as tolerated, lasix 40 Q12  Post thora for 1200 cc  CARDIOVASCULAR CVL None A:  New onset A fib with RVR 3/27 >> converted to sinus after dose of lopressor 3/27. Hx of HTN. P:  Lopressor, amiodarone per cardiology No anticoagulation in setting of GI bleed No ASA for 6 weeks after EGD  RENAL  Recent Labs Lab 03/08/15 2022 03/09/15 0330 03/10/15 0330  K 3.4* 3.3* 4.0     A: Hypokalemia. Hypomagnesemia. P:   Goal K > 4, goal Mg > 2 K BID with lasix Monitor BMP  GASTROINTESTINAL A:   Upper GI bleeding 2nd to Dieulafoy lesion. Dysphagia. P:   D1 diet, nectar thick liquids Continue Protonix  Will need f/u with GI as outpt  HEMATOLOGIC  Recent Labs  03/09/15 0330 03/10/15 0330  HGB 10.5* 10.2*    A:   Acute blood loss anemia 2nd to GI bleed. Thrombocytopenia from consumption related to bleeding. P:  F/u CBC Transfuse for Hb < 7 or bleeding SCD's for DVT prevention  INFECTIOUS A:  UTI. P:   Day 4/4 Cipro, d/c 3/29  Urine 3/26 >> E-Coli >> sens CIPRO Hold further abx, monitor fever curve / wbc  ENDOCRINE A:   No acute issue. P:   Monitor blood sugar on BMET  NEUROLOGIC A: Hx of dementia. P:   Monitor mental status PT as tolerated    Post thora 3/30 1200 cc of yellow fluid. Looks great. Consider transfer to floor and to triad service for 4/1  [facility] ACNP Maryanna Shape PCCM Pager [phone] till 3 pm If no answer page [phone] 03/10/2015, 7:41 AM

## 2015-03-10 NOTE — Progress Notes (Signed)
    Subjective:  Denies CP or dyspnea   Objective:  Filed Vitals:   03/10/15 0500 03/10/15 0600 03/10/15 0700 03/10/15 0800  BP: 93/36 102/41 103/46 114/59  Pulse:      Temp:      TempSrc:      Resp: 19 22 16 22   Height:      Weight:      SpO2: 100% 93% 98% 97%    Intake/Output from previous day:  Intake/Output Summary (Last 24 hours) at 03/10/15 0838 Last data filed at 03/10/15 0800  Gross per 24 hour  Intake    900 ml  Output   1830 ml  Net   -930 ml    Physical Exam: Physical exam: Well-developed frail in no acute distress.  Skin is warm and dry.  HEENT is normal.  Neck is supple.  Chest with diminished BS bases Cardiovascular exam is regular rate and rhythm.  Abdominal exam nontender or distended. No masses palpated. Extremities show no edema. neuro grossly intact    Lab Results: Basic Metabolic Panel:  Recent Labs  03/08/15 0355  03/09/15 0330 03/10/15 0330  NA 134*  < > 138 137  K 2.9*  < > 3.3* 4.0  CL 94*  < > 96 97  CO2 29  < > 32 31  GLUCOSE 114*  < > 119* 114*  BUN 16  < > 19 19  CREATININE 0.74  < > 0.89 0.96  CALCIUM 7.7*  < > 8.0* 8.0*  MG 1.7  --  2.1  --   < > = values in this interval not displayed. CBC:  Recent Labs  03/09/15 0330 03/10/15 0330  WBC 12.5* 11.4*  HGB 10.5* 10.2*  HCT 33.0* 32.1*  MCV 90.4 90.9  PLT 185 183    Assessment/Plan:  1 paroxysmal atrial fibrillation-the patient remains in sinus rhythm this AM. Continue amiodarone 400 mg by mouth twice a day. Decrease to 200 mg daily at discharge. Would continue for approximately 8 weeks and if she maintains sinus rhythm discontinue at that point. Continue toprol but decrease to 12.5 daily. Atrial fibrillation most likely driven by recent bleed and infection. Would not anticoagulate at this point given recent GI bleed. 2 recent GI bleed-hemoglobin stable this morning. 3 acute combined systolic/diastolic congestive heart failure-echocardiogram shows ejection  fraction 40-45%. Continue diuresis. Dyspnea improved following thoracentesis. 4 Hypokalemia-improved  Kirk Ruths 03/10/2015, 8:38 AM

## 2015-03-10 NOTE — Progress Notes (Signed)
Clinical Social Work  CSW met with pt and family at bedside to discuss SNF placement at [place]. Daughter, [name] states that they are betwwen pt oing to SNF or home with home health. Daughter also states that they would like an updated assessment from PT/OT before making a decision. Daughter states that they are between [facility] and [place] [facility]. CSW is awaiting bed offer from [facility]. CSW contacted [facility] who is not in network with pt's insurance. CSW will continue to follow.  Glorious Peach BSW Intern

## 2015-03-10 NOTE — Progress Notes (Signed)
Pt currently on 3LPM [place] sats 94%. No distress noted at this time.

## 2015-03-10 NOTE — Consult Note (Signed)
   [facility] CM Inpatient Consult   03/10/2015  Tiffany [name] 06-26-1929 [id number]  Came to visit patient at bedside as a benefit of her [employer] insurance to discuss [place]  Management services. Patient expresses interest. However, she wants her daughter, who is a Marine scientist, to look over the packet prior to consenting. Left [facility] Care Management packet at bedside along with contact information. Will follow up at later time. Will make inpatient RNCM aware.  Marthenia Rolling, MSN-Ed, RN,BSN [facility] Liaison [phone]

## 2015-03-11 LAB — BASIC METABOLIC PANEL
ANION GAP: 7 (ref 5–15)
BUN: 22 mg/dL (ref 6–23)
CALCIUM: 8.2 mg/dL — AB (ref 8.4–10.5)
CO2: 31 mmol/L (ref 19–32)
Chloride: 100 mmol/L (ref 96–112)
Creatinine, Ser: 1.13 mg/dL — ABNORMAL HIGH (ref 0.50–1.10)
GFR calc Af Amer: 51 mL/min — ABNORMAL LOW (ref >90)
GFR calc non Af Amer: 44 mL/min — ABNORMAL LOW (ref >90)
Glucose, Bld: 102 mg/dL — ABNORMAL HIGH (ref 70–99)
Potassium: 4 mmol/L (ref 3.5–5.1)
SODIUM: 138 mmol/L (ref 135–145)

## 2015-03-11 LAB — CBC
HCT: 31.5 % — ABNORMAL LOW (ref 36.0–46.0)
Hemoglobin: 9.8 g/dL — ABNORMAL LOW (ref 12.0–15.0)
MCH: 28.6 pg (ref 26.0–34.0)
MCHC: 31.1 g/dL (ref 30.0–36.0)
MCV: 91.8 fL (ref 78.0–100.0)
PLATELETS: 166 10*3/uL (ref 150–400)
RBC: 3.43 MIL/uL — AB (ref 3.87–5.11)
RDW: 14.8 % (ref 11.5–15.5)
WBC: 9.3 10*3/uL (ref 4.0–10.5)

## 2015-03-11 MED ORDER — FUROSEMIDE 40 MG PO TABS
40.0000 mg | ORAL_TABLET | Freq: Every day | ORAL | Status: DC
  Administered 2015-03-11 – 2015-03-12 (×2): 40 mg via ORAL
  Filled 2015-03-11 (×2): qty 1

## 2015-03-11 NOTE — Consult Note (Signed)
Came to bedside to speak with patient about [place] Management services again and to see if she has discussed these services with her daughter. Patient states she still has not made a decision. States she will call if she wants to be followed by [place] [facility]. Reports she still has the contact information and [facility] packet. Noted patient Tiffany [name] may go to SNF at discharge.  Marthenia Rolling, MSN-Ed, RN,BSN [facility] Liaison [phone]

## 2015-03-11 NOTE — Progress Notes (Signed)
PULMONARY / CRITICAL CARE MEDICINE   Name: Tiffany [name] MRN: [id number] DOB: August 15, 1929  LOS 10 days    ADMISSION DATE:  03/01/2015  REFERRING MD : Paulita Fujita  CHIEF COMPLAINT:  Hematemesis   INITIAL PRESENTATION:  81 y/o female presented with hematemesis.  She required intubation during endoscopy.    STUDIES:  3/23 EGD >> Dieulafoy lesion proximal stomach s/p hemoclips  SIGNIFICANT EVENTS: 3/22 Admit, intubated 3/24 Extubated 3/25 Speech assessment >> D3 diet 3/26 To ICU due to change in mental status 3/27 Refractory a fib, PRBC 1 unit for Hb 7.2, cardiology consulted 3/28 Worsening resp status / confusion.  BiPAP + Precedex  3/29 Off bipap & precedex, NRB  3/29 CT Chest >> GGO in LUL, Large R, small L pleural effusions with passive atelectasis, 3 vessel CAD, spleenomegaly 3/30 Weaned to 4L, Afib w RVR with standing  3/30 thora with 1200 cc yellow fluid 0- TRANSUDATE , CYTOLOGY - NON DIAGNOSTIC 03/10/15 :  Comfortable post thorab. Moved to floor   SUBJECTIVE/OVERNIGHT/INTERVAL HX 03/11/15: Improving. More conditioned. Sitting in chair.Still on o2. Jennet Maduro go go home  VITAL SIGNS: Temp:  [97.8 F (36.6 C)-98.2 F (36.8 C)] 98.2 F (36.8 C) (04/01 0521) Pulse Rate:  [62-73] 62 (04/01 0521) Resp:  [17-18] 17 (04/01 0521) BP: (109-128)/(39-51) 128/51 mmHg (04/01 0521) SpO2:  [97 %-99 %] 99 % (04/01 0521) Weight:  [56.654 kg (124 lb 14.4 oz)] 56.654 kg (124 lb 14.4 oz) (04/01 0400)   INTAKE / OUTPUT:  Intake/Output Summary (Last 24 hours) at 03/11/15 1246 Last data filed at 03/11/15 1108  Gross per 24 hour  Intake   1020 ml  Output    905 ml  Net    115 ml   PHYSICAL EXAMINATION: General: elderly female in NAD. Deconditioned but better Neuro: awake, follows simple commands, oriented to person, place, smiling HEENT: mm pink/moist, no jvd Cardiovascular: s1s2 rrr, no m/r/g  Lungs: even/non-labored, lungs bilaterally diminished , no distress Abdomen:  Soft, non  tender Musculoskeletal: no edema Skin: no rashes  LABS:  CBC  Recent Labs Lab 03/09/15 0330 03/10/15 0330 03/11/15 0455  WBC 12.5* 11.4* 9.3  HGB 10.5* 10.2* 9.8*  HCT 33.0* 32.1* 31.5*  PLT 185 183 166   Coag's No results for input(s): APTT, INR in the last 168 hours. BMET  Recent Labs Lab 03/09/15 0330 03/10/15 0330 03/11/15 0455  NA 138 137 138  K 3.3* 4.0 4.0  CL 96 97 100  CO2 32 31 31  BUN 19 19 22   CREATININE 0.89 0.96 1.13*  GLUCOSE 119* 114* 102*   Electrolytes  Recent Labs Lab 03/07/15 0330 03/08/15 0355  03/09/15 0330 03/10/15 0330 03/11/15 0455  CALCIUM 7.8* 7.7*  < > 8.0* 8.0* 8.2*  MG 1.7 1.7  --  2.1  --   --   < > = values in this interval not displayed.   ABG  Recent Labs Lab 03/05/15 2334  PHART 7.418  PCO2ART 35.6  PO2ART 79.6*   Glucose No results for input(s): GLUCAP in the last 168 hours. Imaging Dg Chest Port 1 View  03/10/2015   CLINICAL DATA:  Pleural effusion.  EXAM: PORTABLE CHEST - 1 VIEW  COMPARISON:  03/09/2015.  03/09/2015.  CT 03/08/2015.  FINDINGS: Mediastinum and hilar structures normal. Stable cardiomegaly. Stable diffuse pulmonary interstitial prominence. Bibasilar atelectasis. Mild left lower lobe infiltrate cannot be excluded on today's exam. Continued improvement of right pleural effusion. Small left pleural effusion. No pneumothorax. Old right rib  fractures noted.  IMPRESSION: 1. Continued improvement right pleural effusion. 2. Cardiomegaly with persistent mild pulmonary interstitial prominence. Mild component congestive heart failure may be present. 3. Bibasilar atelectasis. Left lower lobe infiltrate cannot be excluded on today's exam . Small left pleural effusion cannot be excluded .   Electronically Signed   By: Marcello Moores  Register   On: 03/10/2015 07:35   ASSESSMENT / PLAN:  PULMONARY ETT 3/22 for Endo>> 3/24 A: Acute respiratory failure from pulmonary edema. Bilateral Infiltrates - unclear etiology,  initially suspected to be pulmonary edema but no clearing with lasix, ? Pneumonitis vs infectious etiolgy.  Sed rate 24, BNP 567.  Pleural Effusions - R>L, large on R s/p thora 1200cc 03/09/15   - down to nasal cannula o2  P:   Oxygen to keep SpO2 > 92%; RN to reassess with forehead probe - might need home o2 Dc  BiPAP F/u CXR prbn Diurese as tolerated, per cards   CARDIOVASCULAR CVL None A:  New onset A fib with RVR 3/27 >> converted to sinus after dose of lopressor 3/27. Hx of HTN.   - acute systolic chf - new dx this admit P:  Lopressor, amiodarone per cardiology No anticoagulation in setting of GI bleed No ASA for 6 weeks after EGD  RENAL  A: Hypokalemia. - resolved Hypomagnesemia. - resolved P:   Recheck bmet 03/12/15  Goal K > 4, goal Mg > 2 K BID with lasix Monitor BMP  GASTROINTESTINAL A:   Upper GI bleeding 2nd to Dieulafoy lesion. Dysphagia.  - eating regular food now per daughter . Imrpoved from d1 diet  P:   Diet per speech Continue Protonix  Will need f/u with GI as outpt  HEMATOLOGIC  Recent Labs  03/10/15 0330 03/11/15 0455  HGB 10.2* 9.8*    A:   Acute blood loss anemia 2nd to GI bleed. Thrombocytopenia from consumption related to bleeding.   - no active bleeding P:  F/u CBC Transfuse for Hb < 7 or bleeding SCD's for DVT prevention  INFECTIOUS A:  UTI. P:   Day 4/4 Cipro, d/c 3/29  Urine 3/26 >> E-Coli >> sens CIPRO Hold further abx, monitor fever curve / wbc  ENDOCRINE A:   No acute issue. P:   Monitor blood sugar on BMET  NEUROLOGIC A: Hx of dementia. P:   Monitor mental status PT as tolerated     Keep in floor. To [facility] service 03/12/15 and PCCM off.   Daughter Nikki updated    Dr. Brand Males, M.D., [facility].C.P Pulmonary and Critical Care Medicine Staff Physician [facility] Pager: [phone], If no answer or between  15:00h - 7:00h: call [phone]  03/11/2015 12:52 PM

## 2015-03-11 NOTE — Progress Notes (Signed)
Physical Therapy Treatment Patient Details Name: Tiffany [name] MRN: [id number] DOB: 1929-10-29 Today's Date: 03/11/2015    History of Present Illness Tiffany [name] is a 81 y.o. female presenting with acute onset hematemesis on 3/22.Enoscopy performed 03/02/15 for upper GI bleed. required ventilator until 3/24.Marland Kitchen transferred to SDU  03/05/15 with AMS, respiratory distress,  afib/RVR.     PT Comments    Pt tolerated good distance ambulation today however reports first time actually being able to be out of room walking.  Discussed continued recommendation for SNF due to pt typically not using assistive device, currently on oxygen, and may need a little more time to recover and regain strength prior to d/c home to run household (lives with spouse however he was in room visiting pt and using w/c).   Follow Up Recommendations  SNF;Supervision/Assistance - 24 hour     Equipment Recommendations  Rolling walker with 5" wheels    Recommendations for Other Services       Precautions / Restrictions Precautions Precautions: Fall Precaution Comments: monitor sats/HR Restrictions Weight Bearing Restrictions: No    Mobility  Bed Mobility               General bed mobility comments: pt up in recliner on arrival  Transfers Overall transfer level: Needs assistance Equipment used: Rolling walker (2 wheeled) Transfers: Sit to/from Stand Sit to Stand: Min assist         General transfer comment: slight assist to steady, verbal cues for hand placement however pt maintained hands on RW despite cues  Ambulation/Gait Ambulation/Gait assistance: Min guard Ambulation Distance (Feet): 350 Feet Assistive device: Rolling walker (2 wheeled) Gait Pattern/deviations: Step-through pattern Gait velocity: decr   General Gait Details: slow but steady gait, pt very glad to be ambulating, no symptoms reported, HR remained WNL and SpO2 difficult to read due to poor perfusion however read 96%  briefly towards end of ambulation, pt ambulated on 3L O2   Stairs            Wheelchair Mobility    Modified Rankin (Stroke Patients Only)       Balance                                    Cognition Arousal/Alertness: Awake/alert Behavior During Therapy: WFL for tasks assessed/performed Overall Cognitive Status: Within Functional Limits for tasks assessed                      Exercises      General Comments        Pertinent Vitals/Pain Pain Assessment: No/denies pain    Home Living                      Prior Function            PT Goals (current goals can now be found in the care plan section) Progress towards PT goals: Progressing toward goals    Frequency  Min 3X/week    PT Plan Current plan remains appropriate    Co-evaluation             End of Session Equipment Utilized During Treatment: Oxygen Activity Tolerance: Patient tolerated treatment well Patient left: with call bell/phone within reach;with family/visitor present;in chair     Time: 8250-5397 PT Time Calculation (min) (ACUTE ONLY): 28 min  Charges:  $Gait Training: 8-22 mins  G Codes:      Karlton Maya,KATHrine E 07-Apr-2015, 1:16 PM Carmelia Bake, PT, DPT Apr 07, 2015 Pager: [phone]

## 2015-03-11 NOTE — Progress Notes (Signed)
CSW spoke with patient & grandaughter at bedside & provided SNF bed offers. Patient states that she has an 79 year old husband who will be home alone & is concerned about leaving him alone if she goes to rehab. Patient states that if she were to go to SNF that she would prefer [facility]. Ivin Booty at [facility] aware.   If patient is ready over the weekend, please contact weekend CSW, Rollene Fare (ph#: [phone]) to faciliate discharge.   Clinical Social Work Department CLINICAL SOCIAL WORK PLACEMENT NOTE 03/11/2015  Patient:  BRANDOLYN, SHORTRIDGE  Account Number:  [id number] Admit date:  03/01/2015  Clinical Social Worker:  Dede Query, CLINICAL SOCIAL WORKER  Date/time:  03/05/2015 04:18 PM  Clinical Social Work is seeking post-discharge placement for this patient at the following level of care:   SKILLED NURSING   (*CSW will update this form in Epic as items are completed)   03/05/2015  Patient/family provided with [facility] Department of Clinical Social Work's list of facilities offering this level of care within the geographic area requested by the patient (or if unable, by the patient's family).  03/05/2015  Patient/family informed of their freedom to choose among providers that offer the needed level of care, that participate in Medicare, Medicaid or managed care program needed by the patient, have an available bed and are willing to accept the patient.  03/05/2015  Patient/family informed of [facility]' ownership interest in [facility], as well as of the fact that they are under no obligation to receive care at this facility.  PASARR submitted to EDS on 03/05/2015 PASARR number received on 03/05/2015  FL2 transmitted to all facilities in geographic area requested by pt/family on  03/05/2015 FL2 transmitted to all facilities within larger geographic area on   Patient informed that his/her managed care company has contracts with or will negotiate with  certain facilities,  including the following:     Patient/family informed of bed offers received:  03/11/2015 Patient chooses bed at [place] Physician recommends and patient chooses bed at    Patient to be transferred to [place] on   Patient to be transferred to facility by  Patient and family notified of transfer on  Name of family member notified:    The following physician request were entered in Epic:   Additional Comments:    Raynaldo Opitz, [place] Social Worker cell #: [phone]

## 2015-03-11 NOTE — Progress Notes (Signed)
    Subjective:  Denies CP or dyspnea   Objective:  Filed Vitals:   03/10/15 1302 03/10/15 2048 03/11/15 0400 03/11/15 0521  BP: 117/39 109/40  128/51  Pulse: 71 73  62  Temp: 97.8 F (36.6 C) 98.1 F (36.7 C)  98.2 F (36.8 C)  TempSrc: Oral Oral  Oral  Resp: 18 18  17   Height: 5\' 4"  (1.626 m)     Weight:   124 lb 14.4 oz (56.654 kg)   SpO2: 97% 98%  99%    Intake/Output from previous day:  Intake/Output Summary (Last 24 hours) at 03/11/15 0745 Last data filed at 03/11/15 0711  Gross per 24 hour  Intake   1530 ml  Output    830 ml  Net    700 ml    Physical Exam: Physical exam: Well-developed frail in no acute distress.  Skin is warm and dry.  HEENT is normal.  Neck is supple.  Chest with diminished BS bases Cardiovascular exam is regular rate and rhythm.  Abdominal exam nontender or distended. No masses palpated. Extremities show no edema. neuro grossly intact    Lab Results: Basic Metabolic Panel:  Recent Labs  03/09/15 0330 03/10/15 0330 03/11/15 0455  NA 138 137 138  K 3.3* 4.0 4.0  CL 96 97 100  CO2 32 31 31  GLUCOSE 119* 114* 102*  BUN 19 19 22   CREATININE 0.89 0.96 1.13*  CALCIUM 8.0* 8.0* 8.2*  MG 2.1  --   --    CBC:  Recent Labs  03/10/15 0330 03/11/15 0455  WBC 11.4* 9.3  HGB 10.2* 9.8*  HCT 32.1* 31.5*  MCV 90.9 91.8  PLT 183 166    Assessment/Plan:  1 paroxysmal atrial fibrillation-the patient remains in sinus rhythm this AM. Continue amiodarone 400 mg by mouth twice a day. Decrease to 200 mg daily at discharge. Would continue for approximately 8 weeks and if she maintains sinus rhythm discontinue at that point. Continue toprol. Atrial fibrillation most likely driven by recent bleed and infection. Would not anticoagulate at this point given recent GI bleed. 2 recent GI bleed-hemoglobin stable this morning. 3 acute combined systolic/diastolic congestive heart failure-improving; echocardiogram shows ejection fraction 40-45%.  Change lasix to 40 mg daily. Dyspnea improved following thoracentesis. 4 Hypokalemia-improved  Kirk Ruths 03/11/2015, 7:45 AM

## 2015-03-12 DIAGNOSIS — J849 Interstitial pulmonary disease, unspecified: Secondary | ICD-10-CM | POA: Insufficient documentation

## 2015-03-12 DIAGNOSIS — E876 Hypokalemia: Secondary | ICD-10-CM

## 2015-03-12 DIAGNOSIS — I5021 Acute systolic (congestive) heart failure: Secondary | ICD-10-CM

## 2015-03-12 LAB — CBC
HCT: 31 % — ABNORMAL LOW (ref 36.0–46.0)
Hemoglobin: 9.6 g/dL — ABNORMAL LOW (ref 12.0–15.0)
MCH: 28.8 pg (ref 26.0–34.0)
MCHC: 31 g/dL (ref 30.0–36.0)
MCV: 93.1 fL (ref 78.0–100.0)
Platelets: 160 10*3/uL (ref 150–400)
RBC: 3.33 MIL/uL — ABNORMAL LOW (ref 3.87–5.11)
RDW: 14.8 % (ref 11.5–15.5)
WBC: 9.1 10*3/uL (ref 4.0–10.5)

## 2015-03-12 LAB — BASIC METABOLIC PANEL
Anion gap: 7 (ref 5–15)
BUN: 21 mg/dL (ref 6–23)
CHLORIDE: 102 mmol/L (ref 96–112)
CO2: 32 mmol/L (ref 19–32)
Calcium: 8.2 mg/dL — ABNORMAL LOW (ref 8.4–10.5)
Creatinine, Ser: 1.21 mg/dL — ABNORMAL HIGH (ref 0.50–1.10)
GFR calc Af Amer: 47 mL/min — ABNORMAL LOW (ref >90)
GFR calc non Af Amer: 40 mL/min — ABNORMAL LOW (ref >90)
Glucose, Bld: 95 mg/dL (ref 70–99)
Potassium: 4.1 mmol/L (ref 3.5–5.1)
Sodium: 141 mmol/L (ref 135–145)

## 2015-03-12 LAB — MAGNESIUM: Magnesium: 2 mg/dL (ref 1.5–2.5)

## 2015-03-12 MED ORDER — AMIODARONE HCL 400 MG PO TABS: 200.0000 mg | ORAL_TABLET | Freq: Two times a day (BID) | ORAL | Status: DC

## 2015-03-12 MED ORDER — POTASSIUM CHLORIDE CRYS ER 20 MEQ PO TBCR: 40.0000 meq | EXTENDED_RELEASE_TABLET | Freq: Every day | ORAL | Status: DC

## 2015-03-12 MED ORDER — FUROSEMIDE 40 MG PO TABS: 40.0000 mg | ORAL_TABLET | Freq: Every day | ORAL | Status: DC

## 2015-03-12 MED ORDER — PANTOPRAZOLE SODIUM 40 MG PO TBEC: 40.0000 mg | DELAYED_RELEASE_TABLET | Freq: Two times a day (BID) | ORAL | Status: DC

## 2015-03-12 MED ORDER — OMEPRAZOLE 40 MG PO CPDR: 40.0000 mg | DELAYED_RELEASE_CAPSULE | Freq: Every day | ORAL | Status: DC

## 2015-03-12 MED ORDER — METOPROLOL SUCCINATE ER 25 MG PO TB24: 12.5000 mg | ORAL_TABLET | Freq: Every day | ORAL | Status: DC

## 2015-03-12 NOTE — Discharge Summary (Signed)
Physician Discharge Summary  Patient ID: Tiffany [name] MRN: [id number] DOB/AGE: 05/29/1929 81 y.o.  Admit date: 03/01/2015 Discharge date: 03/12/2015  Problem List Principal Problem:   Upper GI bleeding Active Problems:   Acute respiratory failure with hypoxemia   Acute respiratory failure   Acute post-hemorrhagic anemia   Encounter for central line placement   Malnutrition of moderate degree   Atrial fibrillation with rapid ventricular response   Dyspnea   Pleural effusion   Pulmonary edema  HPI: 81 yo WM who presented to [facility] with new onset hematemesis and was taken to Endo suite for scope without source identification. She was intubated for the procedure and remains intubated. She is going to IR intubated and PCCM asked to manage vent and her car while intubated. She is not on anticoagulants as an outpatient. Currently HD stable and in route to IR. Hospital Course:  INITIAL PRESENTATION: 81 y/o female presented with hematemesis. She required intubation during endoscopy.   STUDIES:  3/23 EGD >> Dieulafoy lesion proximal stomach s/p hemoclips  SIGNIFICANT EVENTS: 3/22 Admit, intubated 3/24 Extubated 3/25 Speech assessment >> D3 diet 3/26 To ICU due to change in mental status 3/27 Refractory a fib, PRBC 1 unit for Hb 7.2, cardiology consulted 3/28 Worsening resp status / confusion. BiPAP + Precedex  3/29 Off bipap & precedex, NRB  3/29 CT Chest >> GGO in LUL, Large R, small L pleural effusions with passive atelectasis, 3 vessel CAD, spleenomegaly 3/30 Weaned to 4L, Afib w RVR with standing  3/30 thora with 1200 cc yellow fluid 0- TRANSUDATE , CYTOLOGY - NON DIAGNOSTIC 03/10/15 : Comfortable post thora. Moved to floor  ASSESSMENT / PLAN:  PULMONARY ETT 3/22 for Endo>> 3/24 A: Acute respiratory failure from pulmonary edema. Bilateral Infiltrates - unclear etiology, initially suspected to be pulmonary edema but no clearing with lasix, ? Pneumonitis vs infectious  etiolgy. Sed rate 24, BNP 567. Pleural Effusions - R>L, large on R s/p thora 1200cc 03/09/15  - down to nasal cannula o2  P:  Oxygen to keep SpO2 > 92%; RN to reassess with forehead probe -home o2 as she desats on room air walking to 76% Dc BiPAP F/u CXR prn Diurese as tolerated, per cards Follow up with pulmonary. Recheck walk O2 test as outpatient  CARDIOVASCULAR CVL None A:  New onset A fib with RVR 3/27 >> converted to sinus after dose of lopressor 3/27. Hx of HTN.  - acute systolic chf - new dx this admit P:  Lopressor, amiodarone per cardiology No anticoagulation in setting of GI bleed No ASA for 6 weeks after EGD Follow  Up with cards as OPT. This has been arranged.   RENAL  A: Hypokalemia. - resolved Hypomagnesemia. - resolved P:    Goal K > 4, goal Mg > 2 K BID with lasix Monitor BMP  GASTROINTESTINAL A:  Upper GI bleeding 2nd to Dieulafoy lesion. Dysphagia.  - eating regular food now per daughter . Imrpoved from d1 diet  P:  Diet per speech Continue Protonix  Will need f/u with GI as outpt  HEMATOLOGIC  Recent Labs (last 2 labs)      Recent Labs  03/10/15 0330 03/11/15 0455  HGB 10.2* 9.8*      A:  Acute blood loss anemia 2nd to GI bleed. Thrombocytopenia from consumption related to bleeding.  - no active bleeding P:  F/u CBC Transfuse for Hb < 7 or bleeding SCD's for DVT prevention  INFECTIOUS A:  UTI. P:  Day  4/4 Cipro, d/c 3/29  Urine 3/26 >> E-Coli >> sens CIPRO Hold further abx, monitor fever curve / wbc  ENDOCRINE A:  No acute issue. P:  Monitor blood sugar on BMET  NEUROLOGIC A: Hx of dementia. P:  Monitor mental status PT as tolerated  Arranged for home aide and nurse to visit.         Labs at discharge Lab Results  Component Value Date   CREATININE 1.21* 03/12/2015   BUN 21 03/12/2015   NA 141 03/12/2015   K 4.1 03/12/2015   CL 102 03/12/2015   CO2 32  03/12/2015   Lab Results  Component Value Date   WBC 9.1 03/12/2015   HGB 9.6* 03/12/2015   HCT 31.0* 03/12/2015   MCV 93.1 03/12/2015   PLT 160 03/12/2015   Lab Results  Component Value Date   ALT 16 03/01/2015   AST 21 03/01/2015   ALKPHOS 72 03/01/2015   BILITOT 0.6 03/01/2015   Lab Results  Component Value Date   INR 1.30 03/03/2015   INR 1.25 03/01/2015   INR 1.26 03/01/2015    Current radiology studies No results found.  Disposition:  01-Home or Self Care     Medication List    STOP taking these medications        aspirin 81 MG tablet     diphenhydramine-acetaminophen 25-500 MG Tabs  Commonly known as:  TYLENOL PM     DULoxetine 60 MG capsule  Commonly known as:  CYMBALTA     hydrochlorothiazide 12.5 MG tablet  Commonly known as:  HYDRODIURIL     lisinopril 20 MG tablet  Commonly known as:  PRINIVIL,ZESTRIL     loratadine 10 MG tablet  Commonly known as:  CLARITIN     omeprazole 40 MG capsule  Commonly known as:  PRILOSEC     traZODone 50 MG tablet  Commonly known as:  DESYREL      TAKE these medications        amiodarone 400 MG tablet  Commonly known as:  PACERONE  Take 0.5 tablets (200 mg total) by mouth 2 (two) times daily.     CRANBERRY PO  Take 3,600 mg by mouth daily.     furosemide 40 MG tablet  Commonly known as:  LASIX  Take 1 tablet (40 mg total) by mouth daily.     metoprolol succinate 25 MG 24 hr tablet  Commonly known as:  TOPROL-XL  Take 0.5 tablets (12.5 mg total) by mouth daily.     multivitamin tablet  Take 1 tablet by mouth daily.     pantoprazole 40 MG tablet  Commonly known as:  PROTONIX  Take 1 tablet (40 mg total) by mouth 2 (two) times daily before a meal.     potassium chloride SA 20 MEQ tablet  Commonly known as:  K-DUR,KLOR-CON  Take 2 tablets (40 mEq total) by mouth daily.          Discharged Condition: fair  Time spent on discharge greater than 40 minutes.  Vital signs at  Discharge. Temp:  [97.8 F (36.6 C)-98.8 F (37.1 C)] 97.8 F (36.6 C) (04/02 0506) Pulse Rate:  [70-72] 72 (04/02 0506) Resp:  [15-16] 16 (04/02 0506) BP: (124-140)/(52-54) 124/54 mmHg (04/02 0506) SpO2:  [77 %-100 %] 100 % (04/02 1201) Weight:  [122 lb 6.4 oz (55.52 kg)] 122 lb 6.4 oz (55.52 kg) (04/02 0506) Office follow up Special Information or instructions. She is to follow up with cardiology. Follow  up with PCCM due to new O2 needs.  Follow up with GI for recent GI bleed. Home O2, Home health RN and aide, Rolling walker all have been arranged for her.   Signed: Richardson Landry Minor ACNP Maryanna Shape PCCM Pager [phone] till 3 pm If no answer page [phone] 03/12/2015, 1:38 PM   Baltazar Apo, MD, PhD 03/12/2015, 2:04 PM [facility] [phone] or if no answer [phone]

## 2015-03-12 NOTE — Discharge Instructions (Signed)
Oxygen via nasal prongs at 3 litre/minute till you are seen in office. Please call our office [phone] for an appointment to be seen. Call on Monday for appointment in 7-10 days. You need to follow up with cardiology and their office will call you on Monday. If you have any problems please call [phone] to get the person on call.

## 2015-03-12 NOTE — Progress Notes (Signed)
CARE MANAGEMENT NOTE 03/12/2015  Patient:  Tiffany [name], Tiffany [name]   Account Number:  [id number]  Date Initiated:  03/02/2015  Documentation initiated by:  DAVIS,RHONDA  Subjective/Objective Assessment:   81 yo WM who presented to [facility] with new onset hematemesis and was taken to Endo suite for scope without source identification. She was intubated for the procedure and remains intubated.     Action/Plan:   tbd determined gi bld continues on [id number] per notes   Anticipated DC Date:  03/10/2015   Anticipated DC Plan:  [place]  In-house referral  NA      DC Planning Services  CM consult      [facility] Choice  HOME HEALTH   Choice offered to / List presented to:  C-4 Adult Children   DME arranged  [facility]      DME agency  [place] arranged  HH-1 RN  [place] [facility]      [place].   Status of service:  Completed, signed off Medicare Important Message given?  YES (If response is "NO", the following Medicare IM given date fields will be blank) Date Medicare IM given:  03/12/2015 Medicare IM given by:  [facility] Date Additional Medicare IM given:   Additional Medicare IM given by:    Discharge Disposition:  [place]  Per UR Regulation:  Reviewed for med. necessity/level of care/duration of stay  If discussed at [place] of Stay Meetings, dates discussed:    Comments:  03/12/2015 1500 Received call from [place] rep and they will be able to deliver DME around 4 or 4:30 pm. Notified [facility] of new referral for [facility]. Jonnie Finner RN CCM Case Mgmt phone [phone]  03/12/2015 1400 NCM faxed orders for oxygen and RW with seat to [place]. Contact weekend rep, Ty for new orders and hospital dc. Spoke to pt and gave permission to speak to dtr, Nicki. Provided dtr with HHA list. Offered choice. Dtr picked [facility] for [facility]. Will notify [facility] of new referral. Jonnie Finner RN CCM Case Mgmt phone [phone]  March 07, 2015/Rhonda L. Rosana Hoes, RN, BSN, CCM. Case Management [place] [phone] No discharge needs present of time of review. Iv amiodarone, nonrebreath er mask at 100% fio2.  March 02, 2015/Rhonda L. Rosana Hoes, RN, BSN, CCM. Case Management [place] [phone] No discharge needs present of time of review.

## 2015-03-12 NOTE — Progress Notes (Signed)
SATURATION QUALIFICATIONS: (This note is used to comply with regulatory documentation for home oxygen)  Patient Saturations on Room Air at Rest = 93%  Patient Saturations on Room Air while Ambulating = 77%  Patient Saturations on 3 Liters of oxygen while Ambulating = 100%  Please briefly explain why patient needs home oxygen: Pt desats when ambulating

## 2015-03-12 NOTE — Progress Notes (Signed)
Physical Therapy Treatment Patient Details Name: Tiffany [name] MRN: [id number] DOB: 1929/03/16 Today's Date: 03-13-15    History of Present Illness Tiffany [name] is a 81 y.o. female presenting with acute onset hematemesis on 3/22.Enoscopy performed 03/02/15 for upper GI bleed. required ventilator until 3/24.Marland Kitchen transferred to SDU  03/05/15 with AMS, respiratory distress,  afib/RVR.     PT Comments    Meet with pt to see if any questions or needs prior to discharge. educated pt a lot on breathing exercises, profusion, and monitored O2 sats. Pt had just returned from the bathroom on RA so checked O2 sats at 90% supine, and once she sat up and performed some breathing exercises and standing we increased her O2 to 97%. Educated pt on HEP for quads and increased strengthening  and profusion as well. Pt and dtr very appreciative of the time spent with them and explaining these items. We felt no need to ambulate at this time, for she is doing that pretty well.    Follow Up Recommendations  Home health PT     Equipment Recommendations  Rolling walker with 5" wheels    Recommendations for Other Services       Precautions / Restrictions      Mobility  Bed Mobility Overal bed mobility: Independent Bed Mobility: Supine to Sit;Sit to Supine     Supine to sit: Independent Sit to supine: Independent      Transfers Overall transfer level: Independent Equipment used: None Transfers: Sit to/from Stand Sit to Stand: Independent            Ambulation/Gait                 Stairs            Wheelchair Mobility    Modified Rankin (Stroke Patients Only)       Balance                                    Cognition                            Exercises      General Comments        Pertinent Vitals/Pain      Home Living                      Prior Function            PT Goals (current goals can now be found in  the care plan section) Acute Rehab PT Goals Patient Stated Goal: to go home PT Goal Formulation: With patient/family Time For Goal Achievement: 03/18/15 Potential to Achieve Goals: Good Progress towards PT goals: Progressing toward goals    Frequency  Min 3X/week    PT Plan      Co-evaluation             End of Session   Activity Tolerance: Patient tolerated treatment well       Time: 1600-1620 PT Time Calculation (min) (ACUTE ONLY): 20 min  Charges:  $Therapeutic Activity: 8-22 mins                    G CodesClide Dales Mar 13, 2015, 5:14 PM  Clide Dales, PT Pager: [phone] 03-13-15

## 2015-03-12 NOTE — Progress Notes (Signed)
SUBJECTIVE: Feeling very well. Denies chest pain, shortness of breath, palpitations, and bleeding problems. Described events leading up to hospitalization to me. Eager to go home today.     Intake/Output Summary (Last 24 hours) at 03/12/15 0856 Last data filed at 03/12/15 0802  Gross per 24 hour  Intake    480 ml  Output    800 ml  Net   -320 ml    Current Facility-Administered Medications  Medication Dose Route Frequency Provider Last Rate Last Dose  . amiodarone (PACERONE) tablet 400 mg  400 mg Oral BID Lelon Perla, MD   400 mg at 03/11/15 2141  . antiseptic oral rinse (CPC / CETYLPYRIDINIUM CHLORIDE 0.05%) solution 7 mL  7 mL Mouth Rinse q12n4p Brand Males, MD   7 mL at 03/11/15 1705  . chlorhexidine (PERIDEX) 0.12 % solution 15 mL  15 mL Mouth Rinse BID Brand Males, MD   15 mL at 03/12/15 0759  . furosemide (LASIX) tablet 40 mg  40 mg Oral Daily Lelon Perla, MD   40 mg at 03/11/15 1700  . metoprolol succinate (TOPROL-XL) 24 hr tablet 12.5 mg  12.5 mg Oral Daily Lelon Perla, MD   12.5 mg at 03/11/15 0919  . pantoprazole (PROTONIX) EC tablet 40 mg  40 mg Oral BID AC Chesley Mires, MD   40 mg at 03/12/15 0759  . potassium chloride SA (K-DUR,KLOR-CON) CR tablet 40 mEq  40 mEq Oral Daily Grace Bushy Minor, NP   40 mEq at 03/11/15 0918  . zolpidem (AMBIEN) tablet 5 mg  5 mg Oral QHS Brand Males, MD   5 mg at 03/11/15 2141    Filed Vitals:   03/11/15 0521 03/11/15 1331 03/11/15 2040 03/12/15 0506  BP: 128/51 105/82 140/52 124/54  Pulse: 62 76 70 72  Temp: 98.2 F (36.8 C) 98.2 F (36.8 C) 98.8 F (37.1 C) 97.8 F (36.6 C)  TempSrc: Oral Oral Oral   Resp: 17 18 15 16   Height:      Weight:    122 lb 6.4 oz (55.52 kg)  SpO2: 99% 99% 100% 100%    PHYSICAL EXAM General: NAD HEENT: Normal. Neck: No JVD, no thyromegaly.  Lungs: Diminished at bases, R>L.Marland Kitchen CV: Nondisplaced PMI.  Regular rate and rhythm, normal S1/S2, no S3/S4, no murmur.  No  pretibial edema. Normal pedal pulses.  Abdomen: Soft, nontender, no distention.  Neurologic: Alert and oriented x 3.  Psych: Normal affect. Musculoskeletal: Normal range of motion. No gross deformities. Extremities: No clubbing or cyanosis.   TELEMETRY: Reviewed telemetry pt in normal sinus rhythm.  LABS: Basic Metabolic Panel:  Recent Labs  03/11/15 0455 03/12/15 0449  NA 138 141  K 4.0 4.1  CL 100 102  CO2 31 32  GLUCOSE 102* 95  BUN 22 21  CREATININE 1.13* 1.21*  CALCIUM 8.2* 8.2*  MG  --  2.0   Liver Function Tests:  Recent Labs  03/09/15 1520  PROT 5.8*   No results for input(s): LIPASE, AMYLASE in the last 72 hours. CBC:  Recent Labs  03/11/15 0455 03/12/15 0449  WBC 9.3 9.1  HGB 9.8* 9.6*  HCT 31.5* 31.0*  MCV 91.8 93.1  PLT 166 160   Cardiac Enzymes: No results for input(s): CKTOTAL, CKMB, CKMBINDEX, TROPONINI in the last 72 hours. BNP: Invalid input(s): POCBNP D-Dimer: No results for input(s): DDIMER in the last 72 hours. Hemoglobin A1C: No results for input(s): HGBA1C in the last  72 hours. Fasting Lipid Panel:  Recent Labs  03/09/15 1520  CHOL 148   Thyroid Function Tests: No results for input(s): TSH, T4TOTAL, T3FREE, THYROIDAB in the last 72 hours.  Invalid input(s): FREET3 Anemia Panel: No results for input(s): VITAMINB12, FOLATE, FERRITIN, TIBC, IRON, RETICCTPCT in the last 72 hours.  RADIOLOGY: Ct Chest High Resolution  03/08/2015   CLINICAL DATA:  81 year old female with increasing shortness of breath. Abnormal chest x-ray.  EXAM: CT CHEST WITHOUT CONTRAST  TECHNIQUE: Multidetector CT imaging of the chest was performed following the standard protocol without intravenous contrast. High resolution imaging of the lungs, as well as inspiratory and expiratory imaging, was performed.  COMPARISON:  No prior chest CT.  Chest x-ray 03/08/2015.  FINDINGS: Mediastinum/Lymph Nodes: Heart size is normal. There is no significant pericardial  fluid, thickening or pericardial calcification. There is atherosclerosis of the thoracic aorta, the great vessels of the mediastinum and the coronary arteries, including calcified atherosclerotic plaque in the left main, left anterior descending, left circumflex and right coronary arteries. No pathologically enlarged mediastinal or hilar lymph nodes. Please note that accurate exclusion of hilar adenopathy is limited on noncontrast CT scans. Fluid and debris throughout the esophagus. No axillary adenopathy.  Lungs/Pleura: Assessment of the lung parenchyma is significantly limited by a large amount of patient respiratory motion. With these limitations in mind, there are patchy areas of ground-glass attenuation, most evident in the left upper lobe. This is associated with some septal thickening and focal architectural distortion. Complete atelectasis of the right lower lobe and right middle lobe. Subsegmental atelectasis in the dependent portions of the left lower lobe. Large right and small left pleural effusions layering dependently. Central airways are patent. High-resolution images are essentially nondiagnostic.  Upper Abdomen: The spleen is incompletely visualized but appears enlarged, measuring up to 14.6 x 5.1 cm on axial images.  Musculoskeletal/Soft Tissues: Multiple old healed right-sided rib fractures. There are no aggressive appearing lytic or blastic lesions noted in the visualized portions of the skeleton.  IMPRESSION: 1. Asymmetrically distributed ground-glass attenuation airspace disease and septal thickening, involving predominantly the left upper lobe, favored to reflect an acute pneumonia. 2. High-resolution images were very limited secondary to respiratory motion, but there are no definite imaging findings to strongly suggest interstitial lung disease. 3. Large right and small left pleural effusions layering dependently with complete passive atelectasis of the right lower and middle lobes, and mild  subsegmental atelectasis of the dependent left lower lobe. 4. Atherosclerosis, including left main and 3 vessel coronary artery disease. 5. Splenomegaly.   Electronically Signed   By: Vinnie Langton M.D.   On: 03/08/2015 18:11   Dg Chest Port 1 View  03/10/2015   CLINICAL DATA:  Pleural effusion.  EXAM: PORTABLE CHEST - 1 VIEW  COMPARISON:  03/09/2015.  03/09/2015.  CT 03/08/2015.  FINDINGS: Mediastinum and hilar structures normal. Stable cardiomegaly. Stable diffuse pulmonary interstitial prominence. Bibasilar atelectasis. Mild left lower lobe infiltrate cannot be excluded on today's exam. Continued improvement of right pleural effusion. Small left pleural effusion. No pneumothorax. Old right rib fractures noted.  IMPRESSION: 1. Continued improvement right pleural effusion. 2. Cardiomegaly with persistent mild pulmonary interstitial prominence. Mild component congestive heart failure may be present. 3. Bibasilar atelectasis. Left lower lobe infiltrate cannot be excluded on today's exam . Small left pleural effusion cannot be excluded .   Electronically Signed   By: Marcello Moores  Register   On: 03/10/2015 07:35   Dg Chest Port 1 View  03/09/2015  CLINICAL DATA:  Status post right thoracentesis.  EXAM: PORTABLE CHEST - 1 VIEW  COMPARISON:  03/09/2015 at 4:56 a.m.  FINDINGS: Diminished right pleural effusion, now small to moderate, with basilar atelectasis or pneumonia. No re-expansion edema or definite pneumothorax. Multiple remote right-sided rib fractures noted. The left lung is better aerated compared to earlier study. Normal heart size and aortic contours.  IMPRESSION: No adverse findings after right thoracentesis.   Electronically Signed   By: Monte Fantasia M.D.   On: 03/09/2015 14:52   Dg Chest Port 1 View  03/09/2015   CLINICAL DATA:  Acute respiratory failure  EXAM: PORTABLE CHEST - 1 VIEW  COMPARISON:  03/08/2015  FINDINGS: Moderate to large right pleural effusion is unchanged. Right lower lobe  atelectasis unchanged due to effusion.  Left upper lobe airspace disease is slightly improved. Small left effusion unchanged.  IMPRESSION: Moderate to large right effusion and right lower lobe atelectasis unchanged  Left upper lobe infiltrate shows mild interval improvement. Small left effusion unchanged.   Electronically Signed   By: Franchot Gallo M.D.   On: 03/09/2015 07:17   Dg Chest Port 1 View  03/08/2015   CLINICAL DATA:  Edema.  Shortness of breath.  EXAM: PORTABLE CHEST - 1 VIEW  COMPARISON:  03/07/2015.  FINDINGS: Mediastinum and hilar structures are stable. Heart size stable. Diffuse bilateral pulmonary infiltrates with bilateral pleural effusions remain. Right side greater than left. No change. No pneumothorax. No acute osseus abnormality.  IMPRESSION: 1. Persistent bilateral pulmonary infiltrates/edema. 2. Persistent bilateral effusions, right side greater than left.   Electronically Signed   By: Marcello Moores  Register   On: 03/08/2015 07:12   Dg Chest Port 1 View  03/07/2015   CLINICAL DATA:  CHF.  EXAM: PORTABLE CHEST - 1 VIEW  COMPARISON:  None.  FINDINGS: Mediastinum hilar structures stable. Heart size stable. Diffuse bilateral pulmonary infiltrates with mild pleural effusions remain. Pleural effusions appear to have increased. No pneumothorax. No acute osseus abnormality . Surgical clips upper abdomen.  IMPRESSION: 1. Persistent trauma bilateral pulmonary infiltrates/edema. 2. Bilateral pleural effusions, increased from prior exam.   Electronically Signed   By: [place]   On: 03/07/2015 07:11   Dg Chest Port 1 View  03/06/2015   CLINICAL DATA:  Shortness of Breath  EXAM: PORTABLE CHEST - 1 VIEW  COMPARISON:  March 05, 2015  FINDINGS: There is increase in airspace consolidation in the left upper lobe. There is a moderate effusion on the right which is layering. There is a much smaller left effusion. There is stable patchy consolidation in the left base. Heart is mildly enlarged with  pulmonary vascularity within normal limits. There is atherosclerotic change in the aortic arch region. Bones are osteoporotic.  IMPRESSION: Increase in left upper lobe airspace consolidation. Patchy infiltrate in the left base remains stable as do pleural effusions bilaterally, larger on the right than on the left. No new opacity is identified on the right. No change in cardiac silhouette.   Electronically Signed   By: Lowella Grip III M.D.   On: 03/06/2015 07:29   Dg Chest Port 1 View  03/05/2015   CLINICAL DATA:  Mental status change, shortness of breath. Low O2 sats.  EXAM: PORTABLE CHEST - 1 VIEW  COMPARISON:  03/03/2015  FINDINGS: There are bilateral pleural effusions, right larger than left, both enlarging since prior study. Effusion on the right is moderate to large. Worsening aeration in the right lung, likely atelectasis. There is also interstitial prominence  throughout the lungs which could reflect edema. Heart is mildly enlarged.  IMPRESSION: Worsening bilateral effusions and bilateral airspace disease, both right greater than left. Airspace disease likely reflects a combination of edema and right side atelectasis.   Electronically Signed   By: Rolm Baptise M.D.   On: 03/05/2015 17:19   Dg Chest Port 1 View  03/03/2015   CLINICAL DATA:  Hypoxia  EXAM: PORTABLE CHEST - 1 VIEW  COMPARISON:  March 02, 2015  FINDINGS: Endotracheal tube tip is 5.4 cm above the carina. No pneumothorax. There is a moderate effusion on the right which is layering. There is underlying emphysematous change. There is no edema or consolidation. Heart is upper normal in size with pulmonary vascularity within normal limits. No adenopathy. There old healed rib fractures on the right.  IMPRESSION: Endotracheal tube as described without pneumothorax. Layering right effusion. No edema or consolidation. No change in cardiac silhouette.   Electronically Signed   By: Lowella Grip III M.D.   On: 03/03/2015 07:14   Dg Chest  Port 1 View  03/02/2015   CLINICAL DATA:  Intubation.  EXAM: PORTABLE CHEST - 1 VIEW  COMPARISON:  03/01/2015.  FINDINGS: Endotracheal tube in stable position. Interim placement of NG tube, tip appears to be in the stomach. Mild right lower lobe infiltrate. Heart size pulmonary vascularity stable. No pleural effusion or pneumothorax.  IMPRESSION: 1. Interim placement of NG tube, its tip appears to be in the stomach. Endotracheal tube in stable position. 2. Interim appearance of right lower lobe infiltrate.   Electronically Signed   By: Marcello Moores  Register   On: 03/02/2015 07:26   Dg Chest Port 1 View  03/01/2015   CLINICAL DATA:  Status post manipulation of in situ central line  EXAM: PORTABLE CHEST - 1 VIEW  COMPARISON:  09/12/2011  FINDINGS: ET tube tip is above the carina. A central venous catheter is not identified. Normal heart size. There is calcified plaque within the aortic arch. Chronic right posterior lateral rib fracture deformities are noted.  IMPRESSION: 1. ET tube tip in satisfactory position above the carina. 2. No central venous catheter identified.   Electronically Signed   By: Kerby Moors M.D.   On: 03/01/2015 19:45   Ct Angio Abd/pel W/ And/or W/o  03/01/2015   CLINICAL DATA:  Hematemesis and significant blood loss with endoscopy demonstrating blood in the distal esophagus and proximal stomach.  EXAM: CTA ABDOMEN AND PELVIS WITHOUT CONTRAST  TECHNIQUE: Multidetector CT imaging of the abdomen and pelvis was performed using the standard protocol during bolus administration of intravenous contrast. Multiplanar reconstructed images and MIPs were obtained and reviewed to evaluate the vascular anatomy. Arterial and venous phases of imaging were performed of the abdomen and pelvis.  CONTRAST:  111mL OMNIPAQUE IOHEXOL 350 MG/ML SOLN  COMPARISON:  None.  FINDINGS: The distal esophagus contains fluid and is moderately distended. Just above the GE junction, some prominent mucosal vessels are  identified as well as mucosal hyperemia of the distal esophagus. Small vessels do not appear to represent typical esophagogastric varices. The stomach shows hyperemia without varices. No contrast extravasation is identified by CTA.  Arterial anatomy appears normal with normal patency of the celiac axis and its branches. A small left gastric artery is identified. The splenic artery and hepatic arteries are normally patent. Mild plaque is present at the origin of the superior mesenteric artery without significant stenosis. Distal SMA branches appear patent. The inferior mesenteric artery is small in caliber and patent.  There is no evidence of aortic aneurysmal disease. A single left renal artery shows no significant stenosis. There likely is some degree of stenosis of the proximal right renal artery approaching 60- 70% in caliber. Bilateral iliac and common femoral arteries are normally patent.  The liver shows mild steatosis without overt cirrhotic changes. No hepatic masses or biliary dilatation identified. The spleen is mildly enlarged. The gallbladder, pancreas, adrenal glands and kidneys are unremarkable. There is a simple cyst of the left kidney. Bowel shows moderate fecal material throughout the colon. There is no evidence of acute bowel obstruction. No free air is identified. A small amount of free fluid is present adjacent to the inferior liver. No masses or enlarged lymph nodes are seen. No hernias are identified. The bladder is unremarkable. The uterus has been removed. Extensive degenerative disease is noted of the lumbar spine with evidence of prior lumbar fusion.  Review of the MIP images confirms the above findings.  IMPRESSION: 1. Fluid filled distal esophagus and stomach with mucosal hyperemia in the distal esophagus and throughout the stomach. There are some prominent mucosal vessels identified in the distal esophagus without evidence of contrast extravasation or typical esophageal varices. No  gastric varices or masses are identified. 2. No vascular malformations identified. There is moderate stenosis of the proximal right renal artery. 3. Hepatic steatosis without overt cirrhosis. The spleen is mildly enlarged.   Electronically Signed   By: Aletta Edouard M.D.   On: 03/01/2015 15:45      ASSESSMENT AND PLAN: 1. Paroxysmal atrial fibrillation-the patient remains in sinus rhythm this AM. Continue amiodarone 400 mg by mouth twice a day. Decrease to 200 mg daily at discharge. Would continue for approximately 8 weeks and if she maintains sinus rhythm discontinue at that point. Continue metoprolol. Atrial fibrillation most likely driven by recent bleed and infection. Would not anticoagulate at this point given recent GI bleed.  2. Recent GI bleed-Hemoglobin stable at 9.6 this morning. No further episodes.  3. Acute combined systolic/diastolic congestive heart failure-Continues to improve. Echocardiogram showed ejection fraction 40-45%. Continue Lasix 40 mg daily but may consider decreasing to 20 mg daily as outpatient next week. Needs close follow up of renal function. Dyspnea improved following thoracentesis.  4. Hypokalemia-Normal at 4.1 today.  Dispo: Stable for discharge from my standpoint.  Kate Sable, M.D., F.A.C.C.

## 2015-03-12 NOTE — Progress Notes (Signed)
Pt with daughter at bedside given D/C instructions including f/u appts, O2 usage, when to call MD, meds, prescriptions.  Verbalized understanding.  Belongings packed, waiting on home equipment to be delivered. Pt stable

## 2015-03-13 LAB — BODY FLUID CULTURE
CULTURE: NO GROWTH
Special Requests: NORMAL

## 2015-03-15 ENCOUNTER — Telehealth: Payer: Self-pay

## 2015-03-15 ENCOUNTER — Telehealth: Payer: Self-pay | Admitting: Cardiology

## 2015-03-15 NOTE — Telephone Encounter (Signed)
Error

## 2015-03-16 NOTE — Telephone Encounter (Signed)
Closed encounter °

## 2015-03-17 ENCOUNTER — Ambulatory Visit (INDEPENDENT_AMBULATORY_CARE_PROVIDER_SITE_OTHER): Payer: Commercial Managed Care - HMO | Admitting: Family

## 2015-03-17 ENCOUNTER — Encounter: Payer: Self-pay | Admitting: Family

## 2015-03-17 VITALS — BP 94/60 | HR 48 | Temp 98.7°F | Ht 64.0 in | Wt 122.8 lb

## 2015-03-17 DIAGNOSIS — I482 Chronic atrial fibrillation, unspecified: Secondary | ICD-10-CM

## 2015-03-17 DIAGNOSIS — J9601 Acute respiratory failure with hypoxia: Secondary | ICD-10-CM | POA: Diagnosis not present

## 2015-03-17 DIAGNOSIS — G4700 Insomnia, unspecified: Secondary | ICD-10-CM | POA: Diagnosis not present

## 2015-03-17 DIAGNOSIS — K922 Gastrointestinal hemorrhage, unspecified: Secondary | ICD-10-CM

## 2015-03-17 LAB — CBC WITH DIFFERENTIAL/PLATELET
BASOS ABS: 0.1 10*3/uL (ref 0.0–0.1)
Basophils Relative: 0.6 % (ref 0.0–3.0)
Eosinophils Absolute: 0.2 10*3/uL (ref 0.0–0.7)
Eosinophils Relative: 1.1 % (ref 0.0–5.0)
HCT: 33.9 % — ABNORMAL LOW (ref 36.0–46.0)
HEMOGLOBIN: 11.1 g/dL — AB (ref 12.0–15.0)
LYMPHS PCT: 14.1 % (ref 12.0–46.0)
Lymphs Abs: 2.1 10*3/uL (ref 0.7–4.0)
MCHC: 32.6 g/dL (ref 30.0–36.0)
MCV: 87.7 fl (ref 78.0–100.0)
Monocytes Absolute: 1 10*3/uL (ref 0.1–1.0)
Monocytes Relative: 7 % (ref 3.0–12.0)
NEUTROS PCT: 77.2 % — AB (ref 43.0–77.0)
Neutro Abs: 11.3 10*3/uL — ABNORMAL HIGH (ref 1.4–7.7)
Platelets: 274 10*3/uL (ref 150.0–400.0)
RBC: 3.87 Mil/uL (ref 3.87–5.11)
RDW: 15 % (ref 11.5–15.5)
WBC: 14.6 10*3/uL — ABNORMAL HIGH (ref 4.0–10.5)

## 2015-03-17 LAB — BASIC METABOLIC PANEL
BUN: 33 mg/dL — ABNORMAL HIGH (ref 6–23)
CO2: 35 mEq/L — ABNORMAL HIGH (ref 19–32)
CREATININE: 1.36 mg/dL — AB (ref 0.40–1.20)
Calcium: 9.6 mg/dL (ref 8.4–10.5)
Chloride: 102 mEq/L (ref 96–112)
GFR: 39.43 mL/min — ABNORMAL LOW (ref >60.00)
GLUCOSE: 115 mg/dL — AB (ref 70–99)
Potassium: 5.8 mEq/L — ABNORMAL HIGH (ref 3.5–5.1)
Sodium: 143 mEq/L (ref 135–145)

## 2015-03-17 LAB — ANA, BODY FLUID: ANTI-NUCLEAR AB, IGG: DETECTED — AB

## 2015-03-17 LAB — RHEUMATOID FACTORS, FLUID: Cortisol #1 (Base): NEGATIVE

## 2015-03-17 MED ORDER — CLONAZEPAM 0.5 MG PO TABS: 0.5000 mg | ORAL_TABLET | Freq: Every day | ORAL | Status: DC

## 2015-03-17 NOTE — Patient Instructions (Addendum)
Oxygen Use at Home     Oxygen can be prescribed for home use. The prescription will show the flow rate. This is how much oxygen is to be used per minute. This will be listed in liters per minute (LPM or L/M). A liter is a metric measurement of volume. You will use oxygen therapy as directed. It can be used while exercising, sleeping, or at rest. You may need oxygen continuously. Your health care provider may order a blood oxygen test (arterial blood gas or pulse oximetry test) that will show what your oxygen level is. Your health care provider will use these measurements to learn about your needs and follow your progress. Home oxygen therapy is commonly used on patients with various lung (pulmonary) related conditions. Some of these conditions include:  Asthma.  Lung cancer.  Pneumonia.  Emphysema.  Chronic bronchitis.  Cystic fibrosis.  Other lung diseases.  Pulmonary fibrosis.  Occupational lung disease.  Heart failure.  Chronic obstructive pulmonary disease (COPD). 3 COMMON WAYS OF PROVIDING OXYGEN THERAPY  Gas: The gas form of oxygen is put into variously sized cylinders or tanks. The cylinders or oxygen tanks contain compressed oxygen. The cylinder is equipped with a regulator that controls the flow rate. Because the flow of oxygen out of the cylinder is constant, an oxygen conserving device may be attached to the system to avoid waste. This device releases the gas only when you inhale and cuts it off when you exhale. Oxygen can be provided in a small cylinder that can be carried with you. Large tanks are heavy and are only for stationary use. After use, empty tanks must be exchanged for full tanks.  Liquid: The liquid form of oxygen is put into a container similar to a thermos. When released, the liquid converts to a gas and you breathe it in just like the compressed gas. This storage method takes up less space than the compressed gas cylinder, and you can transfer the  liquid to a small, portable vessel at home. Liquid oxygen is more expensive than the compressed gas, and the vessel vents when not in use. An oxygen conserving device may be built into the vessel to conserve the oxygen. Liquid oxygen is very cold, around 297 below zero.  Oxygen concentrator: This medical device filters oxygen from room air and gives almost 100% oxygen to the patient. Oxygen concentrators are powered by electricity. Benefits of this system are:  It does not need to be resupplied.  It is not as costly as liquid oxygen.  Extra tubing permits the user to move around easier. There are several types of small, portable oxygen systems available which can help you remain active and mobile. You must have a cylinder of oxygen as a backup in the event of a power failure. Advise your electric power company that you are on oxygen therapy in order to get priority service when there is a power failure. OXYGEN DELIVERY DEVICES There are 3 common ways to deliver oxygen to your body.  Nasal cannula. This is a 2-pronged device inserted in the nostrils that is connected to tubing carrying the oxygen. The tubing can rest on the ears or be attached to the frame of eyeglasses.  Mask. People who need a high flow of oxygen generally use a mask.  Transtracheal catheter. Transtracheal oxygen therapy requires the insertion of a small, flexible tube (catheter) in the windpipe (trachea). This catheter is held in place by a necklace. Since transtracheal oxygen bypasses the  mouth, nose, and throat, a humidifier is absolutely required at flow rates of 1 LPM or greater. OXYGEN USE SAFETY TIPS  Never smoke while using oxygen. Oxygen does not burn or explode, but flammable materials will burn faster in the presence of oxygen.  Keep a Data processing manager close by. Let your fire department know that you have oxygen in your home.  Warn visitors not to smoke near you when you are using oxygen. Put up "no smoking"  signs in your home where you most often use the oxygen.  When you go to a restaurant with your portable oxygen source, ask to be seated in the nonsmoking section.  Stay at least 5 feet away from gas stoves, candles, lighted fireplaces, or other heat sources.  Do not use materials that burn easily (flammable) while using your oxygen.  If you use an oxygen cylinder, make sure it is secured to some fixed object or in a stand. If you use liquid oxygen, make sure the vessel is kept upright to keep the oxygen from pouring out. Liquid oxygen is so cold it can hurt your skin.  If you use an oxygen concentrator, call your electric company so you will be given priority service if your power goes out. Avoid using extension cords, if possible.  Regularly test your smoke detectors at home to make sure they work. If you receive care in your home from a nurse or other health care provider, he or she may also check to make sure your smoke detectors work. GUIDELINES FOR CLEANING YOUR EQUIPMENT  Wash the nasal prongs with a liquid soap. Thoroughly rinse them once or twice a week.  Replace the prongs every 2 to 4 weeks. If you have an infection (cold, pneumonia) change them when you are well.  Your health care provider will give you instructions on how to clean your transtracheal catheter.  The humidifier bottle should be washed with soap and warm water and rinsed thoroughly between each refill. Air-dry the bottle before filling it with sterile or distilled water. The bottle and its top should be disinfected after they are cleaned.  If you use an oxygen concentrator, unplug the unit. Then wipe down the cabinet with a damp cloth and dry it daily. The air filter should be cleaned at least twice a week.  Follow your home medical equipment and service company's directions for cleaning the compressor filter. HOME CARE INSTRUCTIONS   Do not change the flow of oxygen unless directed by your health care  provider.  Do not use alcohol or other sedating drugs unless instructed. They slow your breathing rate.  Do not use materials that burn easily (flammable) while using your oxygen.  Always keep a spare tank of oxygen. Plan ahead for holidays when you may not be able to get a prescription filled.  Use water-based lubricants on your lips or nostrils. Do not use an oil-based product like petroleum jelly.  To prevent your cheeks or the skin behind your ears from becoming irritated, tuck some gauze under the tubing.  If you have persistent redness under your nose, call your health care provider.  When you no longer need oxygen, your doctor will have the oxygen discontinued. Oxygen is not addicting or habit forming.  Use the oxygen as instructed. Too much oxygen can be harmful and too little will not give you the benefit you need.  Shortness of breath is not always from a lack of oxygen. If your oxygen level is not the cause of  your shortness of breath, taking oxygen will not help. SEEK MEDICAL CARE IF:   You have frequent headaches.  You have shortness of breath or a lasting cough.  You have anxiety.  You are confused.  You are drowsy or sleepy all the time.  You develop an illness which aggravates your breathing.  You cannot exercise.  You are restless.  You have blue lips or fingernails.  You have difficult or irregular breathing and it is getting worse.  You have a fever. Document Released: 02/16/2004 Document Revised: 04/12/2014 Document Reviewed: 07/08/2013 [facility] Patient Information 2015 [place], [place]. This information is not intended to replace advice given to you by your health care provider. Make sure you discuss any questions you have with your health care provider.  Palpitations A palpitation is the feeling that your heartbeat is irregular or is faster than normal. It may feel like your heart is fluttering or skipping a beat. Palpitations are usually not a  serious problem. However, in some cases, you may need further medical evaluation. CAUSES  Palpitations can be caused by:  Smoking.  Caffeine or other stimulants, such as diet pills or energy drinks.  Alcohol.  Stress and anxiety.  Strenuous physical activity.  Fatigue.  Certain medicines.  Heart disease, especially if you have a history of irregular heart rhythms (arrhythmias), such as atrial fibrillation, atrial flutter, or supraventricular tachycardia.  An improperly working pacemaker or defibrillator. DIAGNOSIS  To find the cause of your palpitations, your health care provider will take your medical history and perform a physical exam. Your health care provider may also have you take a test called an ambulatory electrocardiogram (ECG). An ECG records your heartbeat patterns over a 24-hour period. You may also have other tests, such as:  Transthoracic echocardiogram (TTE). During echocardiography, sound waves are used to evaluate how blood flows through your heart.  Transesophageal echocardiogram (TEE).  Cardiac monitoring. This allows your health care provider to monitor your heart rate and rhythm in real time.  Holter monitor. This is a portable device that records your heartbeat and can help diagnose heart arrhythmias. It allows your health care provider to track your heart activity for several days, if needed.  Stress tests by exercise or by giving medicine that makes the heart beat faster. TREATMENT  Treatment of palpitations depends on the cause of your symptoms and can vary greatly. Most cases of palpitations do not require any treatment other than time, relaxation, and monitoring your symptoms. Other causes, such as atrial fibrillation, atrial flutter, or supraventricular tachycardia, usually require further treatment. HOME CARE INSTRUCTIONS   Avoid:  Caffeinated coffee, tea, soft drinks, diet pills, and energy drinks.  Chocolate.  Alcohol.  Stop smoking if you  smoke.  Reduce your stress and anxiety. Things that can help you relax include:  A method of controlling things in your body, such as your heartbeats, with your mind (biofeedback).  Yoga.  Meditation.  Physical activity such as swimming, jogging, or walking.  Get plenty of rest and sleep. SEEK MEDICAL CARE IF:   You continue to have a fast or irregular heartbeat beyond 24 hours.  Your palpitations occur more often. SEEK IMMEDIATE MEDICAL CARE IF:  You have chest pain or shortness of breath.  You have a severe headache.  You feel dizzy or you faint. MAKE SURE YOU:  Understand these instructions.  Will watch your condition.  Will get help right away if you are not doing well or get worse. Document Released: 11/23/2000 Document Revised:  12/01/2013 Document Reviewed: 01/25/2012 [employer] Patient Information 2015 [place], [place]. This information is not intended to replace advice given to you by your health care provider. Make sure you discuss any questions you have with your health care provider.  Referral requested  with Pulmonology, Gastroenterologist, and Cardiologist

## 2015-03-17 NOTE — Progress Notes (Signed)
Subjective:    Patient ID: Tiffany [name], female    DOB: 1929-01-03, 81 y.o.   MRN: [id number]  HPI Hospital Follow-up:  Patient 81 year old, caucasian female, seen in office today for hospital follow-up.  Patient was admitted at [facility] in late March after experiencing hematemesis, melena, and unintentional weight loss at home.  During hospitalization the patient underwent an EGD to repair bleed, experienced acute respiratory distress, and Afib w/RVR. Patient is currently on continuous 3L of oxygen via nasal cannula and receiving home care nursing. Daughter reports oxygen saturations have been difficult to obtain but range in upper to 80's. Patient denies any recent blood in stool or bloody emesis. Denies chest pain and reports mild dyspnea with activity.    Request medication for sleep. Currently taking tylenol PM for sleep.  Review of Systems  Constitutional: Positive for fatigue.  HENT: Negative.   Eyes: Negative.   Respiratory: Positive for shortness of breath. Negative for apnea, cough, choking, chest tightness, wheezing and stridor.   Cardiovascular: Negative.   Gastrointestinal: Negative.   Endocrine: Negative.   Genitourinary: Negative.   Musculoskeletal: Negative.   Allergic/Immunologic: Negative.   Neurological: Negative.   Hematological: Negative.   Psychiatric/Behavioral: Negative.    . Past Medical History  Diagnosis Date  . Hypertension   . Arthritis   . Dementia     History   Social History  . Marital Status: Married    Spouse Name: N/A  . Number of Children: 1  . Years of Education: N/A   Occupational History  . Retired    Social History Main Topics  . Smoking status: Never Smoker   . Smokeless tobacco: Never Used  . Alcohol Use: Yes     Comment: glass of wine per day  . Drug Use: No  . Sexual Activity: Not on file   Other Topics Concern  . Not on file   Social History Narrative    Past Surgical History  Procedure Laterality Date   . Back surgery  09/25/2011  . Abdominal hysterectomy    . Esophagogastroduodenoscopy N/A 03/01/2015    Procedure: ESOPHAGOGASTRODUODENOSCOPY (EGD);  Surgeon: Arta Silence, MD;  Location: [facility];  Service: Endoscopy;  Laterality: N/A;  . Esophagogastroduodenoscopy N/A 03/02/2015    Procedure: ESOPHAGOGASTRODUODENOSCOPY (EGD);  Surgeon: Arta Silence, MD;  Location: [facility];  Service: Endoscopy;  Laterality: N/A;  bedside    History reviewed. No pertinent family history.  No Known Allergies  Current Outpatient Prescriptions on File Prior to Visit  Medication Sig Dispense Refill  . amiodarone (PACERONE) 400 MG tablet Take 0.5 tablets (200 mg total) by mouth 2 (two) times daily. 30 tablet 0  . CRANBERRY PO Take 3,600 mg by mouth daily.    . furosemide (LASIX) 40 MG tablet Take 1 tablet (40 mg total) by mouth daily. 30 tablet 0  . metoprolol succinate (TOPROL-XL) 25 MG 24 hr tablet Take 0.5 tablets (12.5 mg total) by mouth daily. 30 tablet 0  . Multiple Vitamin (MULTIVITAMIN) tablet Take 1 tablet by mouth daily.    . pantoprazole (PROTONIX) 40 MG tablet Take 1 tablet (40 mg total) by mouth 2 (two) times daily before a meal. 60 tablet 0  . potassium chloride SA (K-DUR,KLOR-CON) 20 MEQ tablet Take 2 tablets (40 mEq total) by mouth daily. 30 tablet 0   No current facility-administered medications on file prior to visit.    BP 94/60 mmHg  Pulse 48  Temp(Src) 98.7 F (37.1 C) (Oral)  Ht 5\' 4"  (1.626 m)  Wt 122 lb 12.8 oz (55.702 kg)  BMI 21.07 kg/m2  SpO2 81%chart    Objective:   Physical Exam  Constitutional: She is oriented to person, place, and time. She appears well-developed and well-nourished.  HENT:  Head: Normocephalic and atraumatic.  Right Ear: External ear normal.  Left Ear: External ear normal.  Nose: Nose normal.  Mouth/Throat: Oropharynx is clear and moist.  Eyes: Conjunctivae and EOM are normal. Pupils are equal, round, and reactive to light.  Neck:  Normal range of motion. Neck supple.  Cardiovascular: Normal rate and normal heart sounds.   Irregular heart rhythm. Rate within normal limits.  Pulmonary/Chest: Effort normal and breath sounds normal.  Abdominal: Soft. Bowel sounds are normal.  Musculoskeletal: Normal range of motion.  Neurological: She is alert and oriented to person, place, and time.  Skin: Skin is warm and dry.  Psychiatric: She has a normal mood and affect. Her behavior is normal. Judgment and thought content normal.          Assessment & Plan:  Tiffany [name] was seen today for hospital follow up.  Diagnoses and all orders for this visit:  Upper gastrointestinal bleed Orders: -     Ambulatory referral to Gastroenterology -     CBC with Differential -     Basic Metabolic Panel  Acute respiratory failure with hypoxia Orders: -     Ambulatory referral to Pulmonology -     Basic Metabolic Panel  Chronic atrial fibrillation Orders: -     Ambulatory referral to Cardiology -     Basic Metabolic Panel  Other orders -     clonazePAM (KLONOPIN) 0.5 MG tablet; Take 1 tablet (0.5 mg total) by mouth at bedtime.   Plan: Patient is currently stable without signs of acute distress.   Acute Respiratory Distress: Continue with 3 liters of oxygen and referral to pulmonologist.  GI Bleed: Referred to gastroenterology   AFib:  Rate is currently controlled. Referred to cardiology.  Insomnia: Stop taking tylenol PM and ordered clonazepam for sleep.  The

## 2015-03-24 ENCOUNTER — Telehealth: Payer: Self-pay | Admitting: Family

## 2015-03-24 MED ORDER — CLONAZEPAM 1 MG PO TABS: 1.0000 mg | ORAL_TABLET | Freq: Every day | ORAL | Status: DC

## 2015-03-24 NOTE — Telephone Encounter (Signed)
Tiffany [name] states she will obtain urine and send it over.  She also wanted to let Paodnda know that pt is still c/o insomnia. Pt said she sleeps for 3-4 hours then wakes up.   Pt was Rxd clonazepam on 03/17/15 for sleep. Please advise

## 2015-03-24 NOTE — Telephone Encounter (Signed)
Beth from [facility]  called to say that pt has had increased confusion and she is not sleeping at night and is trying to rule out UTI  Would like a call back       Contact Nurse [place] with [facility]  [phone]

## 2015-03-24 NOTE — Telephone Encounter (Signed)
Increase Klonopin to 1 mg at bedtime. Can take 2 until she runs out. Please phone in new RX for 1 mg qhs #30

## 2015-03-24 NOTE — Telephone Encounter (Signed)
Can they obtain a urine?

## 2015-03-25 NOTE — Telephone Encounter (Signed)
Klonopin 1 mg phoned in and Nurse Delena Serve is aware and notes she will advise pt's caregiver

## 2015-03-28 ENCOUNTER — Telehealth: Payer: Self-pay | Admitting: Family

## 2015-03-28 DIAGNOSIS — R410 Disorientation, unspecified: Secondary | ICD-10-CM

## 2015-03-28 MED ORDER — MIRTAZAPINE 15 MG PO TABS: 15.0000 mg | ORAL_TABLET | Freq: Every day | ORAL | Status: DC

## 2015-03-28 NOTE — Telephone Encounter (Signed)
Spoke with Delena Serve and advised that UA is normal and that Padonda said to d/c clonazepam and start Remeron 15mg  qhs.   Delena Serve says family has been advised that pt is at risk due to confusion, her not using her walker, not taking her medication (did not take it all weekend), being alone because she has caregiver only until 1pm, [place] and pt gets up in the middle of the night going outside saying she is going to feed the dog. Delena Serve says pt's daughter, [name] has told her that she is working on getting a Marine scientist for a couple of hours a day while Mr. Wires leaves the house to play cards. Pt daughter spends most of her time in [place] so time is limited with the pt. Delena Serve suggests possibly getting a Education officer, museum involved to evaluate the situation and referral to neurology for confusion. I advised that I will talk to [place].   Left a message for [name] to call back

## 2015-03-28 NOTE — Telephone Encounter (Signed)
Spoke with [name] and discussed pt. She would like referral for Neurology and states that she working on getting home health help for the evenings. [name] says that they talk to the pt about using her walker and keeping her O2 on and pt verbalizes understanding but seems to later forget.  I placed an order for neuro and CT of head, per Padonda's request.  Left message to advise [name] that she will receive a call to schedule both

## 2015-03-28 NOTE — Telephone Encounter (Signed)
[place] called to fu on pt's lab results. She would someone to call the family on results of the UA done 03/24/15.   Also the clonazePAM (KLONOPIN) 1 MG tablet does not seem to help w/ the sleeping. Family is looking for what was prescibed in the past for anxiety and sleeping.  Try this number first:  [name] / daughter at [phone] Can call pt cell phone at [phone].  Pt has caregiver until 1 pm

## 2015-03-31 ENCOUNTER — Ambulatory Visit (INDEPENDENT_AMBULATORY_CARE_PROVIDER_SITE_OTHER): Payer: Commercial Managed Care - HMO | Admitting: Pulmonary Disease

## 2015-03-31 ENCOUNTER — Encounter: Payer: Self-pay | Admitting: Pulmonary Disease

## 2015-03-31 ENCOUNTER — Other Ambulatory Visit (INDEPENDENT_AMBULATORY_CARE_PROVIDER_SITE_OTHER): Payer: Commercial Managed Care - HMO

## 2015-03-31 ENCOUNTER — Ambulatory Visit (INDEPENDENT_AMBULATORY_CARE_PROVIDER_SITE_OTHER)
Admission: RE | Admit: 2015-03-31 | Discharge: 2015-03-31 | Disposition: A | Discharge status: Home / Self Care | Payer: Commercial Managed Care - HMO | Source: Ambulatory Visit | Attending: Pulmonary Disease | Admitting: Pulmonary Disease

## 2015-03-31 VITALS — BP 118/72 | HR 69 | Temp 97.6°F | Ht 64.0 in | Wt 137.0 lb

## 2015-03-31 DIAGNOSIS — D62 Acute posthemorrhagic anemia: Secondary | ICD-10-CM

## 2015-03-31 DIAGNOSIS — E440 Moderate protein-calorie malnutrition: Secondary | ICD-10-CM

## 2015-03-31 DIAGNOSIS — J90 Pleural effusion, not elsewhere classified: Secondary | ICD-10-CM | POA: Diagnosis not present

## 2015-03-31 DIAGNOSIS — K922 Gastrointestinal hemorrhage, unspecified: Secondary | ICD-10-CM

## 2015-03-31 DIAGNOSIS — J810 Acute pulmonary edema: Secondary | ICD-10-CM | POA: Diagnosis not present

## 2015-03-31 DIAGNOSIS — J9601 Acute respiratory failure with hypoxia: Secondary | ICD-10-CM | POA: Diagnosis not present

## 2015-03-31 DIAGNOSIS — I4891 Unspecified atrial fibrillation: Secondary | ICD-10-CM

## 2015-03-31 DIAGNOSIS — I5020 Unspecified systolic (congestive) heart failure: Secondary | ICD-10-CM | POA: Insufficient documentation

## 2015-03-31 DIAGNOSIS — I10 Essential (primary) hypertension: Secondary | ICD-10-CM

## 2015-03-31 LAB — HEPATIC FUNCTION PANEL
ALK PHOS: 101 U/L (ref 39–117)
ALT: 12 U/L (ref 0–35)
AST: 17 U/L (ref 0–37)
Albumin: 3.8 g/dL (ref 3.5–5.2)
Bilirubin, Direct: 0.1 mg/dL (ref 0.0–0.3)
Total Bilirubin: 0.6 mg/dL (ref 0.2–1.2)
Total Protein: 7.2 g/dL (ref 6.0–8.3)

## 2015-03-31 LAB — IBC PANEL
Iron: 27 ug/dL — ABNORMAL LOW (ref 42–145)
Saturation Ratios: 6.4 % — ABNORMAL LOW (ref 20.0–50.0)
TRANSFERRIN: 302 mg/dL (ref 212.0–360.0)

## 2015-03-31 LAB — CBC WITH DIFFERENTIAL/PLATELET
BASOS PCT: 0.4 % (ref 0.0–3.0)
Basophils Absolute: 0 10*3/uL (ref 0.0–0.1)
EOS ABS: 0.3 10*3/uL (ref 0.0–0.7)
Eosinophils Relative: 3.6 % (ref 0.0–5.0)
HCT: 33.3 % — ABNORMAL LOW (ref 36.0–46.0)
Hemoglobin: 11.1 g/dL — ABNORMAL LOW (ref 12.0–15.0)
LYMPHS ABS: 1.5 10*3/uL (ref 0.7–4.0)
LYMPHS PCT: 16.8 % (ref 12.0–46.0)
MCHC: 33.2 g/dL (ref 30.0–36.0)
MCV: 84.5 fl (ref 78.0–100.0)
Monocytes Absolute: 0.7 10*3/uL (ref 0.1–1.0)
Monocytes Relative: 7.5 % (ref 3.0–12.0)
NEUTROS ABS: 6.5 10*3/uL (ref 1.4–7.7)
Neutrophils Relative %: 71.7 % (ref 43.0–77.0)
Platelets: 146 10*3/uL — ABNORMAL LOW (ref 150.0–400.0)
RBC: 3.94 Mil/uL (ref 3.87–5.11)
RDW: 14.7 % (ref 11.5–15.5)
WBC: 9 10*3/uL (ref 4.0–10.5)

## 2015-03-31 LAB — BASIC METABOLIC PANEL
BUN: 26 mg/dL — AB (ref 6–23)
CHLORIDE: 105 meq/L (ref 96–112)
CO2: 32 mEq/L (ref 19–32)
Calcium: 9.5 mg/dL (ref 8.4–10.5)
Creatinine, Ser: 1.13 mg/dL (ref 0.40–1.20)
GFR: 48.82 mL/min — AB (ref >60.00)
Glucose, Bld: 107 mg/dL — ABNORMAL HIGH (ref 70–99)
Potassium: 3.5 mEq/L (ref 3.5–5.1)
SODIUM: 145 meq/L (ref 135–145)

## 2015-03-31 LAB — BRAIN NATRIURETIC PEPTIDE: PRO B NATRI PEPTIDE: 265 pg/mL — AB (ref 0.0–100.0)

## 2015-03-31 LAB — TSH: TSH: 11.09 u[IU]/mL — ABNORMAL HIGH (ref 0.35–4.50)

## 2015-03-31 LAB — FERRITIN: Ferritin: 7 ng/mL — ABNORMAL LOW (ref 10.0–291.0)

## 2015-03-31 NOTE — Patient Instructions (Signed)
Today we updated your med list in our EPIC system...    Continue your current medications the same...    Med refills were written today...   Today we rechecked your CXR, EKG, and blood work...    We will contact you w/ the results when available...   Continue your oxygen for now...  Continue the PT/rehab at home...  Let's plan a follow up visit in 3 weeks, sooner if needed for problems.Marland KitchenMarland Kitchen

## 2015-04-01 ENCOUNTER — Encounter: Payer: Self-pay | Admitting: Pulmonary Disease

## 2015-04-01 NOTE — Progress Notes (Signed)
Subjective:     Patient ID: Tiffany [name], female   DOB: [birth year], 81 y.o.   MRN: [id number]  HPI 81 y/o WF, referred by Roxy Cedar, FNP for a pulmonary evaluation post hospitalization>     Tiffany [name] is here w/ her husb & an aide, she indicates good general medical health & prev notes from Primary Care indicates hx HBP, DJD & hx back surg 2012, memory loss/ anxiety/ depression/ insomnia;  One year ago 02/2014 she had episode of melena, GERD/ dysphagia, & wt loss- seen by Primary & GI-DrKaplan but stools were neg for blood & Hg=11-12 range, placed on PPI rx & followed...     She awoke 03/01/15 w/ hemetemesis/ BRB and was eval in ER=> Adm by DrOutlaw w/ CCM consult & Hosp 3/22 - 03/12/15> EGD showed a Dieulafoy lesion in cardia of the stomach which was clipped and she required transfusion (Hg down to 7), an ICU stay, intubated=>extubated=>Bipap, confusion, Afib=> converted to NSR on Metoprolol & Cards started Amio, systolicCHF, pulm edema/ effusions (thoracentesis was transudative), UTI treated w/ Cipro...     Since Disch 03/12/15 she has been receiving home health help & has an aide>        Acute hypoxemic resp failure, multifactorial> discharged on Oxygen at 2L/min flow continuously, not on any inhalers etc; last CXR 03/10/15 showed stable cardiomeg, diffuse pulm interstitial prominence, bibasilar atx/ ?LLL infiltrate, improved effusion, old right rib fxs;  CT Chest 03/08/15 showed norm hrt size & calcif in Thoracic Ao & coronaries, patchy GGO esp in LUL (r/o pneum), complete atx RLL/RML w/ large right effusion, smaller left effusion & basilar atx, old right rib fxs, splenomegaly, no signif lymphadenopathy...       Hx PAF in hosp 02/2015, combined sys&diast CHF> converted to NSR on BBlocker & disch on MetopER25-1/2 daily, Lasix40/d, Amio400-1/2Bid; 2DEcho 03/06/15 showed reduced LVF w/ EF=40-45% w/ HK, GrDD, mildMR, mild LAdil=35, mildly reduced RV sys function, modTR, mod incr PAsys=52;  Plan per  DrCrenshaw was to send her home on Amiodarone 200mg /d- we will decr her dose to this now (400mg tabs- 1/2 Qam)... she has an appt sched for 04/22/15 w/ Cherly Hensen...       GIB due to a Dieulafoy lesion in cardia of the stomach> this was clipped by DrOutlaw & she was disch on Protonix40mg  Bid (but it is not on her current med list), we will be sure she is taking this med...        Anemia> see below- GIB, Hg dropped to 7 & she was transfused (Bneg blood type); now Iron deficient & we will start FeSO4 daily & follow up...       Anxiety/ Depression/ Dementia> prev on Cymbalta, Trazadone; now on Klonopin 1mg Bid prn & Remeron15mg  Qhs (neither on her disch list of meds)...         EXAM showed Afeb, VSS, O2sat=99% on 2L/min by ;  HEENT- neg;  Chest- bibasilar rales, w/o wheezing/ rhonchi/ consolidation;  Heart- regular rhythm, rate70, gr1/6 SEM at LSB, no rubs or gallops heard;  Ext- VI, trace edema...   LABS reviewed> Cr=1.2-1.4 range on Lasix40, BNP=265;  TSH last checked 05/2014 was wnl but now TSH=11 on Amio200Bid since disch- rec to decr Amio to 200mg Qam & we will f/u Thyroid function...  LABS 03/31/15> Chems- ok w/ K=3.5 & supposed to be on K20/d;  CBC- Hg=11.1, MCV=84, Fe=27 (6%sat), Ferritin=7... She needed FeSO4 325mg  daily...   CXR 03/31/15 shows heart at upper lim of norm, much improved on  left, COPD changes and similar right base changes (sm effusion & incr markings), SQ nodule in left breast w/o change...   EKG 03/31/15 showed NSR, rate68, NSSTTWA...   IMP/PLAN>> not yet sure about underlying lung dis in the never smoker w/o prev hx lung problems other than pneumonia- for now continue the O2 7 we will reassess on return... From the cardiac standpoint- she has been on too much Amio (taking 200mg Bid) and needs to decr to 200mg  daily (1/2 of the 400mg  tab); also needs to take K20 daily, continue other meds the same for now; plan per DrCrenshaw was to continue Amio200/d for 8wks then stop if she is  maintaining NSR as she is now... She was disch on Protonix40Bid but it is not on her list 7 she didn't bring meds- we will be sure she is taking this PPI as directed by GI-DrOutlaw... Anemic and Fe defic- needs FeSO4 daily and we will follow up... ?HxDementia etc- she needs help w/ all meds & I don't think husb is capable, aide to supervise...    Past Medical History  Diagnosis Date  . Hypertension   . Arthritis   . Dementia   . Allergic rhinitis     Past Surgical History  Procedure Laterality Date  . Back surgery  09/25/2011  . Abdominal hysterectomy    . Esophagogastroduodenoscopy N/A 03/01/2015    Procedure: ESOPHAGOGASTRODUODENOSCOPY (EGD);  Surgeon: Arta Silence, MD;  Location: [facility];  Service: Endoscopy;  Laterality: N/A;  . Esophagogastroduodenoscopy N/A 03/02/2015    Procedure: ESOPHAGOGASTRODUODENOSCOPY (EGD);  Surgeon: Arta Silence, MD;  Location: [facility];  Service: Endoscopy;  Laterality: N/A;  bedside    Outpatient Encounter Prescriptions as of 03/31/2015  Medication Sig  . amiodarone (PACERONE) 400 MG tablet Take 0.5 tablets (200 mg total) by mouth 2 (two) times daily.  . clonazePAM (KLONOPIN) 1 MG tablet 1 mg 2 (two) times daily as needed.  . furosemide (LASIX) 40 MG tablet Take 1 tablet (40 mg total) by mouth daily.  . metoprolol succinate (TOPROL-XL) 25 MG 24 hr tablet Take 0.5 tablets (12.5 mg total) by mouth daily.  . mirtazapine (REMERON) 15 MG tablet Take 1 tablet (15 mg total) by mouth at bedtime.  . [DISCONTINUED] CRANBERRY PO Take 3,600 mg by mouth daily.  . [DISCONTINUED] Multiple Vitamin (MULTIVITAMIN) tablet Take 1 tablet by mouth daily.  . [DISCONTINUED] pantoprazole (PROTONIX) 40 MG tablet Take 1 tablet (40 mg total) by mouth 2 (two) times daily before a meal. (Patient not taking: Reported on 03/31/2015)  . [DISCONTINUED] potassium chloride SA (K-DUR,KLOR-CON) 20 MEQ tablet Take 2 tablets (40 mEq total) by mouth daily. (Patient not taking:  Reported on 03/31/2015)    No Known Allergies   Family History  Problem Relation Age of Onset  . Heart attack Brother   . Ovarian cancer Mother   . Colon cancer Sister   . Osteoarthritis Mother     History   Social History  . Marital Status: Married    Spouse Name: N/A  . Number of Children: 1  . Years of Education: N/A   Occupational History  . Retired    Social History Main Topics  . Smoking status: Never Smoker   . Smokeless tobacco: Never Used  . Alcohol Use: Yes     Comment: glass of wine per day  . Drug Use: No  . Sexual Activity: Not on file   Other Topics Concern  . Not on file   Social History Narrative  Current Medications, Allergies, Past Medical History, Past Surgical History, Family History, and Social History were reviewed in [employer] record.   Review of Systems              All symptoms NEG except where BOLDED >>  Constitutional:  Denies F/C/S, anorexia, unexpected weight change. HEENT:  No HA, visual changes, earache, nasal symptoms, sore throat, hoarseness. Resp:  No cough, sputum, hemoptysis; SOB, tightness, wheezing. Cardio:  CP, palpit, DOE, orthopnea, edema. GI:   Note some nausea, noV/D/C or blood in stool; no reflux, abd pain, distention, or gas. GU:  No dysuria, freq, urgency, hematuria, or flank pain. MS:  joint pain, swelling, tenderness, or decr ROM; no neck pain, back pain, etc. Neuro:  No tremors, seizures, dizziness, syncope, weakness, numbness, gait abn. Skin:  No suspicious lesions or skin rash. Heme:  No adenopathy, bruising, bleeding. Psyche: Denies confusion, sleep disturbance, hallucinations, anxiety, depression.   Objective:   Physical Exam    Vital Signs:  Reviewed...  General:  WD, WN, 81 y/o WF, chronically ill appearing in NAD; alert & cooperative but sl confused... HEENT:  [place]/AT; Conjunctiva- pink, Sclera- nonicteric, EOM-wnl, PERRLA, EACs-clear, TMs-wnl; NOSE-clear; THROAT-clear &  wnl. Neck:  Supple w/ decrROM; no JVD; normal carotid impulses w/o bruits; no thyromegaly or nodules palpated; no lymphadenopathy. Chest:  decr BS, bibasilar rales w/o wheezing, rhonchi, or signs of consolidation... Heart:  Regular Rhythm; gr1/6 SEM at LSB, no rubs or gallops detected. Abdomen:  Soft & nontender- no guarding or rebound; normal bowel sounds; no organomegaly or masses palpated. Ext:  decr ROM, +arthritic changes; no varicose veins, +venous insuffic, tr edema;  Pulses intact w/o bruits. Neuro:  CNs II-XII intact; motor testing normal; sensory testing normal; gait normal & balance OK. Derm:  No lesions noted; no rash etc. Lymph:  No cervical, supraclavicular, axillary, or inguinal adenopathy palpated.   Assessment:      Problem List>>  She is asked to remember to bring all meds to every visit...       Acute hypoxemic resp failure, multifactorial> continue oxygen for now...       Hx PAF in hosp 02/2015, combined sys&diast CHF> on Amio & we will decr to 200mg  Qam.       Abn TFTs w/ TSH=11 now> poss related to Amio, we will recheck lab & watch her clinically, may need Synthroid started soon.       GIB due to a Dieulafoy lesion in cardia of the stomach> we will write for the PPI- Protonix40mg  Bid        Anemia> she is Fe defic & we will start FeSO4 daily       Anxiety/ Depression/ Dementia> med list shows Klonopin & Remeron (?what she is actually taking)  IMP/PLAN>> not yet sure about underlying lung dis in the never smoker w/o prev hx lung problems other than pneumonia- for now continue the O2 7 we will reassess on return... From the cardiac standpoint- she has been on too much Amio (taking 200mg Bid) and needs to decr to 200mg  daily (1/2 of the 400mg  tab); also needs to take K20 daily, continue other meds the same for now; plan per DrCrenshaw was to continue Amio200/d for 8wks then stop if she is maintaining NSR as she is now... She was disch on Protonix40Bid but it is not on her list  7 she didn't bring meds- we will be sure she is taking this PPI as directed by GI-DrOutlaw... Anemic and Fe defic-  needs FeSO4 daily and we will follow up... ?HxDementia etc- she needs help w/ all meds & I don't think husb is capable, aide to supervise     Plan:     Patient's Medications  New Prescriptions        PANTOPRAZOLE 40mg -  take on tab twice daily about 30 min before breakfast & dinner...        FeSO4 325mg  OTC supplement-  take one tab daily...         K20-  Take one tab daily...   Previous Medications   AMIODARONE (PACERONE) 400 MG TABLET    Take 0.5 tablets (200 mg total) by mouth once  daily.   CLONAZEPAM (KLONOPIN) 1 MG TABLET    1 mg 2 (two) times daily as needed.   FUROSEMIDE (LASIX) 40 MG TABLET    Take 1 tablet (40 mg total) by mouth daily.   METOPROLOL SUCCINATE (TOPROL-XL) 25 MG 24 HR TABLET    Take 0.5 tablets (12.5 mg total) by mouth daily.   MIRTAZAPINE (REMERON) 15 MG TABLET    Take 1 tablet (15 mg total) by mouth at bedtime.  Modified Medications   No medications on file  Discontinued Medications   CRANBERRY PO    Take 3,600 mg by mouth daily.   MULTIPLE VITAMIN (MULTIVITAMIN) TABLET    Take 1 tablet by mouth daily.   PANTOPRAZOLE (PROTONIX) 40 MG TABLET    Take 1 tablet (40 mg total) by mouth 2 (two) times daily before a meal.   POTASSIUM CHLORIDE SA (K-DUR,KLOR-CON) 20 MEQ TABLET    Take 2 tablets (40 mEq total) by mouth daily.

## 2015-04-04 ENCOUNTER — Telehealth: Payer: Self-pay | Admitting: Pulmonary Disease

## 2015-04-04 MED ORDER — POTASSIUM CHLORIDE CRYS ER 20 MEQ PO TBCR: 20.0000 meq | EXTENDED_RELEASE_TABLET | Freq: Every day | ORAL | Status: DC

## 2015-04-04 NOTE — Telephone Encounter (Signed)
Spoke with pt and her nurse and advised of lab results per Dr Lenna Gilford.  Rx for Potassium sent to pharmacy.

## 2015-04-07 ENCOUNTER — Other Ambulatory Visit: Payer: Self-pay | Admitting: *Deleted

## 2015-04-07 ENCOUNTER — Telehealth: Payer: Self-pay | Admitting: Pulmonary Disease

## 2015-04-07 MED ORDER — FUROSEMIDE 40 MG PO TABS
40.0000 mg | ORAL_TABLET | Freq: Every day | ORAL | Status: DC
Start: 2015-04-07 — End: 2015-12-12

## 2015-04-07 NOTE — Telephone Encounter (Signed)
Spoke with pharmacy and after reviewing chart Amioderone rx should be sent to Dr Stanford Breed, Protonix rx should be sent to Dr Paulita Fujita, Lasix rx should be sent to Dr Megan Salon.  Maryan Rued only consulted on pt in the hospital.

## 2015-04-08 ENCOUNTER — Other Ambulatory Visit: Payer: Self-pay

## 2015-04-08 ENCOUNTER — Telehealth: Payer: Self-pay

## 2015-04-08 NOTE — Telephone Encounter (Signed)
Ok to refill amiodarone Patient was seen by Dr Debara Pickett and should f/u with him Not me as new patient

## 2015-04-11 ENCOUNTER — Ambulatory Visit (INDEPENDENT_AMBULATORY_CARE_PROVIDER_SITE_OTHER)
Admission: RE | Admit: 2015-04-11 | Discharge: 2015-04-11 | Disposition: A | Discharge status: Home / Self Care | Payer: Commercial Managed Care - HMO | Source: Ambulatory Visit | Attending: Family | Admitting: Family

## 2015-04-11 ENCOUNTER — Other Ambulatory Visit: Payer: Self-pay

## 2015-04-11 DIAGNOSIS — R410 Disorientation, unspecified: Secondary | ICD-10-CM | POA: Diagnosis not present

## 2015-04-11 MED ORDER — AMIODARONE HCL 400 MG PO TABS: 200.0000 mg | ORAL_TABLET | Freq: Two times a day (BID) | ORAL | Status: DC

## 2015-04-11 MED ORDER — PANTOPRAZOLE SODIUM 40 MG PO TBEC: 40.0000 mg | DELAYED_RELEASE_TABLET | Freq: Two times a day (BID) | ORAL | Status: DC

## 2015-04-14 ENCOUNTER — Telehealth: Payer: Self-pay | Admitting: Family

## 2015-04-14 NOTE — Telephone Encounter (Signed)
Pt was a no show for her referral appt today w/ [facility].

## 2015-04-14 NOTE — Telephone Encounter (Signed)
Pt's daughter forgot that she had an appointment. She will call and reschedule

## 2015-04-15 ENCOUNTER — Encounter: Payer: Self-pay | Admitting: Family Medicine

## 2015-04-20 ENCOUNTER — Encounter: Payer: Self-pay | Admitting: Internal Medicine

## 2015-04-20 ENCOUNTER — Ambulatory Visit (INDEPENDENT_AMBULATORY_CARE_PROVIDER_SITE_OTHER): Payer: Commercial Managed Care - HMO | Admitting: Internal Medicine

## 2015-04-20 VITALS — BP 98/68 | HR 54 | Ht 64.0 in | Wt 127.3 lb

## 2015-04-20 DIAGNOSIS — I4891 Unspecified atrial fibrillation: Secondary | ICD-10-CM

## 2015-04-20 DIAGNOSIS — J90 Pleural effusion, not elsewhere classified: Secondary | ICD-10-CM | POA: Diagnosis not present

## 2015-04-20 DIAGNOSIS — R0600 Dyspnea, unspecified: Secondary | ICD-10-CM | POA: Diagnosis not present

## 2015-04-20 DIAGNOSIS — I5020 Unspecified systolic (congestive) heart failure: Secondary | ICD-10-CM | POA: Diagnosis not present

## 2015-04-20 DIAGNOSIS — J849 Interstitial pulmonary disease, unspecified: Secondary | ICD-10-CM

## 2015-04-20 NOTE — Progress Notes (Signed)
OFFICE NOTE  Chief Complaint:  Hospital follow-up, short of breath  Primary Care Physician: Kennyth Arnold, FNP  HPI:  Tiffany [name] is an 81 y.o. female with a past medical history significant for hypertension, dyslipidemia, osteoarthritis, Raynaud's syndrome, and some degree of malnutrition, she presented with acute hematemesis consistent with a Dieulafoy lesion. This was successfully clipped with stabilization of her bleeding. She was transfused 2 units and H&H is now 10 and 30 up from 7 and 23. Last evening she went into A. fib with RVR. She was placed on diltiazem and amiodarone. Unfortunately she had persistent atrial fibrillation with rapid ventricular response. Ultimately she spontaneously converted back to sinus rhythm and it was felt that she was too high risk for anticoagulation. She was discharged on amiodarone and low-dose metoprolol. She was also found to have some degree of interstitial lung disease and has been on oxygen. In follow-up today she is maintaining a sinus bradycardia. Her main complaint is shortness of breath. She has a follow-up appoint with Dr. Lenna Gilford in pulmonary next week. Echo in March demonstrated a reduced EF of 40-45%. She also had a pleural effusion which was significant. She was diuresed and discharged on Lasix 40 mg daily. Since discharge her weight is been stable and she's had no worsening swelling. Blood pressure is noted to be low normal today at 98/68.  PMHx:  Past Medical History  Diagnosis Date  . Hypertension   . Arthritis   . Dementia   . Allergic rhinitis     Past Surgical History  Procedure Laterality Date  . Back surgery  09/25/2011  . Abdominal hysterectomy    . Esophagogastroduodenoscopy N/A 03/01/2015    Procedure: ESOPHAGOGASTRODUODENOSCOPY (EGD);  Surgeon: Arta Silence, MD;  Location: [facility];  Service: Endoscopy;  Laterality: N/A;  . Esophagogastroduodenoscopy N/A 03/02/2015    Procedure: ESOPHAGOGASTRODUODENOSCOPY  (EGD);  Surgeon: Arta Silence, MD;  Location: [facility];  Service: Endoscopy;  Laterality: N/A;  bedside    FAMHx:  Family History  Problem Relation Age of Onset  . Heart attack Brother   . Ovarian cancer Mother   . Colon cancer Sister   . Osteoarthritis Mother     SOCHx:   reports that she has never smoked. She has never used smokeless tobacco. She reports that she drinks alcohol. She reports that she does not use illicit drugs.  ALLERGIES:  No Known Allergies  ROS: A comprehensive review of systems was negative except for: Respiratory: positive for dyspnea on exertion  HOME MEDS: Current Outpatient Prescriptions  Medication Sig Dispense Refill  . clonazePAM (KLONOPIN) 1 MG tablet 1 mg 2 (two) times daily as needed.    . furosemide (LASIX) 40 MG tablet Take 1 tablet (40 mg total) by mouth daily. 30 tablet 5  . IRON PO Take by mouth daily.    . metoprolol succinate (TOPROL-XL) 25 MG 24 hr tablet Take 0.5 tablets (12.5 mg total) by mouth daily. 30 tablet 0  . mirtazapine (REMERON) 15 MG tablet Take 1 tablet (15 mg total) by mouth at bedtime. 30 tablet 0  . OXYGEN Inhale 3 L into the lungs continuous.    . pantoprazole (PROTONIX) 40 MG tablet Take 1 tablet (40 mg total) by mouth 2 (two) times daily. 180 tablet 1  . potassium chloride SA (K-DUR,KLOR-CON) 20 MEQ tablet Take 1 tablet (20 mEq total) by mouth daily. 30 tablet 6   No current facility-administered medications for this visit.    LABS/IMAGING: No results  found for this or any previous visit (from the past 48 hour(s)). No results found.  WEIGHTS: Wt Readings from Last 3 Encounters:  04/20/15 127 lb 4.8 oz (57.743 kg)  03/31/15 137 lb (62.143 kg)  03/17/15 122 lb 12.8 oz (55.702 kg)    VITALS: BP 98/68 mmHg  Pulse 54  Ht 5\' 4"  (1.626 m)  Wt 127 lb 4.8 oz (57.743 kg)  BMI 21.84 kg/m2  EXAM: General appearance: alert and no distress Neck: no carotid bruit and no JVD Lungs: rales bilaterally Heart:  regular rate and rhythm, S1, S2 normal, no murmur, click, rub or gallop Abdomen: soft, non-tender; bowel sounds normal; no masses,  no organomegaly Extremities: extremities normal, atraumatic, no cyanosis or edema Pulses: 2+ and symmetric Skin: distal cyanosis Neurologic: Mental status: Awake and oriented Psych: Pleasant  EKG: Sinus bradycardia 54  ASSESSMENT: 1. Paroxysmal atrial fibrillation-maintaining sinus bradycardia on amiodarone - CHADSVASC score 5 2. Nonischemic cardiomyopathy EF 40-45% 3. Interstitial lung disease - ongoing oxygen requirement 4. Recent life-threatening GI bleeding - not currently on anticoagulation  PLAN: 1.   Tiffany [name] had paroxysmal atrial fibrillation which may or may not a been related to her GI bleeding. It could also be a consequence of underlying lung disease. She had a cardiomyopathy with EF of 40-45%. Blood pressure is limited additional medication such as ACE inhibitor. She's currently on Toprol 12.5 mg daily heart rate is in the 50s which is about as much medicine initial be able to tolerate. I'm concerned about her underlying lung disease and ongoing oxygen requirements. At this point I would elect to take her off of amiodarone. Of course, if she has recurrence we need to consider antiarrhythmic therapy, especially if her EF remains low. I would like to recheck her EF by echo in 6 months. She does appear euvolemic on exam today and I would keep her on her current dose of Lasix 40 mg daily. She may need additional pulmonary treatment at her upcoming visit next week. We did discuss her risk of stroke being fairly high given her CHADSVASC score 5. Currently, she is not a good anticoagulation candidate due to her significant life-threatening GI bleed. Aspirin may provide little benefit and significantly increase her risk of leaving stomach ulcers. For now I would recommend keeping her off of anticoagulation, however she has recurrence of atrial fibrillation we  need to readdress the issue.  Pixie Casino, MD, [facility] Attending Cardiologist [place] 04/20/2015, 2:19 PM

## 2015-04-20 NOTE — Patient Instructions (Signed)
Your physician has recommended you make the following change in your medication: STOP amiodarone.   Your physician wants you to follow-up in: 3 months with Dr. Debara Pickett.

## 2015-04-21 ENCOUNTER — Other Ambulatory Visit: Payer: Self-pay | Admitting: Family

## 2015-04-22 ENCOUNTER — Ambulatory Visit: Payer: Commercial Managed Care - HMO | Admitting: Cardiovascular Disease

## 2015-04-25 ENCOUNTER — Encounter: Payer: Self-pay | Admitting: Pulmonary Disease

## 2015-04-25 ENCOUNTER — Ambulatory Visit (INDEPENDENT_AMBULATORY_CARE_PROVIDER_SITE_OTHER): Payer: Commercial Managed Care - HMO | Admitting: Pulmonary Disease

## 2015-04-25 ENCOUNTER — Ambulatory Visit (INDEPENDENT_AMBULATORY_CARE_PROVIDER_SITE_OTHER)
Admission: RE | Admit: 2015-04-25 | Discharge: 2015-04-25 | Disposition: A | Discharge status: Home / Self Care | Payer: Commercial Managed Care - HMO | Source: Ambulatory Visit | Attending: Pulmonary Disease | Admitting: Pulmonary Disease

## 2015-04-25 VITALS — BP 102/70 | HR 60 | Temp 97.4°F | Wt 126.2 lb

## 2015-04-25 DIAGNOSIS — K922 Gastrointestinal hemorrhage, unspecified: Secondary | ICD-10-CM

## 2015-04-25 DIAGNOSIS — I5020 Unspecified systolic (congestive) heart failure: Secondary | ICD-10-CM

## 2015-04-25 DIAGNOSIS — D62 Acute posthemorrhagic anemia: Secondary | ICD-10-CM

## 2015-04-25 DIAGNOSIS — J9601 Acute respiratory failure with hypoxia: Secondary | ICD-10-CM

## 2015-04-25 DIAGNOSIS — I4891 Unspecified atrial fibrillation: Secondary | ICD-10-CM

## 2015-04-25 DIAGNOSIS — R413 Other amnesia: Secondary | ICD-10-CM

## 2015-04-25 DIAGNOSIS — R0600 Dyspnea, unspecified: Secondary | ICD-10-CM

## 2015-04-25 NOTE — Progress Notes (Signed)
Subjective:     Patient ID: Tiffany [name], female   DOB: 1929/01/02, 81 y.o.   MRN: [id number]  HPI 81 y/o WF, referred by Roxy Cedar, FNP for a pulmonary evaluation post hospitalization>    Tiffany [name] is here w/ her husb & an aide, she indicates good general medical health & prev notes from Primary Care indicates hx HBP, DJD & hx back surg 2012, memory loss/ anxiety/ depression/ insomnia;  One year ago 02/2014 she had episode of melena, GERD/ dysphagia, & wt loss- seen by Primary & GI-DrKaplan but stools were neg for blood & Hg=11-12 range, placed on PPI rx & followed...     She awoke 03/01/15 w/ hemetemesis/ BRB and was eval in ER=> Adm by DrOutlaw w/ CCM consult & Hosp 3/22 - 03/12/15> EGD showed a Dieulafoy lesion in cardia of the stomach which was clipped and she required transfusion (Hg down to 7), an ICU stay, intubated=>extubated=>Bipap, confusion, Afib=> converted to NSR on Metoprolol & Cards started Amio, systolicCHF, pulm edema/ effusions (thoracentesis was transudative), UTI treated w/ Cipro...     Since Disch 03/12/15 she has been receiving home health help & has an aide>        Acute hypoxemic resp failure, multifactorial> discharged on Oxygen at 2L/min flow continuously, not on any inhalers etc; last CXR 03/10/15 showed stable cardiomeg, diffuse pulm interstitial prominence, bibasilar atx/ ?LLL infiltrate, improved effusion, old right rib fxs;  CT Chest 03/08/15 showed norm hrt size & calcif in Thoracic Ao & coronaries, patchy GGO esp in LUL (r/o pneum), complete atx RLL/RML w/ large right effusion, smaller left effusion & basilar atx, old right rib fxs, splenomegaly, no signif lymphadenopathy...       Hx PAF in hosp 02/2015, combined sys&diast CHF> converted to NSR on BBlocker & disch on MetopER25-1/2 daily, Lasix40/d, Amio400-1/2Bid; 2DEcho 03/06/15 showed reduced LVF w/ EF=40-45% w/ HK, GrDD, mildMR, mild LAdil=35, mildly reduced RV sys function, modTR, mod incr PAsys=52;  Plan per  DrCrenshaw was to send her home on Amiodarone 200mg /d- we will decr her dose to this now (400mg tabs- 1/2 Qam)... she has an appt sched for 04/22/15 w/ Cherly Hensen...       GIB due to a Dieulafoy lesion in cardia of the stomach> this was clipped by DrOutlaw & she was disch on Protonix40mg  Bid (but it is not on her current med list), we will be sure she is taking this med...        Anemia> see below- GIB, Hg dropped to 7 & she was transfused (Bneg blood type); now Iron deficient & we will start FeSO4 daily & follow up...       Anxiety/ Depression/ Dementia> prev on Cymbalta, Trazadone; now on Klonopin 1mg Bid prn & Remeron15mg  Qhs (neither on her disch list of meds)...              EXAM showed Afeb, VSS, O2sat=99% on 2L/min by [place];  HEENT- neg;  Chest- bibasilar rales, w/o wheezing/ rhonchi/ consolidation;  Heart- regular rhythm, rate70, gr1/6 SEM at LSB, no rubs or gallops heard;  Ext- VI, trace edema...   LABS reviewed> Cr=1.2-1.4 range on Lasix40, BNP=265;  TSH last checked 05/2014 was wnl but now TSH=11 on Amio200Bid since disch- rec to decr Amio to 200mg Qam & we will f/u Thyroid function...  LABS 03/31/15> Chems- ok w/ K=3.5 & supposed to be on K20/d;  CBC- Hg=11.1, MCV=84, Fe=27 (6%sat), Ferritin=7... She needed FeSO4 325mg  daily...   CXR 03/31/15 shows heart at upper lim of  norm, much improved on left, COPD changes and similar right base changes (sm effusion & incr markings), SQ nodule in left breast w/o change...   EKG 03/31/15 showed NSR, rate68, NSSTTWA...  IMP/PLAN>> not yet sure about underlying lung dis in the never smoker w/o prev hx lung problems other than pneumonia- for now continue the O2 & we will reassess on return... From the cardiac standpoint- she has been on too much Amio (taking 200mg Bid) and needs to decr to 200mg  daily (1/2 of the 400mg  tab); also needs to take K20 daily, continue other meds the same for now; plan per DrCrenshaw was to continue Amio200/d for 8wks then stop if she is  maintaining NSR as she is now... She was disch on Protonix40Bid but it is not on her list & she didn't bring meds- we will be sure she is taking this PPI as directed by GI-DrOutlaw... Anemic and Fe defic- needs FeSO4 daily and we will follow up... ?HxDementia etc- she needs help w/ all meds & I don't think husb is capable, aide to supervise...   ~  Apr 25, 2015:  3-4wk ROV & recheck> Tiffany [name] is doing better overall- she saw [facility]/20/15 & he stopped her Amiodarone & continued the Lasix40 (he also endorsed no anticoagulation due to her severe GIB); since she was here last she has lost 11# (mostly edema) down to 126# today & she states feeling well, resting well, etc; she wants off the oxygen; they have help at home & family calls daily, visits 2-3x per week & places all meds in pill box w/ supervision; family confirms she is doing much better...       EXAM shows Afeb, VSS (BP=102/70); O2sat=100% on 3L on arrival;  HEENT- neg;  Chest- clear, sl decr BS at bases, no w/r/r;  Cor- regular, gr1/6 SEM w/o r/g;  Ext- +VI, no c/c/e...  CXR 04/25/15 showed cardiomeg, the right effusion is resolved & the lungs are clear now, mult old right rib fxs and stable calcif nodule in left breast...  Ambulatory Oxygen Test today on RA> O2sat=94% on RA at rest w/ pulse=63/min; she walked 1 lap & stopped because she was "tired"; lowest O2sat=96% w/ pulse=74/min... IMP/PLAN>> she does not want the oxygen around her home (we will request [employer] to pick up her concentrator) her edema has cleared, CHF improved, and she is approaching baseline; she needs increased exercise program & perhaps DrHilty would consider [facility]? She has f/u appts sched w/ DrJaffe, Neuro 6/2 and PCP on 6/20... Follow up w/ [place] can be prn.    Past Medical History  Diagnosis Date  . Hypertension   . Arthritis   . Dementia   . Allergic rhinitis     Past Surgical History  Procedure Laterality Date  . Back surgery  09/25/2011  . Abdominal  hysterectomy    . Esophagogastroduodenoscopy N/A 03/01/2015    Procedure: ESOPHAGOGASTRODUODENOSCOPY (EGD);  Surgeon: Arta Silence, MD;  Location: [facility];  Service: Endoscopy;  Laterality: N/A;  . Esophagogastroduodenoscopy N/A 03/02/2015    Procedure: ESOPHAGOGASTRODUODENOSCOPY (EGD);  Surgeon: Arta Silence, MD;  Location: [facility];  Service: Endoscopy;  Laterality: N/A;  bedside    Outpatient Encounter Prescriptions as of 04/25/2015  Medication Sig  . clonazePAM (KLONOPIN) 1 MG tablet 1 mg 2 (two) times daily as needed.  . furosemide (LASIX) 40 MG tablet Take 1 tablet (40 mg total) by mouth daily.  . IRON PO Take by mouth daily.  . metoprolol succinate (TOPROL-XL) 25 MG 24  hr tablet Take 0.5 tablets (12.5 mg total) by mouth daily.  . mirtazapine (REMERON) 15 MG tablet TAKE 1 TABLET (15 MG TOTAL) BY MOUTH AT BEDTIME.  Marland Kitchen OXYGEN Inhale 3 L into the lungs continuous.  . pantoprazole (PROTONIX) 40 MG tablet Take 1 tablet (40 mg total) by mouth 2 (two) times daily.  . potassium chloride SA (K-DUR,KLOR-CON) 20 MEQ tablet Take 1 tablet (20 mEq total) by mouth daily.   No facility-administered encounter medications on file as of 04/25/2015.    No Known Allergies   Family History  Problem Relation Age of Onset  . Heart attack Brother   . Ovarian cancer Mother   . Colon cancer Sister   . Osteoarthritis Mother     History   Social History  . Marital Status: Married    Spouse Name: N/A  . Number of Children: 1  . Years of Education: N/A   Occupational History  . Retired    Social History Main Topics  . Smoking status: Never Smoker   . Smokeless tobacco: Never Used  . Alcohol Use: Yes     Comment: glass of wine per day  . Drug Use: No  . Sexual Activity: Not on file   Other Topics Concern  . Not on file   Social History Narrative    Current Medications, Allergies, Past Medical History, Past Surgical History, Family History, and Social History were reviewed in  [employer] record.   Review of Systems              All symptoms NEG except where BOLDED >>  Constitutional:  Denies F/C/S, anorexia, unexpected weight change. HEENT:  No HA, visual changes, earache, nasal symptoms, sore throat, hoarseness. Resp:  No cough, sputum, hemoptysis; SOB, tightness, wheezing. Cardio:  CP, palpit, DOE, orthopnea, edema. GI:   Note some nausea, noV/D/C or blood in stool; no reflux, abd pain, distention, or gas. GU:  No dysuria, freq, urgency, hematuria, or flank pain. MS:  joint pain, swelling, tenderness, or decr ROM; no neck pain, back pain, etc. Neuro:  No tremors, seizures, dizziness, syncope, weakness, numbness, gait abn. Skin:  No suspicious lesions or skin rash. Heme:  No adenopathy, bruising, bleeding. Psyche: Denies confusion, sleep disturbance, hallucinations, anxiety, depression.   Objective:   Physical Exam    Vital Signs:  Reviewed...  General:  WD, WN, 81 y/o WF, chronically ill appearing in NAD; alert & cooperative but sl confused... HEENT:  Stella/AT; Conjunctiva- pink, Sclera- nonicteric, EOM-wnl, PERRLA, EACs-clear, TMs-wnl; NOSE-clear; THROAT-clear & wnl. Neck:  Supple w/ decrROM; no JVD; normal carotid impulses w/o bruits; no thyromegaly or nodules palpated; no lymphadenopathy. Chest:  decr BS, bibasilar rales w/o wheezing, rhonchi, or signs of consolidation... Heart:  Regular Rhythm; gr1/6 SEM at LSB, no rubs or gallops detected. Abdomen:  Soft & nontender- no guarding or rebound; normal bowel sounds; no organomegaly or masses palpated. Ext:  decr ROM, +arthritic changes; no varicose veins, +venous insuffic, tr edema;  Pulses intact w/o bruits. Neuro:  CNs II-XII intact; motor testing normal; sensory testing normal; gait normal & balance OK. Derm:  No lesions noted; no rash etc. Lymph:  No cervical, supraclavicular, axillary, or inguinal adenopathy palpated.   Assessment:      Problem List>>  She is asked to  remember to bring all meds to every visit...       Acute hypoxemic resp failure, multifactorial> her pulm edema has resolved & she is down 11# w/ resolution of interstitial  edema & right effusion; O2 sats are now WNL at rest & w/ exercise- ok to DC the O2...       Hx PAF in hosp 02/2015, combined sys&diast CHF> off Am[facility] now per [facility] & continuing on Lasix40/d       Abn TFTs w/ TSH=11 now> poss related to Amio, she will need to have the TSH rechecked by her PCP or Cards in follow up...       GIB due to a Dieulafoy lesion in cardia of the stomach> we will write for the PPI- Protonix40mg  Bid, she has f/u w/ PCP & GI planned...       Anemia> she is Fe defic & started on Fe- to be followed by PCP...       Anxiety/ Depression/ Dementia> med list shows Klonopin & Remeron (?what she is actually taking)  IMP/PLAN>> she does not want the oxygen around her home (we will request [employer] to pick up her concentrator) her edema has cleared, CHF improved, and she is approaching baseline; she needs increased exercise program & perhaps DrHilty would consider [facility]? She has f/u appts sched w/ DrJaffe, Neuro 6/2 and PCP on 6/20... Follow up w/ [place] can be prn.     Plan:     Patient's Medications  New Prescriptions   No medications on file  Previous Medications   CLONAZEPAM (KLONOPIN) 1 MG TABLET    1 mg 2 (two) times daily as needed.   FUROSEMIDE (LASIX) 40 MG TABLET    Take 1 tablet (40 mg total) by mouth daily.   IRON PO    Take by mouth daily.   METOPROLOL SUCCINATE (TOPROL-XL) 25 MG 24 HR TABLET    Take 0.5 tablets (12.5 mg total) by mouth daily.   MIRTAZAPINE (REMERON) 15 MG TABLET    TAKE 1 TABLET (15 MG TOTAL) BY MOUTH AT BEDTIME.   OXYGEN    Inhale 3 L into the lungs continuous.   PANTOPRAZOLE (PROTONIX) 40 MG TABLET    Take 1 tablet (40 mg total) by mouth 2 (two) times daily.   POTASSIUM CHLORIDE SA (K-DUR,KLOR-CON) 20 MEQ TABLET    Take 1 tablet (20 mEq total) by mouth daily.  Modified Medications    No medications on file  Discontinued Medications   No medications on file

## 2015-04-25 NOTE — Patient Instructions (Signed)
Today we updated your med list in our EPIC system...    Continue your current medications the same...  Today we did your follow up CXR...    We will contact you w/ the results when available...   Your ambulatory oxygen test was great- you no longer need the oxygen 7 we will have [facility] pick up their equipment...  Call for any questions or if we can be of service in any way.Marland KitchenMarland Kitchen

## 2015-05-06 ENCOUNTER — Other Ambulatory Visit: Payer: Self-pay | Admitting: *Deleted

## 2015-05-06 MED ORDER — METOPROLOL SUCCINATE ER 25 MG PO TB24: 12.5000 mg | ORAL_TABLET | Freq: Every day | ORAL | Status: DC

## 2015-05-11 ENCOUNTER — Ambulatory Visit: Payer: Commercial Managed Care - HMO | Admitting: Neurology

## 2015-05-12 ENCOUNTER — Encounter: Payer: Self-pay | Admitting: Neurology

## 2015-05-12 ENCOUNTER — Ambulatory Visit (INDEPENDENT_AMBULATORY_CARE_PROVIDER_SITE_OTHER): Payer: Commercial Managed Care - HMO | Admitting: Neurology

## 2015-05-12 VITALS — BP 130/66 | HR 68 | Resp 18 | Ht 64.0 in | Wt 129.3 lb

## 2015-05-12 DIAGNOSIS — G309 Alzheimer's disease, unspecified: Secondary | ICD-10-CM

## 2015-05-12 DIAGNOSIS — F0280 Dementia in other diseases classified elsewhere without behavioral disturbance: Secondary | ICD-10-CM | POA: Diagnosis not present

## 2015-05-12 LAB — VITAMIN B12: VITAMIN B 12: 697 pg/mL (ref 211–911)

## 2015-05-12 MED ORDER — MEMANTINE HCL ER 7 & 14 & 21 &28 MG PO CP24: ORAL_CAPSULE | ORAL | Status: DC

## 2015-05-12 NOTE — Patient Instructions (Signed)
1.  We will start memantine ER (Namenda XR) 7mg  tablets.  We will increase dose to goal of 28mg  daily, as per the following schedule.  Start 1 tablet daily for one week, then 2 tablets daily for one week, then 3 tablets daily for one week.  At that point, call the clinic and we can prescribe a larger single dose tablet of 28mg , to be taken daily.  Side effects include dizziness, headache, diarrhea or constipation.  Call with any questions or concerns. 2.  Will give information about Alzheimer's support groups 3.  Limit driving only to the grocery store down the street and during daylight hours. 4.  Socialize.  Participate in group activities and exercise classes. 5.  Follow up in 6 months.

## 2015-05-12 NOTE — Progress Notes (Signed)
NEUROLOGY CONSULTATION NOTE  Tiffany [name] MRN: [id number] DOB: 01-15-1929  Referring provider: Dutch Quint, FNP Primary care provider: Dutch Quint, FNP  Reason for consult:  dementia  HISTORY OF PRESENT ILLNESS: Tiffany [name] is an 81 year old right-handed woman with hypertension, dyslipidemia, Raynaud's, dementia and recent upper GI bleed and a-fib who presents for dementia.  Records, labs and CT of head reviewed.  She is accompanied by her daughter who provides some history.  Her family first noticed memory problems about 4 years ago, presenting as repeating questions or statements.  It has gradually progressed, and has significantly progressed since she had a GI bleed back in March.  She exhibits some paranoid delusions.  She feels that people are out to do her wrong.  She feels that people yell at her, steal from her or try to cheat her.  She will accuse anybody anywhere, from her husband to people working at the grocery store.  She, herself, is not combative.  She does not have any hallucinations.  She is still able to pay the bills correctly and perform activities of daily living.  She still drives, but only to the grocery store down the street.  She maintains her hygiene.  She is not incontinent.  She is not depressed.  She is no longer able to remember to take her medications, so her daughter sets it up for her.    For insomnia, she was prescribed Remeron, which has helped her sleep.  She has no known family history of dementia.  She is a Engineer, building services.  She lives with her 74 year old husband, who handles most of the cooking.  She makes sure he does not leave the stove on.  They do not usually use the stove anyway.  TSH from April was 11.09.  It is thought to possibly be secondary to amiodarone, which was decreased and will be rechecked.  CT of head from last month showed mild age-related volume loss.  PAST MEDICAL HISTORY: Past Medical History  Diagnosis Date  .  Hypertension   . Arthritis   . Dementia   . Allergic rhinitis     PAST SURGICAL HISTORY: Past Surgical History  Procedure Laterality Date  . Back surgery  09/25/2011  . Abdominal hysterectomy    . Esophagogastroduodenoscopy N/A 03/01/2015    Procedure: ESOPHAGOGASTRODUODENOSCOPY (EGD);  Surgeon: Arta Silence, MD;  Location: [facility];  Service: Endoscopy;  Laterality: N/A;  . Esophagogastroduodenoscopy N/A 03/02/2015    Procedure: ESOPHAGOGASTRODUODENOSCOPY (EGD);  Surgeon: Arta Silence, MD;  Location: [facility];  Service: Endoscopy;  Laterality: N/A;  bedside    MEDICATIONS: Current Outpatient Prescriptions on File Prior to Visit  Medication Sig Dispense Refill  . furosemide (LASIX) 40 MG tablet Take 1 tablet (40 mg total) by mouth daily. 30 tablet 5  . IRON PO Take by mouth daily.    . metoprolol succinate (TOPROL-XL) 25 MG 24 hr tablet Take 0.5 tablets (12.5 mg total) by mouth daily. 15 tablet 6  . mirtazapine (REMERON) 15 MG tablet TAKE 1 TABLET (15 MG TOTAL) BY MOUTH AT BEDTIME. 90 tablet 1  . OXYGEN Inhale 3 L into the lungs continuous.    . pantoprazole (PROTONIX) 40 MG tablet Take 1 tablet (40 mg total) by mouth 2 (two) times daily. 180 tablet 1  . potassium chloride SA (K-DUR,KLOR-CON) 20 MEQ tablet Take 1 tablet (20 mEq total) by mouth daily. 30 tablet 6  . clonazePAM (KLONOPIN) 1 MG tablet 1 mg 2 (two)  times daily as needed.     No current facility-administered medications on file prior to visit.    ALLERGIES: No Known Allergies  FAMILY HISTORY: Family History  Problem Relation Age of Onset  . Heart attack Brother   . Ovarian cancer Mother   . Colon cancer Sister   . Osteoarthritis Mother     SOCIAL HISTORY: History   Social History  . Marital Status: Married    Spouse Name: N/A  . Number of Children: 1  . Years of Education: N/A   Occupational History  . Retired    Social History Main Topics  . Smoking status: Never Smoker   . Smokeless  tobacco: Never Used  . Alcohol Use: 0.0 oz/week    0 Standard drinks or equivalent per week     Comment: glass of wine per day  . Drug Use: No  . Sexual Activity: No   Other Topics Concern  . Not on file   Social History Narrative    REVIEW OF SYSTEMS: Constitutional: No fevers, chills, or sweats, no generalized fatigue, change in appetite Eyes: No visual changes, double vision, eye pain Ear, nose and throat: No hearing loss, ear pain, nasal congestion, sore throat Cardiovascular: No chest pain, palpitations Respiratory:  No shortness of breath at rest or with exertion, wheezes GastrointestinaI: No nausea, vomiting, diarrhea, abdominal pain, fecal incontinence Genitourinary:  No dysuria, urinary retention or frequency Musculoskeletal:  No neck pain, back pain Integumentary: No rash, pruritus, skin lesions Neurological: as above Psychiatric: No depression, insomnia, anxiety Endocrine: No palpitations, fatigue, diaphoresis, mood swings, change in appetite, change in weight, increased thirst Hematologic/Lymphatic:  No anemia, purpura, petechiae. Allergic/Immunologic: no itchy/runny eyes, nasal congestion, recent allergic reactions, rashes  PHYSICAL EXAM: Filed Vitals:   05/12/15 1032  BP: 130/66  Pulse: 68  Resp: 18   General: No acute distress Head:  Normocephalic/atraumatic Eyes:  fundi unremarkable, without vessel changes, exudates, hemorrhages or papilledema. Neck: supple, no paraspinal tenderness, full range of motion Back: No paraspinal tenderness Heart: regular rate and rhythm Lungs: Clear to auscultation bilaterally. Vascular: No carotid bruits. Neurological Exam: Mental status: alert and oriented to person, place, and time, delayed recall poor, remote memory intact, fund of knowledge intact, attention and concentration intact, speech fluent and not dysarthric, language with minor deficits in naming and naming fluency. Montreal Cognitive Assessment  05/12/2015    Visuospatial/ Executive (0/5) 3  Naming (0/3) 1  Attention: Read list of digits (0/2) 1  Attention: Read list of letters (0/1) 1  Attention: Serial 7 subtraction starting at 100 (0/3) 3  Language: Repeat phrase (0/2) 2  Language : Fluency (0/1) 0  Abstraction (0/2) 1  Delayed Recall (0/5) 0  Orientation (0/6) 3  Total 15  Adjusted Score (based on education) 16   Cranial nerves: CN I: not tested CN II: pupils equal, round and reactive to light, visual fields intact, fundi unremarkable, without vessel changes, exudates, hemorrhages or papilledema. CN III, IV, VI:  full range of motion, no nystagmus, no ptosis CN V: facial sensation intact CN VII: upper and lower face symmetric CN VIII: hearing intact CN IX, X: gag intact, uvula midline CN XI: sternocleidomastoid and trapezius muscles intact CN XII: tongue midline Bulk & Tone: normal, no fasciculations. Motor:  5/5 throughout Sensation:  Temperature and vibration intact Deep Tendon Reflexes:  2+ throughout, toes downgoing Finger to nose testing:  No dysmetria Heel to shin:  No dysmetria Gait:  Normal station and stride.  Mildly unsteady with  tandem walking. Romberg with sway.  IMPRESSION: Alzheimer's dementia  PLAN: 1.  Due to her GI bleed, we will not start Aricept.  Instead, we will start Namenda XR, titrating to 28mg  daily 2.  Will give info on support groups and day centers to remain active 3.  Will check B12 4.  Limit driving to only daylight hours and only to the grocery store. 5.  Follow up in 6 months.  Thank you for allowing me to take part in the care of this patient.  Metta Clines, DO  CC: Dutch Quint, FNP

## 2015-05-17 ENCOUNTER — Telehealth: Payer: Self-pay | Admitting: Pulmonary Disease

## 2015-05-17 DIAGNOSIS — J849 Interstitial pulmonary disease, unspecified: Secondary | ICD-10-CM

## 2015-05-17 NOTE — Telephone Encounter (Signed)
lmomtcb x1 

## 2015-05-18 ENCOUNTER — Telehealth: Payer: Self-pay | Admitting: Neurology

## 2015-05-18 NOTE — Telephone Encounter (Signed)
lmtcb X2 for [employer]

## 2015-05-18 NOTE — Telephone Encounter (Signed)
Please advise 

## 2015-05-18 NOTE — Telephone Encounter (Signed)
-----   Message from Cindra Eves sent at 05/18/2015 12:22 AM EDT ----- Call pt with result, document via telephone encounter.  ----- Message -----    From: Pieter Partridge, DO    Sent: 05/13/2015   6:45 AM      To: Melissa Noon, CMA  B12 is normal

## 2015-05-18 NOTE — Telephone Encounter (Signed)
Called to refer to [facility]. Tiffany [name] will call to set up an eval.

## 2015-05-18 NOTE — Telephone Encounter (Signed)
Can we refer her to a day program

## 2015-05-18 NOTE — Telephone Encounter (Signed)
Spoke w/ patient's daughter, [name] this morning and made aware of patient's B12 results. Daughter verbalizes understanding and will share results with her mother. She says she is waiting to hear back from Dr. Georgie Chard nurse regarding recommendations to a day program. She discussed it with him during the patient's visit and he was going to research it and have the nurse call with information. Please call [name] with this information.

## 2015-05-19 NOTE — Telephone Encounter (Signed)
Called and spoke to pt's daughter. Pt needing d/c O2 order as one has not been placed yet. Order placed. Pt's daughter verbalized understanding and denied any further questions or concerns at this time.      Patient Instructions     Today we updated your med list in our EPIC system...  Continue your current medications the same...  Today we did your follow up CXR...  We will contact you w/ the results when available...   Your ambulatory oxygen test was great- you no longer need the oxygen 7 we will have [facility] pick up their equipment...  Call for any questions or if we can be of service in any way.Marland KitchenMarland Kitchen

## 2015-05-27 ENCOUNTER — Telehealth: Payer: Self-pay | Admitting: *Deleted

## 2015-05-27 NOTE — Telephone Encounter (Signed)
Patients daughter call to let Dr. Tomi Likens know she is doing really well on her Namenda  Its ok to sent a Rx to the [facility]

## 2015-05-30 ENCOUNTER — Ambulatory Visit: Payer: Commercial Managed Care - HMO | Admitting: Family

## 2015-05-30 ENCOUNTER — Other Ambulatory Visit: Payer: Self-pay | Admitting: *Deleted

## 2015-05-30 MED ORDER — MEMANTINE HCL ER 28 MG PO CP24: 28.0000 mg | ORAL_CAPSULE | Freq: Every day | ORAL | Status: DC

## 2015-05-30 NOTE — Telephone Encounter (Signed)
Namenda HCI ER 28 mg 1 po qd # 30 with3 refills  Sent to pharmacy

## 2015-06-01 ENCOUNTER — Ambulatory Visit (INDEPENDENT_AMBULATORY_CARE_PROVIDER_SITE_OTHER): Payer: Commercial Managed Care - HMO | Admitting: Family

## 2015-06-01 ENCOUNTER — Encounter: Payer: Self-pay | Admitting: Family

## 2015-06-01 VITALS — BP 118/60 | HR 81 | Temp 98.1°F | Ht 64.0 in | Wt 131.0 lb

## 2015-06-01 DIAGNOSIS — I10 Essential (primary) hypertension: Secondary | ICD-10-CM

## 2015-06-01 DIAGNOSIS — K219 Gastro-esophageal reflux disease without esophagitis: Secondary | ICD-10-CM

## 2015-06-01 DIAGNOSIS — F0390 Unspecified dementia without behavioral disturbance: Secondary | ICD-10-CM

## 2015-06-01 DIAGNOSIS — E875 Hyperkalemia: Secondary | ICD-10-CM

## 2015-06-01 NOTE — Progress Notes (Signed)
Pre visit review using our clinic review tool, if applicable. No additional management support is needed unless otherwise documented below in the visit note. 

## 2015-06-01 NOTE — Progress Notes (Signed)
Subjective:    Patient ID: Tiffany [name], female    DOB: 05-17-1929, 81 y.o.   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 She recently started Namenda. Daughter feels like she is doing much better and is finally stabilized. She's under the care of cardiology who she saw in April that is stable.   Review of Systems  Constitutional: Negative.   HENT: Negative.   Respiratory: Negative.   Cardiovascular: Negative.   Gastrointestinal: Negative.   Endocrine: Negative.   Genitourinary: Negative.   Musculoskeletal: Negative.   Skin: Negative.   Allergic/Immunologic: Negative.   Neurological: Negative.   Hematological: Negative.   Psychiatric/Behavioral: Negative.   All other systems reviewed and are negative.  Past Medical History  Diagnosis Date  . Hypertension   . Arthritis   . Dementia   . Allergic rhinitis     History   Social History  . Marital Status: Married    Spouse Name: N/A  . Number of Children: 1  . Years of Education: N/A   Occupational History  . Retired    Social History Main Topics  . Smoking status: Never Smoker   . Smokeless tobacco: Never Used  . Alcohol Use: 0.0 oz/week    0 Standard drinks or equivalent per week     Comment: glass of wine per day  . Drug Use: No  . Sexual Activity: No   Other Topics Concern  . Not on file   Social History Narrative    Past Surgical History  Procedure Laterality Date  . Back surgery  09/25/2011  . Abdominal hysterectomy    . Esophagogastroduodenoscopy N/A 03/01/2015    Procedure: ESOPHAGOGASTRODUODENOSCOPY (EGD);  Surgeon: Arta Silence, MD;  Location: [facility];  Service: Endoscopy;  Laterality: N/A;  . Esophagogastroduodenoscopy N/A 03/02/2015    Procedure: ESOPHAGOGASTRODUODENOSCOPY (EGD);  Surgeon: Arta Silence, MD;  Location: [facility];   Service: Endoscopy;  Laterality: N/A;  bedside    Family History  Problem Relation Age of Onset  . Heart attack Brother   . Ovarian cancer Mother   . Colon cancer Sister   . Osteoarthritis Mother     No Known Allergies  Current Outpatient Prescriptions on File Prior to Visit  Medication Sig Dispense Refill  . clonazePAM (KLONOPIN) 1 MG tablet 1 mg 2 (two) times daily as needed.    . furosemide (LASIX) 40 MG tablet Take 1 tablet (40 mg total) by mouth daily. 30 tablet 5  . IRON PO Take by mouth daily.    . memantine (NAMENDA XR) 28 MG CP24 24 hr capsule Take 1 capsule (28 mg total) by mouth daily. 30 capsule 3  . metoprolol succinate (TOPROL-XL) 25 MG 24 hr tablet Take 0.5 tablets (12.5 mg total) by mouth daily. 15 tablet 6  . mirtazapine (REMERON) 15 MG tablet TAKE 1 TABLET (15 MG TOTAL) BY MOUTH AT BEDTIME. 90 tablet 1  . OXYGEN Inhale 3 L into the lungs continuous.    . pantoprazole (PROTONIX) 40 MG tablet Take 1 tablet (40 mg total) by mouth 2 (two) times daily. 180 tablet 1  . Potassium Chloride ER 20 MEQ TBCR     . potassium chloride SA (K-DUR,KLOR-CON) 20 MEQ tablet Take 1 tablet (20 mEq total) by mouth daily. 30 tablet 6   No current facility-administered medications on file prior to visit.    BP  118/60 mmHg  Pulse 81  Temp(Src) 98.1 F (36.7 C) (Oral)  Ht 5\' 4"  (1.626 m)  Wt 131 lb (59.421 kg)  BMI 22.47 kg/m2chart     Objective:   Physical Exam  Constitutional: She is oriented to person, place, and time. She appears well-developed and well-nourished.  HENT:  Right Ear: External ear normal.  Left Ear: External ear normal.  Nose: Nose normal.  Mouth/Throat: Oropharynx is clear and moist.  Neck: Normal range of motion. Neck supple.  Cardiovascular: Normal rate, regular rhythm and normal heart sounds.   Pulmonary/Chest: Effort normal and breath sounds normal.  Abdominal: Soft. Bowel sounds are normal.  Musculoskeletal: Normal range of motion.  Neurological: She  is alert and oriented to person, place, and time. She has normal reflexes.  Skin: Skin is warm and dry.  Psychiatric: She has a normal mood and affect.          Assessment & Plan:  Yunuen was seen today for follow-up.  Diagnoses and all orders for this visit:  Essential hypertension  Dementia, without behavioral disturbance  Gastroesophageal reflux disease without esophagitis   call the office with any questions or concerns. Recheck in 6 months and sooner as needed.

## 2015-06-01 NOTE — Patient Instructions (Signed)
Alzheimer Disease Caregiver Guide Alzheimer disease is an illness that affects a person's brain. It causes a person to lose the ability to remember things and make good decisions. As the disease progresses, the person is unable to take care of himself or herself and needs more and more help to do simple tasks. Taking care of someone with Alzheimer disease can be very challenging and overwhelming.  MEMORY LOSS AND CONFUSION Memory loss and confusion is mild in the beginning stages of the disease. Both of these problems become more severe as the disease progresses. Eventually, the person will not recognize places or even close family members and friends.   Stay calm.  Respond with a short explanation. Long explanations can be overwhelming and confusing.  Avoid corrections that sound like scolding.  Try not to take it personally, even if the person forgets your name. BEHAVIOR CHANGES Behavior changes are part of the disease. The person may develop depression, anxiety, anger, hallucinations, or other behavior changes. These changes can come on suddenly and may be in response to pain, infection, changes in the environment (temperature, noise), overstimulation, or feeling lost or scared.   Try not to take behavior changes personally.  Remain calm and patient.  Do not argue or try to convince the person about a specific point. This will only make him or her more agitated.  Know that the behavior changes are part of the disease process and try to work through it. TIPS TO REDUCE FRUSTRATION  Schedule wisely by making appointments and doing daily tasks, like bathing and dressing, when the person is at his or her best.  Take your time. Simple tasks may take a lot longer, so be sure to allow for plenty of time.  Limit choices. Too many choices can be overwhelming and stressful for the person.  Involve the person in what you are doing.  Stick to a routine.  Avoid new or crowded situations, if  possible.  Use simple words, short sentences, and a calm voice. Only give one direction at a time.  Buy clothes and shoes that are easy to put on and take off.  Let people help if they offer. HOME SAFETY Keeping the home safe is very important to reduce the risk of falls and injuries.   Keep floors clear of clutter. Remove rugs, magazine racks, and floor lamps.  Keep hallways well lit.  Put a handrail and nonslip mat in the bathtub or shower.  Put childproof locks on cabinets with dangerous items, such as medicine, alcohol, guns, toxic cleaning items, sharp tools or utensils, matches, or lighters.  Place locks on doors where the person cannot easily see or reach them. This helps ensure that the person cannot wander out of the house and get lost.  Be prepared for emergencies. Keep a list of emergency phone numbers and addresses in a convenient area. PLANS FOR THE FUTURE  Do not put off talking about finances.  Talk about money management. People with Alzheimer disease have trouble managing their money as the disease gets worse.  Get help from professional advisors regarding financial and legal matters.  Do not put off talking about future care.  Choose a power of attorney. This is someone who can make decisions for the person with Alzheimer disease when he or she is no longer able to do so.  Talk about driving and when it is the right time to stop. The person's health care provider can help give advice on this matter.  Talk about   the person's living situation. If he or she lives alone, you need to make sure he or she is safe. Some people need extra help at home, and others need more care at a nursing home or care center. SUPPORT GROUPS Joining a support group can be very helpful for caregivers of people with Alzheimer disease. Some advantages to being part of a support group include:   Getting strategies to manage stress.  Sharing experiences with others.  Receiving  emotional comfort and support.  Learning new caregiving skills as the disease progresses.  Knowing what community resources are available and taking advantage of them. SEEK MEDICAL CARE IF:  The person has a fever.  The person has a sudden change in behavior that does not improve with calming strategies.  The person is unable to manage in his or her current living situation.  The person threatens you or anyone else, including himself or herself.  You are no longer able to care for the person. Document Released: 08/07/2004 Document Revised: 04/12/2014 Document Reviewed: 01/02/2012 ExitCare Patient Information 2015 [employer]. This information is not intended to replace advice given to you by your health care provider. Make sure you discuss any questions you have with your health care provider.  

## 2015-06-06 ENCOUNTER — Other Ambulatory Visit: Payer: Self-pay

## 2015-06-22 ENCOUNTER — Telehealth: Payer: Self-pay | Admitting: Family

## 2015-06-22 NOTE — Telephone Encounter (Signed)
Spoke to [place] (daughter/on [employer]) and informed her that I have spoken to a physician in the office.  Physician is advising that no appointment be made and that the pt go to the ED for treatment.  Nikki agreed but seem reluctant.  Will continue to monitor to see if pt goes to ED.

## 2015-06-22 NOTE — Telephone Encounter (Signed)
[facility] - Client [place] [facility] Patient Name: SAYGE BRIENZA DOB: [birth year] Initial Comment Caller states her mother has been sick for the last 2 days. Had blood in her stool and has become discolored. Wanting to schedule an appt Nurse Assessment Nurse: Markus Daft, RN, Sherre Poot Date/Time Eilene Ghazi Time): 06/22/2015 11:35:43 AM Confirm and document reason for call. If symptomatic, describe symptoms. ---Caller states her mother has been sick for the last 2 days - skin looks "pale green". She has had blood in her stool and has become discolored. c/o nausea. No vomiting. Has the patient traveled out of the country within the last 30 days? ---Not Applicable Does the patient require triage? ---Yes Related visit to physician within the last 2 weeks? ---No Does the PT have any chronic conditions? (i.e. diabetes, asthma, etc.) ---Yes List chronic conditions. ---Alzheimer's; GI bleed 02/2015; HTN; mild CHF; COPD Guidelines Guideline Title Affirmed Question Affirmed Notes Rectal Bleeding Severe dizziness (e.g., unable to stand, requires support to walk, feels like passing out now) Final Disposition User Go to ED Now Markus Daft, RN, Sherre Poot Disagree/Comply: Disagree Disagree/Comply Reason: Disagree with instructions Caller wants an appt at office.  RN spoke with [facility] office staff and made her aware.

## 2015-06-23 ENCOUNTER — Emergency Department (HOSPITAL_COMMUNITY): Payer: Commercial Managed Care - HMO

## 2015-06-23 ENCOUNTER — Inpatient Hospital Stay (HOSPITAL_COMMUNITY)
Admission: EM | Admit: 2015-06-23 | Discharge: 2015-06-28 | DRG: 378 | Disposition: A | Discharge status: Home Health | Payer: Commercial Managed Care - HMO | Attending: Internal Medicine | Admitting: Internal Medicine

## 2015-06-23 ENCOUNTER — Encounter (HOSPITAL_COMMUNITY): Payer: Self-pay | Admitting: Neurology

## 2015-06-23 DIAGNOSIS — I5020 Unspecified systolic (congestive) heart failure: Secondary | ICD-10-CM | POA: Diagnosis not present

## 2015-06-23 DIAGNOSIS — K219 Gastro-esophageal reflux disease without esophagitis: Secondary | ICD-10-CM | POA: Diagnosis present

## 2015-06-23 DIAGNOSIS — Z791 Long term (current) use of non-steroidal anti-inflammatories (NSAID): Secondary | ICD-10-CM | POA: Diagnosis not present

## 2015-06-23 DIAGNOSIS — M549 Dorsalgia, unspecified: Secondary | ICD-10-CM | POA: Diagnosis present

## 2015-06-23 DIAGNOSIS — F329 Major depressive disorder, single episode, unspecified: Secondary | ICD-10-CM | POA: Diagnosis present

## 2015-06-23 DIAGNOSIS — K648 Other hemorrhoids: Secondary | ICD-10-CM | POA: Diagnosis present

## 2015-06-23 DIAGNOSIS — I5022 Chronic systolic (congestive) heart failure: Secondary | ICD-10-CM | POA: Diagnosis present

## 2015-06-23 DIAGNOSIS — E860 Dehydration: Secondary | ICD-10-CM | POA: Diagnosis present

## 2015-06-23 DIAGNOSIS — D62 Acute posthemorrhagic anemia: Secondary | ICD-10-CM | POA: Diagnosis present

## 2015-06-23 DIAGNOSIS — D649 Anemia, unspecified: Secondary | ICD-10-CM | POA: Diagnosis present

## 2015-06-23 DIAGNOSIS — F0280 Dementia in other diseases classified elsewhere without behavioral disturbance: Secondary | ICD-10-CM | POA: Diagnosis present

## 2015-06-23 DIAGNOSIS — I129 Hypertensive chronic kidney disease with stage 1 through stage 4 chronic kidney disease, or unspecified chronic kidney disease: Secondary | ICD-10-CM | POA: Diagnosis present

## 2015-06-23 DIAGNOSIS — I5032 Chronic diastolic (congestive) heart failure: Secondary | ICD-10-CM | POA: Insufficient documentation

## 2015-06-23 DIAGNOSIS — N183 Chronic kidney disease, stage 3 (moderate): Secondary | ICD-10-CM | POA: Diagnosis present

## 2015-06-23 DIAGNOSIS — Q2733 Arteriovenous malformation of digestive system vessel: Secondary | ICD-10-CM

## 2015-06-23 DIAGNOSIS — K3182 Dieulafoy lesion (hemorrhagic) of stomach and duodenum: Secondary | ICD-10-CM | POA: Diagnosis present

## 2015-06-23 DIAGNOSIS — J309 Allergic rhinitis, unspecified: Secondary | ICD-10-CM | POA: Diagnosis present

## 2015-06-23 DIAGNOSIS — R0902 Hypoxemia: Secondary | ICD-10-CM | POA: Diagnosis present

## 2015-06-23 DIAGNOSIS — I10 Essential (primary) hypertension: Secondary | ICD-10-CM | POA: Diagnosis not present

## 2015-06-23 DIAGNOSIS — K922 Gastrointestinal hemorrhage, unspecified: Secondary | ICD-10-CM | POA: Diagnosis present

## 2015-06-23 DIAGNOSIS — N179 Acute kidney failure, unspecified: Secondary | ICD-10-CM | POA: Diagnosis present

## 2015-06-23 DIAGNOSIS — Z79899 Other long term (current) drug therapy: Secondary | ICD-10-CM | POA: Diagnosis not present

## 2015-06-23 DIAGNOSIS — Z8719 Personal history of other diseases of the digestive system: Secondary | ICD-10-CM

## 2015-06-23 DIAGNOSIS — G309 Alzheimer's disease, unspecified: Secondary | ICD-10-CM | POA: Diagnosis present

## 2015-06-23 LAB — CBC
HCT: 24.4 % — ABNORMAL LOW (ref 36.0–46.0)
HEMOGLOBIN: 7.4 g/dL — AB (ref 12.0–15.0)
MCH: 28.8 pg (ref 26.0–34.0)
MCHC: 30.3 g/dL (ref 30.0–36.0)
MCV: 94.9 fL (ref 78.0–100.0)
Platelets: 152 10*3/uL (ref 150–400)
RBC: 2.57 MIL/uL — AB (ref 3.87–5.11)
RDW: 17.8 % — AB (ref 11.5–15.5)
WBC: 7.9 10*3/uL (ref 4.0–10.5)

## 2015-06-23 LAB — BASIC METABOLIC PANEL
ANION GAP: 9 (ref 5–15)
BUN: 48 mg/dL — ABNORMAL HIGH (ref 6–20)
CHLORIDE: 111 mmol/L (ref 101–111)
CO2: 25 mmol/L (ref 22–32)
Calcium: 9 mg/dL (ref 8.9–10.3)
Creatinine, Ser: 1.67 mg/dL — ABNORMAL HIGH (ref 0.44–1.00)
GFR calc Af Amer: 32 mL/min — ABNORMAL LOW (ref >60)
GFR, EST NON AFRICAN AMERICAN: 27 mL/min — AB (ref >60)
GLUCOSE: 126 mg/dL — AB (ref 65–99)
Potassium: 4.8 mmol/L (ref 3.5–5.1)
Sodium: 145 mmol/L (ref 135–145)

## 2015-06-23 LAB — I-STAT TROPONIN, ED: TROPONIN I, POC: 0.01 ng/mL (ref 0.00–0.08)

## 2015-06-23 MED ORDER — PANTOPRAZOLE SODIUM 40 MG IV SOLR
40.0000 mg | Freq: Two times a day (BID) | INTRAVENOUS | Status: DC
Start: 2015-06-27 — End: 2015-06-25

## 2015-06-23 MED ORDER — SODIUM CHLORIDE 0.9 % IJ SOLN
3.0000 mL | Freq: Two times a day (BID) | INTRAMUSCULAR | Status: DC
  Administered 2015-06-24 – 2015-06-28 (×8): 3 mL via INTRAVENOUS

## 2015-06-23 MED ORDER — MEMANTINE HCL ER 28 MG PO CP24
28.0000 mg | ORAL_CAPSULE | Freq: Every day | ORAL | Status: DC
  Administered 2015-06-24 – 2015-06-28 (×5): 28 mg via ORAL
  Filled 2015-06-23 (×5): qty 1

## 2015-06-23 MED ORDER — SODIUM CHLORIDE 0.9 % IV SOLN
80.0000 mg | Freq: Once | INTRAVENOUS | Status: AC
  Administered 2015-06-24: 80 mg via INTRAVENOUS
  Filled 2015-06-23: qty 80

## 2015-06-23 MED ORDER — METOPROLOL SUCCINATE 12.5 MG HALF TABLET
12.5000 mg | ORAL_TABLET | Freq: Every day | ORAL | Status: DC
  Administered 2015-06-24 – 2015-06-28 (×5): 12.5 mg via ORAL
  Filled 2015-06-23 (×5): qty 1

## 2015-06-23 MED ORDER — SODIUM CHLORIDE 0.9 % IV SOLN
8.0000 mg/h | INTRAVENOUS | Status: DC
  Administered 2015-06-24 – 2015-06-25 (×2): 8 mg/h via INTRAVENOUS
  Filled 2015-06-23 (×7): qty 80

## 2015-06-23 MED ORDER — PANTOPRAZOLE SODIUM 40 MG PO TBEC: 40.0000 mg | DELAYED_RELEASE_TABLET | Freq: Two times a day (BID) | ORAL | Status: DC

## 2015-06-23 MED ORDER — SODIUM CHLORIDE 0.9 % IV BOLUS (SEPSIS)
1000.0000 mL | Freq: Once | INTRAVENOUS | Status: AC
  Administered 2015-06-23: 1000 mL via INTRAVENOUS

## 2015-06-23 MED ORDER — SODIUM CHLORIDE 0.9 % IV SOLN
Freq: Once | INTRAVENOUS | Status: AC
  Administered 2015-06-25: 11:00:00 via INTRAVENOUS

## 2015-06-23 MED ORDER — MIRTAZAPINE 15 MG PO TABS
15.0000 mg | ORAL_TABLET | Freq: Every day | ORAL | Status: DC
  Administered 2015-06-24 – 2015-06-27 (×5): 15 mg via ORAL
  Filled 2015-06-23 (×6): qty 1

## 2015-06-23 MED ORDER — ACETAMINOPHEN 500 MG PO TABS
500.0000 mg | ORAL_TABLET | Freq: Four times a day (QID) | ORAL | Status: DC | PRN
  Administered 2015-06-25: 500 mg via ORAL
  Filled 2015-06-23: qty 1

## 2015-06-23 NOTE — ED Provider Notes (Signed)
I saw and evaluated the patient, reviewed the resident's note and I agree with the findings and plan.   EKG Interpretation   Date/Time:  Thursday June 23 2015 16:18:44 EDT Ventricular Rate:  71 PR Interval:  184 QRS Duration: 84 QT Interval:  474 QTC Calculation: 515 R Axis:   -18 Text Interpretation:  Normal sinus rhythm Possible Left atrial enlargement  Septal infarct , age undetermined ST \\T \ T wave abnormality, consider  inferior ischemia Prolonged QT Abnormal ECG Confirmed by Zenia Resides  MD,  Tomasa Dobransky ([id number]) on 06/23/2015 7:58:02 PM     Patient here complaining of dizziness and pain. Stools are guaiac positive. Blood transfusion ordered and she'll be admitted for symptom anemia as well as lower GI bleed  Lacretia Leigh, MD 06/23/15 2228

## 2015-06-23 NOTE — ED Provider Notes (Signed)
CSN: [id number]     Arrival date & time 06/23/15  1546 History   First MD Initiated Contact with Patient 06/23/15 2000     Chief Complaint  Patient presents with  . Dizziness     (Consider location/radiation/quality/duration/timing/severity/associated sxs/prior Treatment) HPI Patient is an 81 year old female with a history of hypertension, dementia, GI bleed presenting for dizziness and back pain. Over the past 3 days she has had increasing back pain and multiple episodes of dizziness. She reports no frank syncopal episodes, chest pain, shortness of breath, or falls. She takes iron supplements and reports dark-colored stools at this time. Denies any hemoptysis. Back pain is nonexertional and nonreproducible with palpation.  Back pain aching in nature, with no alleviating factors, and rated as a 5/10 at the peak.  Past Medical History  Diagnosis Date  . Hypertension   . Arthritis   . Dementia   . Allergic rhinitis    Past Surgical History  Procedure Laterality Date  . Back surgery  09/25/2011  . Abdominal hysterectomy    . Esophagogastroduodenoscopy N/A 03/01/2015    Procedure: ESOPHAGOGASTRODUODENOSCOPY (EGD);  Surgeon: Arta Silence, MD;  Location: [facility];  Service: Endoscopy;  Laterality: N/A;  . Esophagogastroduodenoscopy N/A 03/02/2015    Procedure: ESOPHAGOGASTRODUODENOSCOPY (EGD);  Surgeon: Arta Silence, MD;  Location: [facility];  Service: Endoscopy;  Laterality: N/A;  bedside   Family History  Problem Relation Age of Onset  . Heart attack Brother   . Ovarian cancer Mother   . Colon cancer Sister   . Osteoarthritis Mother    History  Substance Use Topics  . Smoking status: Never Smoker   . Smokeless tobacco: Never Used  . Alcohol Use: 0.0 oz/week    0 Standard drinks or equivalent per week     Comment: glass of wine per day   OB History    No data available     Review of Systems  Constitutional: Negative for fever and chills.  HENT: Negative for  congestion and sore throat.   Eyes: Negative for pain.  Respiratory: Negative for cough and shortness of breath.   Cardiovascular: Negative for chest pain, palpitations and leg swelling.  Gastrointestinal: Negative for nausea, vomiting, abdominal pain and diarrhea.  Genitourinary: Negative for dysuria and flank pain.  Musculoskeletal: Positive for back pain. Negative for neck pain.  Skin: Negative for rash.  Allergic/Immunologic: Negative.   Neurological: Positive for dizziness and light-headedness. Negative for syncope.  Psychiatric/Behavioral: Negative for confusion.      Allergies  Review of patient's allergies indicates no known allergies.  Home Medications   Prior to Admission medications   Medication Sig Start Date End Date Taking? Authorizing Provider  acetaminophen (TYLENOL) 500 MG tablet Take 500 mg by mouth every 6 (six) hours as needed for mild pain, moderate pain or headache.   Yes Historical Provider, MD  Carboxymethylcellulose Sodium (LUBRICANT EYE DROPS OP) Place 1 drop into both eyes daily as needed (dry eyes).  03/23/15  Yes Historical Provider, MD  furosemide (LASIX) 40 MG tablet Take 1 tablet (40 mg total) by mouth daily. 04/07/15  Yes Kennyth Arnold, FNP  IRON PO Take 1 tablet by mouth daily.    Yes Historical Provider, MD  memantine (NAMENDA XR) 28 MG CP24 24 hr capsule Take 1 capsule (28 mg total) by mouth daily. 05/30/15  Yes Pieter Partridge, DO  metoprolol succinate (TOPROL-XL) 25 MG 24 hr tablet Take 0.5 tablets (12.5 mg total) by mouth daily. 05/06/15  Yes Nadean Corwin  Hilty, MD  mirtazapine (REMERON) 15 MG tablet TAKE 1 TABLET (15 MG TOTAL) BY MOUTH AT BEDTIME. 04/21/15  Yes Kennyth Arnold, FNP  pantoprazole (PROTONIX) 40 MG tablet Take 1 tablet (40 mg total) by mouth 2 (two) times daily. 04/11/15  Yes Kennyth Arnold, FNP  potassium chloride SA (K-DUR,KLOR-CON) 20 MEQ tablet Take 1 tablet (20 mEq total) by mouth daily. 04/04/15  Yes Noralee Space, MD   BP 123/45 mmHg   Pulse 70  Temp(Src) 98.3 F (36.8 C) (Oral)  Resp 16  Ht 5\' 2"  (1.575 m)  Wt 135 lb 12.8 oz (61.598 kg)  BMI 24.83 kg/m2  SpO2 96% Physical Exam  Constitutional: She is oriented to person, place, and time. She appears well-developed and well-nourished. No distress.  HENT:  Head: Normocephalic and atraumatic.  Eyes: Conjunctivae and EOM are normal. Pupils are equal, round, and reactive to light.  Neck: Normal range of motion. Neck supple.  Cardiovascular: Normal rate, regular rhythm and normal heart sounds.   Pulses:      Radial pulses are 2+ on the right side, and 2+ on the left side.  Pulmonary/Chest: Effort normal and breath sounds normal. No respiratory distress.  Abdominal: Soft. Bowel sounds are normal. There is no tenderness.  Genitourinary: Rectal exam shows no external hemorrhoid, no fissure and anal tone normal. Guaiac positive stool.  Musculoskeletal: Normal range of motion.  Neurological: She is alert and oriented to person, place, and time. She has normal reflexes. No cranial nerve deficit.  Skin: Skin is warm and dry. She is not diaphoretic.  Psychiatric: She has a normal mood and affect.    ED Course  Procedures (including critical care time) Labs Review Labs Reviewed  BASIC METABOLIC PANEL - Abnormal; Notable for the following:    Glucose, Bld 126 (*)    BUN 48 (*)    Creatinine, Ser 1.67 (*)    GFR calc non Af Amer 27 (*)    GFR calc Af Amer 32 (*)    All other components within normal limits  CBC - Abnormal; Notable for the following:    RBC 2.57 (*)    Hemoglobin 7.4 (*)    HCT 24.4 (*)    RDW 17.8 (*)    All other components within normal limits  PROTIME-INR  APTT  PROTIME-INR  APTT  BASIC METABOLIC PANEL  CBC  I-STAT TROPOININ, ED  I-STAT CG4 LACTIC ACID, ED  POC OCCULT BLOOD, ED  I-STAT CG4 LACTIC ACID, ED  TYPE AND SCREEN  TYPE AND SCREEN  PREPARE RBC (CROSSMATCH)  PREPARE RBC (CROSSMATCH)    Imaging Review Dg Chest 2 View  06/23/2015    CLINICAL DATA:  Shortness of breath with weakness and lethargy  EXAM: CHEST  2 VIEW  COMPARISON:  Apr 25, 2015  FINDINGS: There is no edema or consolidation. The heart is upper normal in size with pulmonary vascularity within normal limits. No adenopathy. There is atherosclerotic change in aorta. There is evidence of old healed right 6, seventh, and eighth rib fractures. There is an exostosis arising from the anterior left first rib. There is degenerative change in thoracic spine.  There are surgical clips in the left upper abdomen.  IMPRESSION: No edema or consolidation.   Electronically Signed   By: Lowella Grip III M.D.   On: 06/23/2015 17:17     EKG Interpretation   Date/Time:  Thursday June 23 2015 16:18:44 EDT Ventricular Rate:  71 PR Interval:  184 QRS Duration:  84 QT Interval:  474 QTC Calculation: 515 R Axis:   -18 Text Interpretation:  Normal sinus rhythm Possible Left atrial enlargement  Septal infarct , age undetermined ST \\T \ T wave abnormality, consider  inferior ischemia Prolonged QT Abnormal ECG Confirmed by ALLEN  MD,  ANTHONY ([id number]) on 06/23/2015 7:58:02 PM      MDM   Final diagnoses:  Anemia, unspecified anemia type    On initial evaluation patient was hemodynamically stable and in no acute distress. Labs displayed Hgb 7.4, CR 1.67, BUN 48. Hemoccult positive with no abdominal pain. Patient given bolus in the ED and type and screen for 2 units blood transfusion. Unknown etiology of back pain at this time. Pain not consistent with aortic dissection. No widened mediastinum and equal pulses on exam. Do not feel scan necessary at this time. No other cardiac pathology identified on chest x-ray. Troponin negative. EKG with T-wave abnormalities and prolonged QTC. Hospitalist Dr. Alcario Drought consulted for admission of the patient for blood transfusion and GI consultation in the morning.  If performed, labs, EKGs, and imaging were reviewed/interpreted by myself and my  attending and incorporated into medical decision making.  Discussed pertinent finding with patient or caregiver prior to admission with no further questions.  Pt care supervised by my attending Dr. Gearlean Alf, MD PGY-2  Emergency Medicine     Geronimo Boot, MD 06/24/15 [phone]

## 2015-06-23 NOTE — ED Notes (Signed)
Transporting patient to new room assignment. 

## 2015-06-23 NOTE — ED Notes (Signed)
Dr. Allen at the bedside.  

## 2015-06-23 NOTE — ED Notes (Signed)
EDP at bedside  

## 2015-06-23 NOTE — H&P (Addendum)
[facility] History and Physical  MILA PAIR EEF:[id number] DOB: [birth year] DOA: 06/23/2015  Referring physician: EDP PCP: Kennyth Arnold, FNP   Chief Complaint: Dizziness   HPI: Tiffany [name] is a 81 y.o. female with h/o GI bleed from dieulafoy lesion of stomach in March of this year.  Patient presents to the ED with 3 day history of fatigue, generalized weakness, dizziness, non-specific back pain.  Patient has had black stool ever since she started iron pills, does not note any changes to her stool recently though.  Today in ED her HGB was 7.4 down from 11.1 last checked in April.  Her stool is hemoccult positive.  Review of Systems: Systems reviewed.  As above, otherwise negative  Past Medical History  Diagnosis Date  . Hypertension   . Arthritis   . Dementia   . Allergic rhinitis    Past Surgical History  Procedure Laterality Date  . Back surgery  09/25/2011  . Abdominal hysterectomy    . Esophagogastroduodenoscopy N/A 03/01/2015    Procedure: ESOPHAGOGASTRODUODENOSCOPY (EGD);  Surgeon: Arta Silence, MD;  Location: [facility];  Service: Endoscopy;  Laterality: N/A;  . Esophagogastroduodenoscopy N/A 03/02/2015    Procedure: ESOPHAGOGASTRODUODENOSCOPY (EGD);  Surgeon: Arta Silence, MD;  Location: [facility];  Service: Endoscopy;  Laterality: N/A;  bedside   Social History:  reports that she has never smoked. She has never used smokeless tobacco. She reports that she drinks alcohol. She reports that she does not use illicit drugs.  No Known Allergies  Family History  Problem Relation Age of Onset  . Heart attack Brother   . Ovarian cancer Mother   . Colon cancer Sister   . Osteoarthritis Mother      Prior to Admission medications   Medication Sig Start Date End Date Taking? Authorizing Provider  acetaminophen (TYLENOL) 500 MG tablet Take 500 mg by mouth every 6 (six) hours as needed for mild pain, moderate pain or headache.   Yes Historical  Provider, MD  Carboxymethylcellulose Sodium (LUBRICANT EYE DROPS OP) Place 1 drop into both eyes daily as needed (dry eyes).  03/23/15  Yes Historical Provider, MD  furosemide (LASIX) 40 MG tablet Take 1 tablet (40 mg total) by mouth daily. 04/07/15  Yes Kennyth Arnold, FNP  IRON PO Take 1 tablet by mouth daily.    Yes Historical Provider, MD  memantine (NAMENDA XR) 28 MG CP24 24 hr capsule Take 1 capsule (28 mg total) by mouth daily. 05/30/15  Yes Pieter Partridge, DO  metoprolol succinate (TOPROL-XL) 25 MG 24 hr tablet Take 0.5 tablets (12.5 mg total) by mouth daily. 05/06/15  Yes Pixie Casino, MD  mirtazapine (REMERON) 15 MG tablet TAKE 1 TABLET (15 MG TOTAL) BY MOUTH AT BEDTIME. 04/21/15  Yes Kennyth Arnold, FNP  pantoprazole (PROTONIX) 40 MG tablet Take 1 tablet (40 mg total) by mouth 2 (two) times daily. 04/11/15  Yes Kennyth Arnold, FNP  potassium chloride SA (K-DUR,KLOR-CON) 20 MEQ tablet Take 1 tablet (20 mEq total) by mouth daily. 04/04/15  Yes Noralee Space, MD   Physical Exam: Filed Vitals:   06/23/15 2303  BP: 144/54  Pulse: 70  Temp:   Resp: 17    BP 144/54 mmHg  Pulse 70  Temp(Src) 98 F (36.7 C) (Oral)  Resp 17  SpO2 100%  General Appearance:    Alert, oriented, no distress, appears stated age  Head:    Normocephalic, atraumatic  Eyes:    PERRL, EOMI, sclera  non-icteric        Nose:   Nares without drainage or epistaxis. Mucosa, turbinates normal  Throat:   Moist mucous membranes. Oropharynx without erythema or exudate.  Neck:   Supple. No carotid bruits.  No thyromegaly.  No lymphadenopathy.   Back:     No CVA tenderness, no spinal tenderness  Lungs:     Clear to auscultation bilaterally, without wheezes, rhonchi or rales  Chest wall:    No tenderness to palpitation  Heart:    Regular rate and rhythm without murmurs, gallops, rubs  Abdomen:     Soft, non-tender, nondistended, normal bowel sounds, no organomegaly  Genitalia:    deferred  Rectal:    deferred   Extremities:   No clubbing, cyanosis or edema.  Pulses:   2+ and symmetric all extremities  Skin:   Skin color, texture, turgor normal, no rashes or lesions  Lymph nodes:   Cervical, supraclavicular, and axillary nodes normal  Neurologic:   CNII-XII intact. Normal strength, sensation and reflexes      throughout    Labs on Admission:  Basic Metabolic Panel:  Recent Labs Lab 06/23/15 1622  NA 145  K 4.8  CL 111  CO2 25  GLUCOSE 126*  BUN 48*  CREATININE 1.67*  CALCIUM 9.0   Liver Function Tests: No results for input(s): AST, ALT, ALKPHOS, BILITOT, PROT, ALBUMIN in the last 168 hours. No results for input(s): LIPASE, AMYLASE in the last 168 hours. No results for input(s): AMMONIA in the last 168 hours. CBC:  Recent Labs Lab 06/23/15 1622  WBC 7.9  HGB 7.4*  HCT 24.4*  MCV 94.9  PLT 152   Cardiac Enzymes: No results for input(s): CKTOTAL, CKMB, CKMBINDEX, TROPONINI in the last 168 hours.  BNP (last 3 results)  Recent Labs  03/31/15 1247  PROBNP 265.0*   CBG: No results for input(s): GLUCAP in the last 168 hours.  Radiological Exams on Admission: Dg Chest 2 View  06/23/2015   CLINICAL DATA:  Shortness of breath with weakness and lethargy  EXAM: CHEST  2 VIEW  COMPARISON:  Apr 25, 2015  FINDINGS: There is no edema or consolidation. The heart is upper normal in size with pulmonary vascularity within normal limits. No adenopathy. There is atherosclerotic change in aorta. There is evidence of old healed right 6, seventh, and eighth rib fractures. There is an exostosis arising from the anterior left first rib. There is degenerative change in thoracic spine.  There are surgical clips in the left upper abdomen.  IMPRESSION: No edema or consolidation.   Electronically Signed   By: Lowella Grip III M.D.   On: 06/23/2015 17:17    EKG: Independently reviewed.  Assessment/Plan Principal Problem:   Acute blood loss anemia Active Problems:   History of melena    Upper GI bleeding   Systolic CHF with reduced left ventricular function, NYHA class 3   Symptomatic anemia   Dieulafoy lesion of stomach   1. Acute blood loss anemia due to GIB - Rebleed of stomach dieulafoy lesion seems to be the most likely cause at this point. 1. Thankfully no hematemesis and bleed seems somewhat slower than last time (when she wound up intubated, etc) 2. Transfuse 1 unit PRBC initially due to symptomatic anemia (2nd unit on hold due to history of systolic CHF, already got 1 L IVF in ED) 1. H/H ordered post transfusion 2. CBC in AM 3. BMP in AM, monitor kidney function closely, creatinine is slightly bumped  from baseline 3. NPO after midnight 4. PPI gtt 5. Call GI in AM for possible EGD 2. Chronic systolic CHF -  1. Hold diuretics and PO potassium    Code Status: Full  Family Communication: Family at bedside Disposition Plan: Admit to inpatient   Time spent: 27 min  GARDNER, JARED M. [facility] Pager [phone]  If 7AM-7PM, please contact the day team taking care of the patient [email or web address] Password [email or web address] 06/23/2015, 11:19 PM

## 2015-06-23 NOTE — ED Notes (Signed)
Pt coming from [place]. C/o dizziness for several days and upper back pain  Into her shoulder blades. Report non-specific EKG changes. Pt c/o dizziness when she stands, denies CP. Is a x 4

## 2015-06-24 ENCOUNTER — Encounter (HOSPITAL_COMMUNITY): Payer: Self-pay | Admitting: *Deleted

## 2015-06-24 DIAGNOSIS — N179 Acute kidney failure, unspecified: Secondary | ICD-10-CM

## 2015-06-24 DIAGNOSIS — D62 Acute posthemorrhagic anemia: Secondary | ICD-10-CM

## 2015-06-24 DIAGNOSIS — D649 Anemia, unspecified: Secondary | ICD-10-CM

## 2015-06-24 DIAGNOSIS — Z8719 Personal history of other diseases of the digestive system: Secondary | ICD-10-CM

## 2015-06-24 DIAGNOSIS — N183 Chronic kidney disease, stage 3 (moderate): Secondary | ICD-10-CM

## 2015-06-24 DIAGNOSIS — I5020 Unspecified systolic (congestive) heart failure: Secondary | ICD-10-CM

## 2015-06-24 DIAGNOSIS — I10 Essential (primary) hypertension: Secondary | ICD-10-CM

## 2015-06-24 DIAGNOSIS — K922 Gastrointestinal hemorrhage, unspecified: Principal | ICD-10-CM

## 2015-06-24 LAB — CBC
HCT: 28.7 % — ABNORMAL LOW (ref 36.0–46.0)
Hemoglobin: 9.2 g/dL — ABNORMAL LOW (ref 12.0–15.0)
MCH: 30 pg (ref 26.0–34.0)
MCHC: 32.1 g/dL (ref 30.0–36.0)
MCV: 93.5 fL (ref 78.0–100.0)
Platelets: 110 10*3/uL — ABNORMAL LOW (ref 150–400)
RBC: 3.07 MIL/uL — AB (ref 3.87–5.11)
RDW: 18 % — ABNORMAL HIGH (ref 11.5–15.5)
WBC: 6 10*3/uL (ref 4.0–10.5)

## 2015-06-24 LAB — OCCULT BLOOD, POC DEVICE: Fecal Occult Bld: POSITIVE — AB

## 2015-06-24 LAB — BASIC METABOLIC PANEL
Anion gap: 6 (ref 5–15)
BUN: 32 mg/dL — ABNORMAL HIGH (ref 6–20)
CHLORIDE: 115 mmol/L — AB (ref 101–111)
CO2: 25 mmol/L (ref 22–32)
CREATININE: 1.26 mg/dL — AB (ref 0.44–1.00)
Calcium: 8.5 mg/dL — ABNORMAL LOW (ref 8.9–10.3)
GFR calc Af Amer: 44 mL/min — ABNORMAL LOW (ref >60)
GFR, EST NON AFRICAN AMERICAN: 38 mL/min — AB (ref >60)
Glucose, Bld: 96 mg/dL (ref 65–99)
Potassium: 3.7 mmol/L (ref 3.5–5.1)
Sodium: 146 mmol/L — ABNORMAL HIGH (ref 135–145)

## 2015-06-24 LAB — PROTIME-INR
INR: 1.13 (ref 0.00–1.49)
PROTHROMBIN TIME: 14.7 s (ref 11.6–15.2)

## 2015-06-24 LAB — CG4 I-STAT (LACTIC ACID): Lactic Acid, Venous: 1.11 mmol/L (ref 0.5–2.0)

## 2015-06-24 LAB — PREPARE RBC (CROSSMATCH)

## 2015-06-24 LAB — APTT: aPTT: 27 seconds (ref 24–37)

## 2015-06-24 MED ORDER — SODIUM CHLORIDE 0.9 % IV SOLN: INTRAVENOUS | Status: DC

## 2015-06-24 MED ORDER — ZOLPIDEM TARTRATE 5 MG PO TABS
5.0000 mg | ORAL_TABLET | Freq: Every evening | ORAL | Status: DC | PRN
Start: 2015-06-24 — End: 2015-06-28
  Administered 2015-06-24: 5 mg via ORAL
  Filled 2015-06-24: qty 1

## 2015-06-24 MED ORDER — SODIUM CHLORIDE 0.9 % IV SOLN
INTRAVENOUS | Status: AC
  Administered 2015-06-24: via INTRAVENOUS

## 2015-06-24 MED ORDER — SODIUM CHLORIDE 0.9 % IV SOLN
INTRAVENOUS | Status: DC
Start: 2015-06-24 — End: 2015-06-25
  Administered 2015-06-24: 20:00:00 via INTRAVENOUS

## 2015-06-24 NOTE — Progress Notes (Signed)
Admitted pt from ED AAox3. Oriented to room and call bell. VS taken tele on. Orders noted for blood transfusion. Consent obtained . IVF started. Continued to monitor

## 2015-06-24 NOTE — Consult Note (Signed)
Reason for Consult: Recurrent anemia guaiac positivity Referring Physician: Hospital team  Tiffany [name] is an 81 y.o. female.  HPI: Patient seen and examined and discussed with her daughter and her husband and her hospital computer chart was reviewed as well as her previous endoscopy and CAT scan and our office chart was reviewed and she has not had any GI symptoms and has not been on any aspirin or arthritis pills or blood thinners and only takes Tylenol and has been taking her stomach pill and has not noted any change in bowel habits but her stools are always black because of the iron and has been a long time since her last colonoscopy and she and her husband and daughter believes the last one was in [place] and she does not believe she is ever had polyps but one sister did have colon cancer but nothing else runs in the family from a GI standpoint and she has no other complaints  Past Medical History  Diagnosis Date  . Hypertension   . Arthritis   . Dementia   . Allergic rhinitis     Past Surgical History  Procedure Laterality Date  . Back surgery  09/25/2011  . Abdominal hysterectomy    . Esophagogastroduodenoscopy N/A 03/01/2015    Procedure: ESOPHAGOGASTRODUODENOSCOPY (EGD);  Surgeon: Arta Silence, MD;  Location: [facility];  Service: Endoscopy;  Laterality: N/A;  . Esophagogastroduodenoscopy N/A 03/02/2015    Procedure: ESOPHAGOGASTRODUODENOSCOPY (EGD);  Surgeon: Arta Silence, MD;  Location: [facility];  Service: Endoscopy;  Laterality: N/A;  bedside    Family History  Problem Relation Age of Onset  . Heart attack Brother   . Ovarian cancer Mother   . Colon cancer Sister   . Osteoarthritis Mother     Social History:  reports that she has never smoked. She has never used smokeless tobacco. She reports that she drinks alcohol. She reports that she does not use illicit drugs.  Allergies: No Known Allergies  Medications: I have reviewed the patient's current  medications.  Results for orders placed or performed during the hospital encounter of 06/23/15 (from the past 48 hour(s))  Basic metabolic panel     Status: Abnormal   Collection Time: 06/23/15  4:22 PM  Result Value Ref Range   Sodium 145 135 - 145 mmol/L   Potassium 4.8 3.5 - 5.1 mmol/L   Chloride 111 101 - 111 mmol/L   CO2 25 22 - 32 mmol/L   Glucose, Bld 126 (H) 65 - 99 mg/dL   BUN 48 (H) 6 - 20 mg/dL   Creatinine, Ser 1.67 (H) 0.44 - 1.00 mg/dL   Calcium 9.0 8.9 - 10.3 mg/dL   GFR calc non Af Amer 27 (L) >60 mL/min   GFR calc Af Amer 32 (L) >60 mL/min    Comment: (NOTE) The eGFR has been calculated using the CKD EPI equation. This calculation has not been validated in all clinical situations. eGFR's persistently <60 mL/min signify possible Chronic Kidney Disease.    Anion gap 9 5 - 15  CBC     Status: Abnormal   Collection Time: 06/23/15  4:22 PM  Result Value Ref Range   WBC 7.9 4.0 - 10.5 K/uL   RBC 2.57 (L) 3.87 - 5.11 MIL/uL   Hemoglobin 7.4 (L) 12.0 - 15.0 g/dL   HCT 24.4 (L) 36.0 - 46.0 %   MCV 94.9 78.0 - 100.0 fL   MCH 28.8 26.0 - 34.0 pg   MCHC 30.3 30.0 - 36.0  g/dL   RDW 17.8 (H) 11.5 - 15.5 %   Platelets 152 150 - 400 K/uL  I-stat troponin, ED     Status: None   Collection Time: 06/23/15  4:36 PM  Result Value Ref Range   Troponin i, poc 0.01 0.00 - 0.08 ng/mL   Comment 3            Comment: Due to the release kinetics of cTnI, a negative result within the first hours of the onset of symptoms does not rule out myocardial infarction with certainty. If myocardial infarction is still suspected, repeat the test at appropriate intervals.   Type and screen     Status: None (Preliminary result)   Collection Time: 06/23/15  8:22 PM  Result Value Ref Range   ABO/RH(D) B NEG    Antibody Screen NEG    Sample Expiration 06/26/2015    Unit Number [id number]    Blood Component Type RED CELLS,LR    Unit division 00    Status of Unit ISSUED    Transfusion  Status OK TO TRANSFUSE    Crossmatch Result Compatible    Unit Number [id number]    Blood Component Type RBC LR PHER1    Unit division 00    Status of Unit ALLOCATED    Transfusion Status OK TO TRANSFUSE    Crossmatch Result Compatible   CG4 I-STAT (Lactic acid)     Status: None   Collection Time: 06/23/15  8:34 PM  Result Value Ref Range   Lactic Acid, Venous 1.11 0.5 - 2.0 mmol/L  Protime-INR     Status: None   Collection Time: 06/23/15  9:20 PM  Result Value Ref Range   Prothrombin Time 14.7 11.6 - 15.2 seconds   INR 1.13 0.00 - 1.49  APTT     Status: None   Collection Time: 06/23/15  9:20 PM  Result Value Ref Range   aPTT 27 24 - 37 seconds  Occult blood, poc device     Status: Abnormal   Collection Time: 06/23/15  9:37 PM  Result Value Ref Range   Fecal Occult Bld POSITIVE (A) NEGATIVE  Prepare RBC (crossmatch)     Status: None   Collection Time: 06/24/15 12:13 AM  Result Value Ref Range   Order Confirmation ORDER PROCESSED BY BLOOD BANK   Basic metabolic panel     Status: Abnormal   Collection Time: 06/24/15  8:45 AM  Result Value Ref Range   Sodium 146 (H) 135 - 145 mmol/L   Potassium 3.7 3.5 - 5.1 mmol/L   Chloride 115 (H) 101 - 111 mmol/L   CO2 25 22 - 32 mmol/L   Glucose, Bld 96 65 - 99 mg/dL   BUN 32 (H) 6 - 20 mg/dL   Creatinine, Ser 1.26 (H) 0.44 - 1.00 mg/dL   Calcium 8.5 (L) 8.9 - 10.3 mg/dL   GFR calc non Af Amer 38 (L) >60 mL/min   GFR calc Af Amer 44 (L) >60 mL/min    Comment: (NOTE) The eGFR has been calculated using the CKD EPI equation. This calculation has not been validated in all clinical situations. eGFR's persistently <60 mL/min signify possible Chronic Kidney Disease.    Anion gap 6 5 - 15  CBC     Status: Abnormal   Collection Time: 06/24/15  8:45 AM  Result Value Ref Range   WBC 6.0 4.0 - 10.5 K/uL   RBC 3.07 (L) 3.87 - 5.11 MIL/uL   Hemoglobin 9.2 (L) 12.0 -  15.0 g/dL   HCT 28.7 (L) 36.0 - 46.0 %   MCV 93.5 78.0 - 100.0 fL    MCH 30.0 26.0 - 34.0 pg   MCHC 32.1 30.0 - 36.0 g/dL   RDW 18.0 (H) 11.5 - 15.5 %   Platelets 110 (L) 150 - 400 K/uL    Comment: PLATELET COUNT CONFIRMED BY SMEAR    Dg Chest 2 View  06/23/2015   CLINICAL DATA:  Shortness of breath with weakness and lethargy  EXAM: CHEST  2 VIEW  COMPARISON:  Apr 25, 2015  FINDINGS: There is no edema or consolidation. The heart is upper normal in size with pulmonary vascularity within normal limits. No adenopathy. There is atherosclerotic change in aorta. There is evidence of old healed right 6, seventh, and eighth rib fractures. There is an exostosis arising from the anterior left first rib. There is degenerative change in thoracic spine.  There are surgical clips in the left upper abdomen.  IMPRESSION: No edema or consolidation.   Electronically Signed   By: Lowella Grip III M.D.   On: 06/23/2015 17:17    ROS negative except above Blood pressure 141/56, pulse 70, temperature 98.6 F (37 C), temperature source Oral, resp. rate 18, height _0  (1.575 m), weight 61.326 kg (135 lb 3.2 oz), SpO2 93 %. Physical Exam vital signs stable afebrile no acute distress lungs clear heart regular rate and rhythm abdomen is soft nontender labs reviewed CT reviewed   Assessment/Plan: Subacute GI blood loss in an elderly patient Plan: We rediscussed endoscopy and will proceed with that tomorrow and consider colonoscopy Sunday or Monday depending on those findings and the patient and her family agree with the plan   [facility] E 06/24/2015, 5:43 PM

## 2015-06-24 NOTE — Progress Notes (Signed)
[facility] PROGRESS NOTE  Tiffany [name] HYI:[id number] DOB: 01/04/1929 DOA: 06/23/2015 PCP: Kennyth Arnold, FNP  Assessment/Plan: 1-symptomatic anemia: appears to be secondary to GIB, presumed upper GI -continue NPO -continue PPI -follow Hgb trend -patient to be seen by gastroenterology for potential EGD -hemodynamically stable   2-HTN: BP stable -continue metoprolol  3-dementia:  -will continue namenda   4-chronic systolic heart failure: -stable and compensated -will monitor and be judicious with fluid and blood resuscitation -daily weight -continue low sodium diet -will hold lasix as patient Cr is up -continue metoprolol  5-depression: continue Remeron  6-acute on chronic renal failure: -patient with stage 3 at baseline  -worse with decrease perfusion with anemia and continue use of lasix -will hold lasix -gentle hydration and blood transfusion -will follow renal function     Code Status: Full Family Communication: husband at bedside  Disposition Plan: home when medically stable; Hgb stable and no orthostatic/lightheadedness sx's   Consultants:  [facility]   Procedures:  None   Antibiotics:  None   HPI/Subjective: Afebrile, denies CP, SOB, nausea and vomiting. Patient w/o major overt bleeding episode. Hemodynamically stable.  Objective: Filed Vitals:   06/24/15 0821  BP: 152/53  Pulse: 70  Temp: 98.2 F (36.8 C)  Resp: 18    Intake/Output Summary (Last 24 hours) at 06/24/15 1322 Last data filed at 06/24/15 1315  Gross per 24 hour  Intake    365 ml  Output    301 ml  Net     64 ml   Filed Weights   06/23/15 2321 06/24/15 0634  Weight: 61.598 kg (135 lb 12.8 oz) 61.326 kg (135 lb 3.2 oz)    Exam:   General:  Afebrile, no CP, feeling better and denies lightheadedness sensation  Cardiovascular: S1 and s2, no rubs or gallops  Respiratory: no wheezing, no crackles  Abdomen: soft, NT, ND, positive BS  Musculoskeletal: no  edema, cyanosis or clubbing   Data Reviewed: Basic Metabolic Panel:  Recent Labs Lab 06/23/15 1622 06/24/15 0845  NA 145 146*  K 4.8 3.7  CL 111 115*  CO2 25 25  GLUCOSE 126* 96  BUN 48* 32*  CREATININE 1.67* 1.26*  CALCIUM 9.0 8.5*   CBC:  Recent Labs Lab 06/23/15 1622 06/24/15 0845  WBC 7.9 6.0  HGB 7.4* 9.2*  HCT 24.4* 28.7*  MCV 94.9 93.5  PLT 152 110*   BNP (last 3 results)  Recent Labs  03/01/15 1800 03/08/15 0407  BNP 55.6 567.5*    ProBNP (last 3 results)  Recent Labs  03/31/15 1247  PROBNP 265.0*    Studies: Dg Chest 2 View  06/23/2015   CLINICAL DATA:  Shortness of breath with weakness and lethargy  EXAM: CHEST  2 VIEW  COMPARISON:  Apr 25, 2015  FINDINGS: There is no edema or consolidation. The heart is upper normal in size with pulmonary vascularity within normal limits. No adenopathy. There is atherosclerotic change in aorta. There is evidence of old healed right 6, seventh, and eighth rib fractures. There is an exostosis arising from the anterior left first rib. There is degenerative change in thoracic spine.  There are surgical clips in the left upper abdomen.  IMPRESSION: No edema or consolidation.   Electronically Signed   By: Lowella Grip III M.D.   On: 06/23/2015 17:17    Scheduled Meds: . sodium chloride   Intravenous Once  . memantine  28 mg Oral Daily  . metoprolol succinate  12.5 mg  Oral Daily  . mirtazapine  15 mg Oral QHS  . [START ON 06/27/2015] pantoprazole (PROTONIX) IV  40 mg Intravenous Q12H  . sodium chloride  3 mL Intravenous Q12H   Continuous Infusions: . pantoprozole (PROTONIX) infusion 8 mg/hr (06/24/15 0100)    Principal Problem:   Acute blood loss anemia Active Problems:   History of melena   Upper GI bleeding   Systolic CHF with reduced left ventricular function, NYHA class 3   Symptomatic anemia   Dieulafoy lesion of stomach    Time spent: 30 minutes    Barton Dubois  [facility] Pager  [phone]. If 7PM-7AM, please contact night-coverage at [email or web address], password [facility] 06/24/2015, 1:22 PM  LOS: 1 day

## 2015-06-24 NOTE — Progress Notes (Signed)
Utilization review completed. Nathyn Luiz, RN, BSN. 

## 2015-06-24 NOTE — Progress Notes (Signed)
Nutrition Brief Note  Patient identified on the Malnutrition Screening Tool (MST) Report  Wt Readings from Last 15 Encounters:  06/24/15 135 lb 3.2 oz (61.326 kg)  06/01/15 131 lb (59.421 kg)  05/12/15 129 lb 4.8 oz (58.65 kg)  04/25/15 126 lb 3.2 oz (57.244 kg)  04/20/15 127 lb 4.8 oz (57.743 kg)  03/31/15 137 lb (62.143 kg)  03/17/15 122 lb 12.8 oz (55.702 kg)  03/12/15 122 lb 6.4 oz (55.52 kg)  11/26/14 131 lb 14.4 oz (59.829 kg)  09/21/14 132 lb (59.875 kg)  07/23/14 132 lb (59.875 kg)  06/30/14 133 lb (60.328 kg)  05/12/14 135 lb (61.236 kg)  03/30/14 133 lb (60.328 kg)  02/23/14 134 lb 6.4 oz (60.963 kg)    Body mass index is 24.72 kg/(m^2). Patient meets criteria for Normal Weight based on current BMI.   Current diet order is NPO. Pt denies any weight loss and reports that she was eating very well PTA. She states that her appetite is good and she is wanting to eat. She denies any nutrition questions or concerns at this time. Labs and medications reviewed.   No nutrition interventions warranted at this time. If nutrition issues arise, please consult RD.   Pryor Ochoa RD, LDN Inpatient Clinical Dietitian Pager: [phone] After Hours Pager: [phone]

## 2015-06-25 ENCOUNTER — Inpatient Hospital Stay (HOSPITAL_COMMUNITY): Payer: Commercial Managed Care - HMO | Admitting: Anesthesiology

## 2015-06-25 ENCOUNTER — Encounter (HOSPITAL_COMMUNITY): Payer: Self-pay

## 2015-06-25 ENCOUNTER — Encounter (HOSPITAL_COMMUNITY)
Admission: EM | Disposition: A | Discharge status: Home Health | Payer: Self-pay | Source: Home / Self Care | Attending: Internal Medicine

## 2015-06-25 DIAGNOSIS — G309 Alzheimer's disease, unspecified: Secondary | ICD-10-CM

## 2015-06-25 HISTORY — PX: ESOPHAGOGASTRODUODENOSCOPY (EGD) WITH PROPOFOL: SHX5813

## 2015-06-25 LAB — CBC
HCT: 29.4 % — ABNORMAL LOW (ref 36.0–46.0)
Hemoglobin: 9.1 g/dL — ABNORMAL LOW (ref 12.0–15.0)
MCH: 28.7 pg (ref 26.0–34.0)
MCHC: 31 g/dL (ref 30.0–36.0)
MCV: 92.7 fL (ref 78.0–100.0)
PLATELETS: 115 10*3/uL — AB (ref 150–400)
RBC: 3.17 MIL/uL — ABNORMAL LOW (ref 3.87–5.11)
RDW: 18 % — AB (ref 11.5–15.5)
WBC: 6.7 10*3/uL (ref 4.0–10.5)

## 2015-06-25 LAB — BASIC METABOLIC PANEL
ANION GAP: 8 (ref 5–15)
BUN: 19 mg/dL (ref 6–20)
CHLORIDE: 111 mmol/L (ref 101–111)
CO2: 23 mmol/L (ref 22–32)
Calcium: 8.4 mg/dL — ABNORMAL LOW (ref 8.9–10.3)
Creatinine, Ser: 1.21 mg/dL — ABNORMAL HIGH (ref 0.44–1.00)
GFR calc non Af Amer: 40 mL/min — ABNORMAL LOW (ref >60)
GFR, EST AFRICAN AMERICAN: 47 mL/min — AB (ref >60)
Glucose, Bld: 124 mg/dL — ABNORMAL HIGH (ref 65–99)
Potassium: 4 mmol/L (ref 3.5–5.1)
SODIUM: 142 mmol/L (ref 135–145)

## 2015-06-25 SURGERY — ESOPHAGOGASTRODUODENOSCOPY (EGD) WITH PROPOFOL
Anesthesia: Monitor Anesthesia Care

## 2015-06-25 MED ORDER — PANTOPRAZOLE SODIUM 40 MG PO TBEC
40.0000 mg | DELAYED_RELEASE_TABLET | Freq: Every day | ORAL | Status: DC
  Administered 2015-06-25 – 2015-06-28 (×4): 40 mg via ORAL
  Filled 2015-06-25 (×4): qty 1

## 2015-06-25 MED ORDER — LIDOCAINE HCL (CARDIAC) 20 MG/ML IV SOLN
INTRAVENOUS | Status: DC | PRN
  Administered 2015-06-25: 40 mg via INTRAVENOUS

## 2015-06-25 MED ORDER — MAGNESIUM CITRATE PO SOLN: 300.0000 mL | Freq: Once | ORAL | Status: DC

## 2015-06-25 MED ORDER — ONDANSETRON HCL 4 MG/2ML IJ SOLN: 4.0000 mg | Freq: Once | INTRAMUSCULAR | Status: DC | PRN

## 2015-06-25 MED ORDER — MAGNESIUM CITRATE PO SOLN
300.0000 mL | Freq: Once | ORAL | Status: AC
  Administered 2015-06-25: 300 mL via ORAL
  Filled 2015-06-25: qty 592

## 2015-06-25 MED ORDER — PROPOFOL 10 MG/ML IV BOLUS
INTRAVENOUS | Status: DC | PRN
  Administered 2015-06-25 (×2): 10 mg via INTRAVENOUS

## 2015-06-25 MED ORDER — PROPOFOL INFUSION 10 MG/ML OPTIME
INTRAVENOUS | Status: DC | PRN
  Administered 2015-06-25: 75 ug/kg/min via INTRAVENOUS

## 2015-06-25 NOTE — Progress Notes (Signed)
After discussion with multiple family members and they would like the patient to proceed with a colonoscopy on Monday which I have set up for Dr. Paulita Fujita at 9:30

## 2015-06-25 NOTE — Progress Notes (Signed)
0730 anxious and verbalized anxiety for incoming EGD today .fall prone trying to get oob  . Safety sitter with pt. Diversional activities provided

## 2015-06-25 NOTE — Op Note (Signed)
[facility] [place], [id number]   ENDOSCOPY PROCEDURE REPORT  PATIENT: Tiffany [name], Tiffany [name]  MR#: [id number] BIRTHDATE: August 27, 1929 , 85  yrs. old GENDER: female ENDOSCOPIST: Clarene Essex, MD REFERRED BY: PROCEDURE DATE:  07-06-2015 PROCEDURE:  EGD, diagnostic ASA CLASS:     Class III INDICATIONS:  hemocult positive stool and acute post hemorrhagic anemia. MEDICATIONS: Propofol 50 mg IV and Lidocaine 40 mg IV TOPICAL ANESTHETIC: Cetacaine Spray  DESCRIPTION OF PROCEDURE: After the risks benefits and alternatives of the procedure were thoroughly explained, informed consent was obtained.  The endoscope [id number] endoscope was introduced through the mouth and advanced to the second portion of the duodenum , Without limitations.  The instrument was slowly withdrawn as the mucosa was fully examined. Estimated blood loss is zero unless otherwise noted in this procedure report.    The findings are recorded below       Retroflexed views revealed a hiatal hernia.     The scope was then withdrawn from the patient and the procedure completed.  COMPLICATIONS: There were no immediate complications.  ENDOSCOPIC IMPRESSION: 1.small hiatal hernia   2. 2 clips seen in proximal stomach without underlying lesion 3. Otherwise within normal limits to the third portion of the duodenum without any blood being seen  RECOMMENDATIONS: we will rediscuss colonoscopy with the family and would proceed on Monday with anesthesia assistance if they would like otherwise we can slowly advance her diet but will keep on clear liquids of colonoscopy requested  REPEAT EXAM: as needed  eSigned:  Clarene Essex, MD 07/06/2015 11:49 AM    CC:  CPT CODES: ICD CODES:  The ICD and CPT codes recommended by this software are interpretations from the data that the clinical staff has captured with the software.  The verification of the translation of this report to the ICD  and CPT codes and modifiers is the sole responsibility of the health care institution and practicing physician where this report was generated.  [employer] will not be held responsible for the validity of the ICD and CPT codes included on this report.  AMA assumes no liability for data contained or not contained herein. CPT is a Designer, television/film set of the [employer].  PATIENT NAME:  Makiyla, Linch MR#: [id number]

## 2015-06-25 NOTE — Anesthesia Postprocedure Evaluation (Signed)
  Anesthesia Post-op Note  Patient: Tiffany [name]  Procedure(s) Performed: Procedure(s): ESOPHAGOGASTRODUODENOSCOPY (EGD) WITH PROPOFOL (N/A)  Patient Location: PACU  Anesthesia Type:MAC  Level of Consciousness: awake, oriented, sedated and patient cooperative  Airway and Oxygen Therapy: Patient Spontanous Breathing  Post-op Pain: none  Post-op Assessment: Post-op Vital signs reviewed, Patient's Cardiovascular Status Stable, Respiratory Function Stable, Patent Airway, No signs of Nausea or vomiting and Pain level controlled              Post-op Vital Signs: stable  Last Vitals:  Filed Vitals:   06/25/15 1200  BP: 151/72  Pulse: 73  Temp:   Resp: 19    Complications: No apparent anesthesia complications

## 2015-06-25 NOTE — Transfer of Care (Signed)
Immediate Anesthesia Transfer of Care Note  Patient: Tiffany [name]  Procedure(s) Performed: Procedure(s): ESOPHAGOGASTRODUODENOSCOPY (EGD) WITH PROPOFOL (N/A)  Patient Location: Endoscopy Unit  Anesthesia Type:MAC  Level of Consciousness: sedated  Airway & Oxygen Therapy: Patient Spontanous Breathing and Patient connected to nasal cannula oxygen  Post-op Assessment: Report given to RN and Post -op Vital signs reviewed and stable  Post vital signs: Reviewed and stable  Last Vitals:  Filed Vitals:   06/25/15 1046  BP: 162/61  Pulse: 79  Temp: 36.7 C  Resp: 22    Complications: No apparent anesthesia complications

## 2015-06-25 NOTE — Progress Notes (Signed)
At bedtime patient requested something to help her sleep. Patient stated she has not had any sleep and would like to get a good rest prior to her procedure. Notified Dr. Rogue Bussing and orders given for Ambien 5mg  and was administered as ordered.  Later in the night around 0030, noticed patient was mildly disoriented but still was able to state name, DOB, and location of where she is. Patient stated she needed to go to the grocery store before her procedure. Reoriented patient and patient begin to remember she was in the hospital as well as tell the nurse why she is having the procedure this morning. As the night went on into the morning, patient begin to get out of bed though she is very unsteady on her feet. Patient is to get up with one assist and wanted to get up independently and did not have good judgement of her balance. Bed alarm was set each time. Explained to patient that she is only to get up with assistant and though patient stated she understood, she continuously on several occassions tried to get out of bed which was very unsafe. Patient on several occassions took off telemetry monitor, leads, and gown and had two occurrences of incontinence in which nurse and nurse tech assisted patient to bedside commode at two different times.   Notified on-call hospitalist of patient being mildly disoriented but has progressed to being very confused to the point where she thinks she's at home and has to run errands and nurse is not able to watch patient efficiently for having to pass medication. Thankfully another nurse who was done passing medication was able to stay with patient until a sitter was available. Orders given for sitter and will continue to monitor to end of shift.

## 2015-06-25 NOTE — Progress Notes (Signed)
Also during the night patient had pulled out peripheral IV. Received new peripheral IV by IV team into the right lateral part of arm. Patient tolerated without any discomfort.

## 2015-06-25 NOTE — Progress Notes (Signed)
Tiffany [name] 11:31 AM  Subjective: Patient without any signs of bleeding but did have a bad night due to mental status issues but no new GI complaints  Objective: Vital signs stable afebrile no acute distress exam please see preassessment evaluation hemoglobin stable  Assessment: Guaiac positive anemia  Plan: Okay to proceed with endoscopy this morning with anesthesia assistance  [facility] E  Pager [phone] After 5PM or if no answer call [phone]

## 2015-06-25 NOTE — Anesthesia Preprocedure Evaluation (Addendum)
Anesthesia Evaluation  Patient identified by MRN, date of birth, ID band Patient confused    Reviewed: Allergy & Precautions, NPO status , Patient's Chart, lab work & pertinent test results  Airway Mallampati: I  TM Distance: >3 FB Neck ROM: Full    Dental  (+) Edentulous Upper, Dental Advisory Given   Pulmonary    Pulmonary exam normal       Cardiovascular hypertension, Pt. on home beta blockers and Pt. on medications + Peripheral Vascular Disease and +CHF Normal cardiovascular exam    Neuro/Psych alzheimers    GI/Hepatic   Endo/Other    Renal/GU      Musculoskeletal  (+) Arthritis -,   Abdominal   Peds  Hematology  (+) anemia ,   Anesthesia Other Findings Dementia  Reproductive/Obstetrics                            Anesthesia Physical Anesthesia Plan  ASA: III  Anesthesia Plan: MAC   Post-op Pain Management:    Induction: Intravenous  Airway Management Planned: Mask  Additional Equipment:   Intra-op Plan:   Post-operative Plan:   Informed Consent: I have reviewed the patients History and Physical, chart, labs and discussed the procedure including the risks, benefits and alternatives for the proposed anesthesia with the patient or authorized representative who has indicated his/her understanding and acceptance.     Plan Discussed with: CRNA, Anesthesiologist and Surgeon  Anesthesia Plan Comments:         Anesthesia Quick Evaluation

## 2015-06-25 NOTE — Progress Notes (Signed)
[facility] PROGRESS NOTE  Tiffany [name] PJK:[id number] DOB: 1929-11-10 DOA: 06/23/2015 PCP: Kennyth Arnold, FNP  Assessment/Plan: 1-symptomatic anemia: appears to be secondary to GIB -continue CLD -no source of bleeding on EGD; per GI will perform colonoscopy on Monday -continue PPI -follow Hgb trend -hemodynamically stable  -no overt bleeding appreciated  2-HTN: BP stable -continue metoprolol -will monitor VS  3-dementia:  -will continue namenda  -continue supportive care  4-chronic systolic heart failure: -stable and compensated currently  -will monitor and be judicious with fluid and blood resuscitation -follow daily weight -continue low sodium diet -will continue holding lasix as patient Cr still up -continue metoprolol  5-depression: continue Remeron  6-acute on chronic renal failure: -patient with stage 3 at baseline  -worse with decrease perfusion with anemia and continue use of lasix -will continue holding diuretics for another 24 hours -gentle hydration and blood transfusion provided -Cr down to 1.21 -will follow renal function    Code Status: Full Family Communication: husband at bedside  Disposition Plan: home when medically stable; Hgb stable and no orthostatic/lightheadedness sx's   Consultants:  [facility]   Procedures:  EGD 7/16: no acute source of bleeding identified   Antibiotics:  None   HPI/Subjective: Afebrile, denies CP, SOB, nausea and vomiting. Patient without lightheadedness   Objective: Filed Vitals:   06/25/15 1200  BP: 151/72  Pulse: 73  Temp:   Resp: 19    Intake/Output Summary (Last 24 hours) at 06/25/15 1536 Last data filed at 06/25/15 1457  Gross per 24 hour  Intake    650 ml  Output   1151 ml  Net   -501 ml   Filed Weights   06/23/15 2321 06/24/15 0634 06/25/15 0533  Weight: 61.598 kg (135 lb 12.8 oz) 61.326 kg (135 lb 3.2 oz) 62.234 kg (137 lb 3.2 oz)    Exam:   General:  Afebrile, no CP,  feeling somewhat better and denying any lightheadedness or SOB  Cardiovascular: S1 and s2, no rubs or gallops  Respiratory: no wheezing, no crackles  Abdomen: soft, NT, ND, positive BS  Musculoskeletal: no edema, cyanosis or clubbing   Data Reviewed: Basic Metabolic Panel:  Recent Labs Lab 06/23/15 1622 06/24/15 0845 06/25/15 0336  NA 145 146* 142  K 4.8 3.7 4.0  CL 111 115* 111  CO2 25 25 23   GLUCOSE 126* 96 124*  BUN 48* 32* 19  CREATININE 1.67* 1.26* 1.21*  CALCIUM 9.0 8.5* 8.4*   CBC:  Recent Labs Lab 06/23/15 1622 06/24/15 0845 06/25/15 0336  WBC 7.9 6.0 6.7  HGB 7.4* 9.2* 9.1*  HCT 24.4* 28.7* 29.4*  MCV 94.9 93.5 92.7  PLT 152 110* 115*   BNP (last 3 results)  Recent Labs  03/01/15 1800 03/08/15 0407  BNP 55.6 567.5*    ProBNP (last 3 results)  Recent Labs  03/31/15 1247  PROBNP 265.0*    Studies: Dg Chest 2 View  06/23/2015   CLINICAL DATA:  Shortness of breath with weakness and lethargy  EXAM: CHEST  2 VIEW  COMPARISON:  Apr 25, 2015  FINDINGS: There is no edema or consolidation. The heart is upper normal in size with pulmonary vascularity within normal limits. No adenopathy. There is atherosclerotic change in aorta. There is evidence of old healed right 6, seventh, and eighth rib fractures. There is an exostosis arising from the anterior left first rib. There is degenerative change in thoracic spine.  There are surgical clips in the left upper abdomen.  IMPRESSION: No edema or consolidation.   Electronically Signed   By: Lowella Grip III M.D.   On: 06/23/2015 17:17    Scheduled Meds: . magnesium citrate  300 mL Oral Once  . memantine  28 mg Oral Daily  . metoprolol succinate  12.5 mg Oral Daily  . mirtazapine  15 mg Oral QHS  . pantoprazole  40 mg Oral Q1200  . sodium chloride  3 mL Intravenous Q12H   Continuous Infusions:    Principal Problem:   Acute blood loss anemia Active Problems:   History of melena   Upper GI  bleeding   Systolic CHF with reduced left ventricular function, NYHA class 3   Symptomatic anemia   Dieulafoy lesion of stomach    Time spent: 30 minutes    Barton Dubois  [facility] Pager [phone]. If 7PM-7AM, please contact night-coverage at [email or web address], password [email or web address] 06/25/2015, 3:36 PM  LOS: 2 days

## 2015-06-26 LAB — CBC
HCT: 33.3 % — ABNORMAL LOW (ref 36.0–46.0)
HEMOGLOBIN: 10.6 g/dL — AB (ref 12.0–15.0)
MCH: 29.8 pg (ref 26.0–34.0)
MCHC: 31.8 g/dL (ref 30.0–36.0)
MCV: 93.5 fL (ref 78.0–100.0)
PLATELETS: 128 10*3/uL — AB (ref 150–400)
RBC: 3.56 MIL/uL — ABNORMAL LOW (ref 3.87–5.11)
RDW: 17.5 % — ABNORMAL HIGH (ref 11.5–15.5)
WBC: 7.7 10*3/uL (ref 4.0–10.5)

## 2015-06-26 MED ORDER — MENTHOL 3 MG MT LOZG
1.0000 | LOZENGE | OROMUCOSAL | Status: DC | PRN
  Administered 2015-06-26: 3 mg via ORAL
  Filled 2015-06-26: qty 9

## 2015-06-26 MED ORDER — POLYETHYLENE GLYCOL 3350 17 GM/SCOOP PO POWD
255.0000 g | Freq: Once | ORAL | Status: AC
  Administered 2015-06-26: 255 g via ORAL
  Filled 2015-06-26: qty 255

## 2015-06-26 MED ORDER — POLYETHYLENE GLYCOL 3350 17 GM/SCOOP PO POWD: 255.0000 g | Freq: Once | ORAL | Status: DC

## 2015-06-26 NOTE — Progress Notes (Signed)
[facility] PROGRESS NOTE  Tiffany [name] HUT:[id number] DOB: 1929/08/31 DOA: 06/23/2015 PCP: Kennyth Arnold, FNP  Assessment/Plan: 1-symptomatic anemia: appears to be secondary to GIB -continue CLD -no source of bleeding on EGD; per GI will perform colonoscopy on Monday -continue PPI -follow Hgb trend -hemodynamically stable  -no overt bleeding appreciated  2-HTN: BP stable -continue metoprolol -will monitor VS  3-dementia:  -will continue namenda  -continue supportive care  4-chronic systolic heart failure: -stable and compensated currently  -will monitor and be judicious with fluid and blood resuscitation -follow daily weight -continue low sodium diet -will continue holding lasix as patient Cr still up and patient prepping for colonoscopy (increase chances for dehydration due to intravascular depletion) -continue metoprolol  5-depression: continue Remeron  6-acute on chronic renal failure: -patient with stage 3 at baseline  -worse with decrease perfusion with anemia and continue use of lasix -will continue holding diuretics for another 24 hours; patient now prepping for colonoscopy in am -gentle hydration and blood transfusion provided; will follow renal function    Code Status: Full Family Communication: husband at bedside  Disposition Plan: home when medically stable; Hgb stable and no orthostatic/lightheadedness sx's   Consultants:  [facility]   Procedures:  EGD 7/16: no acute source of bleeding identified   Colonoscopy 7/18:   Antibiotics:  None   HPI/Subjective: Afebrile, denies CP, SOB, nausea and vomiting. Patient complaining of sore throat from EGD. In prep for colonoscopy in am (7/18)  Objective: Filed Vitals:   06/26/15 1330  BP: 149/74  Pulse: 80  Temp: 98 F (36.7 C)  Resp: 18    Intake/Output Summary (Last 24 hours) at 06/26/15 1520 Last data filed at 06/26/15 1400  Gross per 24 hour  Intake    980 ml  Output    262 ml   Net    718 ml   Filed Weights   06/24/15 0634 06/25/15 0533 06/26/15 0452  Weight: 61.326 kg (135 lb 3.2 oz) 62.234 kg (137 lb 3.2 oz) 61.326 kg (135 lb 3.2 oz)    Exam:   General:  Afebrile, no CP, feeling better and denying any lightheadedness. Reports some sore throat from EGD  Cardiovascular: S1 and s2, no rubs or gallops  Respiratory: no wheezing, no crackles  Abdomen: soft, NT, ND, positive BS  Musculoskeletal: no edema, cyanosis or clubbing   Data Reviewed: Basic Metabolic Panel:  Recent Labs Lab 06/23/15 1622 06/24/15 0845 06/25/15 0336  NA 145 146* 142  K 4.8 3.7 4.0  CL 111 115* 111  CO2 25 25 23   GLUCOSE 126* 96 124*  BUN 48* 32* 19  CREATININE 1.67* 1.26* 1.21*  CALCIUM 9.0 8.5* 8.4*   CBC:  Recent Labs Lab 06/23/15 1622 06/24/15 0845 06/25/15 0336 06/26/15 0331  WBC 7.9 6.0 6.7 7.7  HGB 7.4* 9.2* 9.1* 10.6*  HCT 24.4* 28.7* 29.4* 33.3*  MCV 94.9 93.5 92.7 93.5  PLT 152 110* 115* 128*   BNP (last 3 results)  Recent Labs  03/01/15 1800 03/08/15 0407  BNP 55.6 567.5*    ProBNP (last 3 results)  Recent Labs  03/31/15 1247  PROBNP 265.0*    Studies: No results found.  Scheduled Meds: . memantine  28 mg Oral Daily  . metoprolol succinate  12.5 mg Oral Daily  . mirtazapine  15 mg Oral QHS  . pantoprazole  40 mg Oral Q1200  . sodium chloride  3 mL Intravenous Q12H   Continuous Infusions:    Principal Problem:  Acute blood loss anemia Active Problems:   History of melena   Upper GI bleeding   Systolic CHF with reduced left ventricular function, NYHA class 3   Symptomatic anemia   Dieulafoy lesion of stomach    Time spent: 30 minutes    Barton Dubois  [facility] Pager [phone]. If 7PM-7AM, please contact night-coverage at [email or web address], password [facility] 06/26/2015, 3:20 PM  LOS: 3 days

## 2015-06-26 NOTE — Progress Notes (Signed)
Tiffany [name] 10:26 AM  Subjective: Patient doing well and we discussed her endoscopy yesterday and her colonoscopy coming up tomorrow and discussed her prep today as well she's had some results from her magnesium citrate but she did not look at her stools to see the color  Objective: Vital signs stable afebrile no acute distress abdomen is soft nontender hemoglobin stable  Assessment: Guaiac positive anemia questionable etiology  Plan: Colonoscopy tomorrow by Dr. Paulita Fujita at 9:30 and if no significant findings probably okay to advance diet and go home soon  [facility] E  Pager [phone] After 5PM or if no answer call [phone]

## 2015-06-26 NOTE — Progress Notes (Signed)
Pt a/o, no c/o pain, pt started bowl prep for colonoscopy 7/18, VSS, pt stable

## 2015-06-27 ENCOUNTER — Encounter (HOSPITAL_COMMUNITY)
Admission: EM | Disposition: A | Discharge status: Home Health | Payer: Self-pay | Source: Home / Self Care | Attending: Internal Medicine

## 2015-06-27 ENCOUNTER — Inpatient Hospital Stay (HOSPITAL_COMMUNITY): Payer: Commercial Managed Care - HMO | Admitting: Anesthesiology

## 2015-06-27 ENCOUNTER — Encounter (HOSPITAL_COMMUNITY): Payer: Self-pay | Admitting: Anesthesiology

## 2015-06-27 HISTORY — PX: GIVENS CAPSULE STUDY: SHX5432

## 2015-06-27 HISTORY — PX: COLONOSCOPY WITH PROPOFOL: SHX5780

## 2015-06-27 LAB — TYPE AND SCREEN
ABO/RH(D): B NEG
Antibody Screen: NEGATIVE
UNIT DIVISION: 0
Unit division: 0

## 2015-06-27 LAB — BASIC METABOLIC PANEL
Anion gap: 10 (ref 5–15)
BUN: 14 mg/dL (ref 6–20)
CO2: 22 mmol/L (ref 22–32)
Calcium: 8.5 mg/dL — ABNORMAL LOW (ref 8.9–10.3)
Chloride: 110 mmol/L (ref 101–111)
Creatinine, Ser: 1.09 mg/dL — ABNORMAL HIGH (ref 0.44–1.00)
GFR calc Af Amer: 53 mL/min — ABNORMAL LOW (ref >60)
GFR calc non Af Amer: 46 mL/min — ABNORMAL LOW (ref >60)
GLUCOSE: 93 mg/dL (ref 65–99)
POTASSIUM: 4 mmol/L (ref 3.5–5.1)
Sodium: 142 mmol/L (ref 135–145)

## 2015-06-27 LAB — CBC
HCT: 31.4 % — ABNORMAL LOW (ref 36.0–46.0)
Hemoglobin: 10.2 g/dL — ABNORMAL LOW (ref 12.0–15.0)
MCH: 29.7 pg (ref 26.0–34.0)
MCHC: 32.5 g/dL (ref 30.0–36.0)
MCV: 91.3 fL (ref 78.0–100.0)
Platelets: 87 10*3/uL — ABNORMAL LOW (ref 150–400)
RBC: 3.44 MIL/uL — AB (ref 3.87–5.11)
RDW: 17 % — ABNORMAL HIGH (ref 11.5–15.5)
WBC: 8 10*3/uL (ref 4.0–10.5)

## 2015-06-27 SURGERY — COLONOSCOPY WITH PROPOFOL
Anesthesia: Monitor Anesthesia Care

## 2015-06-27 SURGERY — IMAGING PROCEDURE, GI TRACT, INTRALUMINAL, VIA CAPSULE
Anesthesia: LOCAL

## 2015-06-27 MED ORDER — PROPOFOL INFUSION 10 MG/ML OPTIME
INTRAVENOUS | Status: DC | PRN
  Administered 2015-06-27: 50 ug/kg/min via INTRAVENOUS

## 2015-06-27 MED ORDER — FUROSEMIDE 20 MG PO TABS
20.0000 mg | ORAL_TABLET | Freq: Every day | ORAL | Status: DC
  Administered 2015-06-27 – 2015-06-28 (×2): 20 mg via ORAL
  Filled 2015-06-27 (×2): qty 1

## 2015-06-27 MED ORDER — SODIUM CHLORIDE 0.9 % IV SOLN
INTRAVENOUS | Status: DC
  Administered 2015-06-27: 08:00:00 via INTRAVENOUS

## 2015-06-27 MED ORDER — LACTATED RINGERS IV SOLN
INTRAVENOUS | Status: DC | PRN
  Administered 2015-06-27: 10:00:00 via INTRAVENOUS

## 2015-06-27 SURGICAL SUPPLY — 1 items: TOWEL COTTON PACK 4EA (MISCELLANEOUS) ×4 IMPLANT

## 2015-06-27 NOTE — Care Management Important Message (Signed)
Important Message  Patient Details  Name: Tiffany [name] MRN: [id number] Date of Birth: [birth year]   Medicare Important Message Given:  Yes-second notification given    Pricilla Handler 06/27/2015, 2:52 PM

## 2015-06-27 NOTE — Interval H&P Note (Signed)
History and Physical Interval Note:  06/27/2015 9:36 AM  Tiffany [name]  has presented today for surgery, with the diagnosis of anemia  The various methods of treatment have been discussed with the patient and family. After consideration of risks, benefits and other options for treatment, the patient has consented to  Procedure(s): COLONOSCOPY WITH PROPOFOL (N/A) as a surgical intervention .  The patient's history has been reviewed, patient examined, no change in status, stable for surgery.  I have reviewed the patient's chart and labs.  Questions were answered to the patient's satisfaction.     Landry Dyke

## 2015-06-27 NOTE — Transfer of Care (Signed)
Immediate Anesthesia Transfer of Care Note  Patient: Tiffany [name]  Procedure(s) Performed: Procedure(s): COLONOSCOPY WITH PROPOFOL (N/A)  Patient Location: Endoscopy Unit  Anesthesia Type:MAC  Level of Consciousness: sedated  Airway & Oxygen Therapy: Patient Spontanous Breathing and Patient connected to face mask oxygen  Post-op Assessment: Report given to RN, Post -op Vital signs reviewed and stable and Patient moving all extremities  Post vital signs: Reviewed and stable  Last Vitals:  Filed Vitals:   06/27/15 1025  BP: 147/65  Pulse: 85  Temp: 36.7 C  Resp: 25    Complications: No apparent anesthesia complications

## 2015-06-27 NOTE — Anesthesia Procedure Notes (Signed)
Procedure Name: MAC Date/Time: 06/27/2015 9:54 AM Performed by: Suzy Bouchard Pre-anesthesia Checklist: Patient identified, Emergency Drugs available, Suction available, Patient being monitored and Timeout performed Patient Re-evaluated:Patient Re-evaluated prior to inductionOxygen Delivery Method: Simple face mask Preoxygenation: Pre-oxygenation with 100% oxygen

## 2015-06-27 NOTE — H&P (View-Only) (Signed)
Tiffany [name] 10:26 AM  Subjective: Patient doing well and we discussed her endoscopy yesterday and her colonoscopy coming up tomorrow and discussed her prep today as well she's had some results from her magnesium citrate but she did not look at her stools to see the color  Objective: Vital signs stable afebrile no acute distress abdomen is soft nontender hemoglobin stable  Assessment: Guaiac positive anemia questionable etiology  Plan: Colonoscopy tomorrow by Dr. Paulita Fujita at 9:30 and if no significant findings probably okay to advance diet and go home soon  [facility] E  Pager [phone] After 5PM or if no answer call [phone]

## 2015-06-27 NOTE — Op Note (Signed)
[facility] [place], [id number]   COLONOSCOPY PROCEDURE REPORT  PATIENT: Tiffany [name], Tiffany [name]  MR#: [id number] BIRTHDATE: 21-May-1929 , 85  yrs. old GENDER: female ENDOSCOPIST: Arta Silence, MD REFERRED YO:[facility] PROCEDURE DATE:  Jul 20, 2015 PROCEDURE:   Colonoscopy, diagnostic ASA CLASS:   Class III INDICATIONS:anemia, hemoccult-positive stool. MEDICATIONS: Monitored anesthesia care  DESCRIPTION OF PROCEDURE:   After the risks benefits and alternatives of the procedure were thoroughly explained, informed consent was obtained.  revealed external hemorrhoids.   The pediatric  colonoscope was introduced through the anus and advanced to the cecum, which was identified by both the appendix and ileocecal valve. No adverse events experienced.   The quality of the prep was suboptimal  The instrument was then slowly withdrawn as the colon was fully examined. Estimated blood loss is zero unless otherwise noted in this procedure report.     Findings:  Mild external hemorrhoids, otherwise normal digital rectal exam.  Mild internal hemorrhoids, small rectal vault, otherwise normal retroflexed view of rectum.  Scattered medium-sized left colonic diverticula seen.  Couple small ascending colon AVMs, non-bleeding and of doubtful significance.  Suboptimal bowel preparation, which significantly limited our views; subtle sessile lesions or lesions <8mm could have been missed.  No polyps, masses, or inflammatory changes were seen. Withdrawal time was about 10 minutes     .  The scope was withdrawn and the procedure completed.  COMPLICATIONS:  ENDOSCOPIC IMPRESSION:     Hemoccult-positive stools possibly hemorrhoidal.  No obvious source of anemia.  Limited bowel preparation as above.  RECOMMENDATIONS:     1.  Watch for potential complications of procedure. 2.  Capsule endoscopy today. 3.  If no overt bleeding, patient can likely be  discharged home tomorrow after capsule study has finished (unless there is active bleeding, the results of the capsule, which will take 8 hours to run today, do not have to be known at time of discharge, and we can follow-up with this as an outpatient). 4.  Will follow.  eSigned:  Arta Silence, MD 20-Jul-2015 10:24 AM   cc:  CPT CODES: ICD CODES:  The ICD and CPT codes recommended by this software are interpretations from the data that the clinical staff has captured with the software.  The verification of the translation of this report to the ICD and CPT codes and modifiers is the sole responsibility of the health care institution and practicing physician where this report was generated.  [employer]. will not be held responsible for the validity of the ICD and CPT codes included on this report.  AMA assumes no liability for data contained or not contained herein. CPT is a Designer, television/film set of the [employer].

## 2015-06-27 NOTE — Anesthesia Postprocedure Evaluation (Signed)
Anesthesia Post Note  Patient: Tiffany [name]  Procedure(s) Performed: Procedure(s) (LRB): COLONOSCOPY WITH PROPOFOL (N/A)  Anesthesia type: MAC  Patient location: PACU  Post pain: Pain level controlled  Post assessment: Patient's Cardiovascular Status Stable  Last Vitals:  Filed Vitals:   06/27/15 1116  BP: 148/64  Pulse: 81  Temp:   Resp: 20    Post vital signs: Reviewed and stable  Level of consciousness: sedated  Complications: No apparent anesthesia complications

## 2015-06-27 NOTE — Progress Notes (Signed)
[facility] PROGRESS NOTE  Tiffany [name] NID:[id number] DOB: 1929-08-26 DOA: 06/23/2015 PCP: Kennyth Arnold, FNP  Assessment/Plan: 1-symptomatic anemia: appears to be secondary to GIB -continue CLD -no source of bleeding on EGD; per GI capsule endoscopy today and home tomorrow if no major bleeding. Colonoscopy unrevealing for source of bleeding -continue PPI -follow Hgb trend -hemodynamically stable  -no overt bleeding appreciated  2-HTN: BP stable -continue metoprolol -will monitor VS  3-dementia:  -will continue namenda  -continue supportive care  4-chronic systolic heart failure: -stable and compensated currently  -will monitor electrolytes and renal function  -follow daily weight -continue low sodium diet -will resume lasix -continue metoprolol  5-depression: continue Remeron  6-acute on chronic renal failure: -patient with stage 3 at baseline  -worse with decrease perfusion with anemia and continue use of lasix -will continue holding diuretics for another 24 hours; patient now prepping for colonoscopy in am -gentle hydration and blood transfusion provided; will follow renal function    Code Status: Full Family Communication: husband at bedside  Disposition Plan: home when medically stable; Hgb stable and no orthostatic/lightheadedness sx's   Consultants:  [facility]   Procedures:  EGD 7/16: no acute source of bleeding identified   Colonoscopy 7/18: no major source of bleeding appreciated; some hemorrhoids seen  Capsule endoscopy: 7/18  Antibiotics:  None   HPI/Subjective: Afebrile, no CP, no SOB. Patient slightly somnolent from   Objective: Filed Vitals:   06/27/15 1350  BP: 139/55  Pulse: 79  Temp: 98.5 F (36.9 C)  Resp: 18    Intake/Output Summary (Last 24 hours) at 06/27/15 1539 Last data filed at 06/27/15 1350  Gross per 24 hour  Intake   1180 ml  Output      8 ml  Net   1172 ml   Filed Weights   06/25/15 0533 06/26/15  0452 06/27/15 0522  Weight: 62.234 kg (137 lb 3.2 oz) 61.326 kg (135 lb 3.2 oz) 61.114 kg (134 lb 11.7 oz)    Exam:   General:  Afebrile, no CP and SOB. Patient slightly somnolent from colonoscopy. No overt bleeding  Cardiovascular: S1 and s2, no rubs or gallops  Respiratory: no wheezing, no crackles  Abdomen: soft, NT, ND, positive BS  Musculoskeletal: no edema, cyanosis or clubbing   Data Reviewed: Basic Metabolic Panel:  Recent Labs Lab 06/23/15 1622 06/24/15 0845 06/25/15 0336 06/27/15 0327  NA 145 146* 142 142  K 4.8 3.7 4.0 4.0  CL 111 115* 111 110  CO2 25 25 23 22   GLUCOSE 126* 96 124* 93  BUN 48* 32* 19 14  CREATININE 1.67* 1.26* 1.21* 1.09*  CALCIUM 9.0 8.5* 8.4* 8.5*   CBC:  Recent Labs Lab 06/23/15 1622 06/24/15 0845 06/25/15 0336 06/26/15 0331 06/27/15 0327  WBC 7.9 6.0 6.7 7.7 8.0  HGB 7.4* 9.2* 9.1* 10.6* 10.2*  HCT 24.4* 28.7* 29.4* 33.3* 31.4*  MCV 94.9 93.5 92.7 93.5 91.3  PLT 152 110* 115* 128* 87*   BNP (last 3 results)  Recent Labs  03/01/15 1800 03/08/15 0407  BNP 55.6 567.5*    ProBNP (last 3 results)  Recent Labs  03/31/15 1247  PROBNP 265.0*    Studies: No results found.  Scheduled Meds: . memantine  28 mg Oral Daily  . metoprolol succinate  12.5 mg Oral Daily  . mirtazapine  15 mg Oral QHS  . pantoprazole  40 mg Oral Q1200  . sodium chloride  3 mL Intravenous Q12H   Continuous Infusions:  Principal Problem:   Acute blood loss anemia Active Problems:   History of melena   Upper GI bleeding   Systolic CHF with reduced left ventricular function, NYHA class 3   Symptomatic anemia   Dieulafoy lesion of stomach   Time spent: 30 minutes   Barton Dubois  [facility] Pager [phone]. If 7PM-7AM, please contact night-coverage at [email or web address], password [facility] 06/27/2015, 3:39 PM  LOS: 4 days

## 2015-06-27 NOTE — Anesthesia Preprocedure Evaluation (Addendum)
Anesthesia Evaluation  Patient identified by MRN, date of birth, ID band Patient awake    Reviewed: Allergy & Precautions, NPO status , Patient's Chart, lab work & pertinent test results  Airway Mallampati: II  TM Distance: >3 FB Neck ROM: Full    Dental  (+) Edentulous Upper, Poor Dentition   Pulmonary neg pulmonary ROS, pneumonia -,  + rhonchi   + decreased breath sounds      Cardiovascular hypertension, Pt. on home beta blockers + Peripheral Vascular Disease and +CHF Normal cardiovascular exam    Neuro/Psych Anxiety negative neurological ROS     GI/Hepatic Neg liver ROS,   Endo/Other  negative endocrine ROS  Renal/GU negative Renal ROS     Musculoskeletal   Abdominal   Peds  Hematology negative hematology ROS (+)   Anesthesia Other Findings   Reproductive/Obstetrics                           Anesthesia Physical Anesthesia Plan  ASA: IV  Anesthesia Plan: MAC   Post-op Pain Management:    Induction: Intravenous  Airway Management Planned: Simple Face Mask  Additional Equipment:   Intra-op Plan:   Post-operative Plan:   Informed Consent: I have reviewed the patients History and Physical, chart, labs and discussed the procedure including the risks, benefits and alternatives for the proposed anesthesia with the patient or authorized representative who has indicated his/her understanding and acceptance.   Dental advisory given  Plan Discussed with: CRNA, Anesthesiologist and Surgeon  Anesthesia Plan Comments:       Anesthesia Quick Evaluation

## 2015-06-28 DIAGNOSIS — I5032 Chronic diastolic (congestive) heart failure: Secondary | ICD-10-CM

## 2015-06-28 DIAGNOSIS — I5022 Chronic systolic (congestive) heart failure: Secondary | ICD-10-CM

## 2015-06-28 DIAGNOSIS — F0280 Dementia in other diseases classified elsewhere without behavioral disturbance: Secondary | ICD-10-CM | POA: Insufficient documentation

## 2015-06-28 DIAGNOSIS — R0902 Hypoxemia: Secondary | ICD-10-CM

## 2015-06-28 DIAGNOSIS — K219 Gastro-esophageal reflux disease without esophagitis: Secondary | ICD-10-CM | POA: Insufficient documentation

## 2015-06-28 DIAGNOSIS — G309 Alzheimer's disease, unspecified: Secondary | ICD-10-CM

## 2015-06-28 DIAGNOSIS — I10 Essential (primary) hypertension: Secondary | ICD-10-CM | POA: Insufficient documentation

## 2015-06-28 LAB — BASIC METABOLIC PANEL
ANION GAP: 9 (ref 5–15)
BUN: 12 mg/dL (ref 6–20)
CO2: 24 mmol/L (ref 22–32)
Calcium: 8.6 mg/dL — ABNORMAL LOW (ref 8.9–10.3)
Chloride: 106 mmol/L (ref 101–111)
Creatinine, Ser: 1.12 mg/dL — ABNORMAL HIGH (ref 0.44–1.00)
GFR calc non Af Amer: 44 mL/min — ABNORMAL LOW (ref >60)
GFR, EST AFRICAN AMERICAN: 51 mL/min — AB (ref >60)
Glucose, Bld: 104 mg/dL — ABNORMAL HIGH (ref 65–99)
Potassium: 3.4 mmol/L — ABNORMAL LOW (ref 3.5–5.1)
SODIUM: 139 mmol/L (ref 135–145)

## 2015-06-28 LAB — CBC
HEMATOCRIT: 33.5 % — AB (ref 36.0–46.0)
Hemoglobin: 10.4 g/dL — ABNORMAL LOW (ref 12.0–15.0)
MCH: 28.8 pg (ref 26.0–34.0)
MCHC: 31 g/dL (ref 30.0–36.0)
MCV: 92.8 fL (ref 78.0–100.0)
Platelets: 144 10*3/uL — ABNORMAL LOW (ref 150–400)
RBC: 3.61 MIL/uL — ABNORMAL LOW (ref 3.87–5.11)
RDW: 16.5 % — ABNORMAL HIGH (ref 11.5–15.5)
WBC: 8.9 10*3/uL (ref 4.0–10.5)

## 2015-06-28 MED ORDER — FUROSEMIDE 40 MG PO TABS: 20.0000 mg | ORAL_TABLET | Freq: Every day | ORAL | Status: AC

## 2015-06-28 NOTE — Discharge Summary (Signed)
Physician Discharge Summary  Tiffany [name] OQH:[id number] DOB: 05/22/1929 DOA: 06/23/2015  PCP: Kennyth Arnold, FNP  Admit date: 06/23/2015 Discharge date: 06/28/2015  Time spent: >30 minutes  Recommendations for Outpatient Follow-up:  1. Check CBC to follow Hgb trend 2. BMET to follow electrolytes and renal function 3. Please assess oxygen needs and saturation   Discharge Diagnoses:  Principal Problem:   Acute blood loss anemia Active Problems:   History of melena   Upper GI bleeding   Systolic CHF with reduced left ventricular function, NYHA class 3   Symptomatic anemia   Dieulafoy lesion of stomach   Essential hypertension   Chronic diastolic heart failure   Esophageal reflux   Hypoxemia   Alzheimer's disease acute on chronic renal failure (stage 3) Chronic systolic heart failure (EF 40-45%) Hypoxia   Discharge Condition: stable and improved. Discharge home with arrangements for oxygen supplementation. Follow up with PCP in 10 days. Patient will follow with GI service as an outpatient  Diet recommendation: heart healthy; low residue diet  Filed Weights   06/26/15 0452 06/27/15 0522 06/28/15 0529  Weight: 61.326 kg (135 lb 3.2 oz) 61.114 kg (134 lb 11.7 oz) 61.2 kg (134 lb 14.7 oz)    History of present illness:  81 y.o. female with h/o GI bleed from dieulafoy lesion of stomach in March of this year. Patient presents to the ED with 3 day history of fatigue, generalized weakness, dizziness, non-specific back pain. Patient has had black stool ever since she started iron pills, does not note any changes to her stool recently though. During ED visit her HGB was 7.4 down from 11.1 last checked in April. Her stool is hemoccult positive.  Hospital Course:  1-symptomatic anemia: appears to be secondary to GIB -advise to follow low residue heart healthy diet -no source of bleeding on EGD or colonoscopy; capsule endoscopy done and results pending at discharge -per GI,  given no further bleeding and stable Hgb; ok to d/c and they will follow in outpatient setting -continue PPI -follow Hgb trend with CBC during follow up visit  -hemodynamically stable  -no overt bleeding appreciated  2-HTN: BP stable -continue metoprolol -advise to follow heart healthy diet   3-dementia:  -will continue namenda  -continue supportive care  4-chronic systolic heart failure: -stable and compensated currently  -will recommend monitoring electrolytes and renal function at discharge -follow daily weight -continue low sodium diet -will resume lasix -continue metoprolol  5-depression: continue Remeron  6-acute on chronic renal failure: -patient with stage 3 at baseline  -back to baseline at discharge -diuretics resumed -patient advise to keep herself well hydrated -worse with decrease perfusion with anemia and continue use of lasix -gentle hydration and blood transfusion provided; will follow renal function   7-hypoxemia: -oxygen has been arranged at discharge 2-3L especially at night; but as needed during the day as well -O2 dropped to low 70's mid 60 while sleeping -in the mid 90's with 3L oxygen supplementation   Procedures:  EGD 7/16: no acute source of bleeding identified   Colonoscopy 7/18: no major source of bleeding appreciated; some hemorrhoids seen  Capsule endoscopy: 7/18 (results will be follow by GI, pending at discharge)  Consultations:  GI  Discharge Exam: Filed Vitals:   06/28/15 1315  BP: 101/47  Pulse: 83  Temp: 98.6 F (37 C)  Resp: 18     General: Afebrile, no CP and SOB. Patient w/o overt bleeding, stable vital signs and except for findings of hypoxemia (especially  when sleeping) no acute complaints.   Cardiovascular: S1 and s2, no rubs or gallops  Respiratory: no wheezing, no crackles  Abdomen: soft, NT, ND, positive BS  Musculoskeletal: no edema, cyanosis or clubbing  Discharge Instructions   Discharge  Instructions    Discharge instructions    Complete by:  As directed   Keep yourself well hydrated Please take medications as prescribed Follow up with Dr. Paulita Fujita in outpatient setting (office will contact you with appointment details) Please wear oxygen as instructed (mainly at night) Follow heart healthy diet          Current Discharge Medication List    CONTINUE these medications which have CHANGED   Details  furosemide (LASIX) 40 MG tablet Take 0.5 tablets (20 mg total) by mouth daily. Qty: 30 tablet, Refills: 5      CONTINUE these medications which have NOT CHANGED   Details  acetaminophen (TYLENOL) 500 MG tablet Take 500 mg by mouth every 6 (six) hours as needed for mild pain, moderate pain or headache.    Carboxymethylcellulose Sodium (LUBRICANT EYE DROPS OP) Place 1 drop into both eyes daily as needed (dry eyes).     IRON PO Take 1 tablet by mouth daily.     memantine (NAMENDA XR) 28 MG CP24 24 hr capsule Take 1 capsule (28 mg total) by mouth daily. Qty: 30 capsule, Refills: 3    metoprolol succinate (TOPROL-XL) 25 MG 24 hr tablet Take 0.5 tablets (12.5 mg total) by mouth daily. Qty: 15 tablet, Refills: 6    mirtazapine (REMERON) 15 MG tablet TAKE 1 TABLET (15 MG TOTAL) BY MOUTH AT BEDTIME. Qty: 90 tablet, Refills: 1    pantoprazole (PROTONIX) 40 MG tablet Take 1 tablet (40 mg total) by mouth 2 (two) times daily. Qty: 180 tablet, Refills: 1    potassium chloride SA (K-DUR,KLOR-CON) 20 MEQ tablet Take 1 tablet (20 mEq total) by mouth daily. Qty: 30 tablet, Refills: 6       No Known Allergies Follow-up Information    Follow up with Kennyth Arnold, FNP. Schedule an appointment as soon as possible for a visit in 10 days.   Specialty:  Family Medicine   Contact information:   [place] [address] [phone]       Follow up with Landry Dyke, MD.   Specialty:  Gastroenterology   Why:  office will ocntact you with appointment  details and results from capsule endoscopy    Contact information:   [address] [place] [address] [phone]       The results of significant diagnostics from this hospitalization (including imaging, microbiology, ancillary and laboratory) are listed below for reference.    Significant Diagnostic Studies: Dg Chest 2 View  06/23/2015   CLINICAL DATA:  Shortness of breath with weakness and lethargy  EXAM: CHEST  2 VIEW  COMPARISON:  Apr 25, 2015  FINDINGS: There is no edema or consolidation. The heart is upper normal in size with pulmonary vascularity within normal limits. No adenopathy. There is atherosclerotic change in aorta. There is evidence of old healed right 6, seventh, and eighth rib fractures. There is an exostosis arising from the anterior left first rib. There is degenerative change in thoracic spine.  There are surgical clips in the left upper abdomen.  IMPRESSION: No edema or consolidation.   Electronically Signed   By: Lowella Grip III M.D.   On: 06/23/2015 17:17   Labs: Basic Metabolic Panel:  Recent Labs Lab 06/23/15 1622 06/24/15 0845 06/25/15 0336 06/27/15 0327 06/28/15 0322  NA 145 146* 142 142 139  K 4.8 3.7 4.0 4.0 3.4*  CL 111 115* 111 110 106  CO2 25 25 23 22 24   GLUCOSE 126* 96 124* 93 104*  BUN 48* 32* 19 14 12   CREATININE 1.67* 1.26* 1.21* 1.09* 1.12*  CALCIUM 9.0 8.5* 8.4* 8.5* 8.6*   CBC:  Recent Labs Lab 06/24/15 0845 06/25/15 0336 06/26/15 0331 06/27/15 0327 06/28/15 0322  WBC 6.0 6.7 7.7 8.0 8.9  HGB 9.2* 9.1* 10.6* 10.2* 10.4*  HCT 28.7* 29.4* 33.3* 31.4* 33.5*  MCV 93.5 92.7 93.5 91.3 92.8  PLT 110* 115* 128* 87* 144*   BNP (last 3 results)  Recent Labs  03/01/15 1800 03/08/15 0407  BNP 55.6 567.5*    ProBNP (last 3 results)  Recent Labs  03/31/15 1247  PROBNP 265.0*    Signed:  Barton Dubois  [facility] 06/28/2015, 2:11 PM

## 2015-06-28 NOTE — Progress Notes (Signed)
Pt has orders to be discharged. Discharge instructions given and pt has no additional questions at this time. Medication regimen reviewed and pt educated. Pt verbalized understanding and has no additional questions. IV removed and site in good condition. Pt stable and waiting for transportation.   Delbert Vu RN 

## 2015-06-28 NOTE — Progress Notes (Signed)
I witnessed and observed all medications administered by Rivka Safer, Student Nurse.

## 2015-06-28 NOTE — Progress Notes (Signed)
SATURATION QUALIFICATIONS: (This note is used to comply with regulatory documentation for home oxygen)  Patient Saturations on Room Air at Rest = 66%  Patient Saturations on Room Air while Ambulating = 65%  Patient Saturations on 3 Liters of oxygen while Ambulating = 92%  Please briefly explain why patient needs home oxygen:  Significant desaturation on room air at rest as well as ambulating. Pt has had home o2 in past.

## 2015-06-28 NOTE — Care Management Note (Signed)
Case Management Note  Patient Details  Name: Tiffany [name] MRN: [id number] Date of Birth: Aug 24, 1929  Subjective/Objective:    Admitted with Acute blood loss anemia                Action/Plan: Patient lives at home with spouse. PCP - [place]; has private insurance with [facility] Medicare with prescription drug coverage. Patient stated that she does not have any problem getting her medication. [facility]. HHC choice offered, patient chose [facility]. Butch Penny with [facility] called for arrangements. Patient also needs home 02, oxygen ordered and to be delivered to the patient's room prior to discharging home. Supportive daughter at bedside also.  Expected Discharge Date:    possible 06/28/2015              Expected Discharge Plan:  [place]  Discharge planning Services  CM Consult Choice offered to:  Patient  DME Arranged:  Oxygen DME Agency:  [place] Arranged:  RN, PT [facility] Agency:  [place]  Status of Service:  In process, will continue to follow  Medicare Important Message Given:  [facility] notification given  Sherrilyn Rist [phone] 06/28/2015, 11:41 AM

## 2015-06-28 NOTE — Progress Notes (Signed)
Subjective: No blood in stool. Tolerating diet. No abdominal pain.  Objective: Vital signs in last 24 hours: Temp:  [98 F (36.7 C)-98.5 F (36.9 C)] 98 F (36.7 C) (07/19 0529) Pulse Rate:  [79-85] 83 (07/19 1001) Resp:  [18] 18 (07/19 0529) BP: (113-139)/(52-60) 113/60 mmHg (07/19 1001) SpO2:  [66 %-98 %] 92 % (07/19 1001) Weight:  [61.2 kg (134 lb 14.7 oz)] 61.2 kg (134 lb 14.7 oz) (07/19 0529) Weight change: 0.086 kg (3 oz) Last BM Date: 06/27/15  PE: GEN:  Elderly but NAD ABD:  Soft, non-tender  Lab Results: CBC    Component Value Date/Time   WBC 8.9 06/28/2015 0322   RBC 3.61* 06/28/2015 0322   HGB 10.4* 06/28/2015 0322   HCT 33.5* 06/28/2015 0322   PLT 144* 06/28/2015 0322   MCV 92.8 06/28/2015 0322   MCH 28.8 06/28/2015 0322   MCHC 31.0 06/28/2015 0322   RDW 16.5* 06/28/2015 0322   LYMPHSABS 1.5 03/31/2015 1247   MONOABS 0.7 03/31/2015 1247   EOSABS 0.3 03/31/2015 1247   BASOSABS 0.0 03/31/2015 1247   Assessment:  1.  Anemia.   No source on endoscopy and colonoscopy. 2.  Hemoccult-positive stools.  Could be multifactorial, but hemorrhoids could be playing role.  Plan:  1.  Capsule endoscopy done, results pending. 2.  OK to discharge home, and I'll follow-up capsule and make office follow-up as outpatient. 3.  Discussed with Dr. Dyann Kief; will sign-off; please call with questions; thank you for the consultation.   Landry Dyke 06/28/2015, 11:53 AM   Pager [phone] If no answer or after 5 PM call [phone]

## 2015-06-29 ENCOUNTER — Telehealth: Payer: Self-pay | Admitting: *Deleted

## 2015-06-29 NOTE — Telephone Encounter (Signed)
Left message on machine for patient to return our call 

## 2015-06-29 NOTE — Telephone Encounter (Signed)
Transition Care Management Follow-up Telephone Call  How have you been since you were released from the hospital? Doing okay - very weak   Do you understand why you were in the hospital? yes   Do you understand the discharge instrcutions? yes  Items Reviewed:  Medications reviewed: yes - started iron OTC  Allergies reviewed: yes  Dietary changes reviewed: yes  Referrals reviewed: yes   Functional Questionnaire:   Activities of Daily Living (ADLs):   She states they are independent in the following: ambulation, bathing and hygiene, grooming and dressing States they require assistance with the following: ambulation, bathing and hygiene, grooming and dressing   Any transportation issues/concerns?: no   Any patient concerns? no   Confirmed importance and date/time of follow-up visits scheduled: yes   Confirmed with patient if condition begins to worsen call PCP or go to the ER.  Patient was given the Call-a-Nurse line [phone]: yes Spoke with daughter Sherron Flemings and she will give me the name and number of a caregiver to speak to for more information. Information given by Andee Poles from [facility] Patient discharged 06/28/2015 Patient discharged to home. Patient has an appointment 07/08/15 at 9:00am with Mrs Dutch Quint

## 2015-06-30 ENCOUNTER — Encounter (HOSPITAL_COMMUNITY): Payer: Self-pay | Admitting: Gastroenterology

## 2015-07-04 ENCOUNTER — Other Ambulatory Visit: Payer: Self-pay | Admitting: *Deleted

## 2015-07-04 MED ORDER — MIRTAZAPINE 15 MG PO TABS: 15.0000 mg | ORAL_TABLET | Freq: Every day | ORAL | Status: DC

## 2015-07-04 MED ORDER — PANTOPRAZOLE SODIUM 40 MG PO TBEC: 40.0000 mg | DELAYED_RELEASE_TABLET | Freq: Two times a day (BID) | ORAL | Status: AC

## 2015-07-08 ENCOUNTER — Ambulatory Visit (INDEPENDENT_AMBULATORY_CARE_PROVIDER_SITE_OTHER): Payer: Commercial Managed Care - HMO | Admitting: Family

## 2015-07-08 ENCOUNTER — Encounter: Payer: Self-pay | Admitting: Family

## 2015-07-08 ENCOUNTER — Telehealth: Payer: Self-pay | Admitting: Family

## 2015-07-08 VITALS — BP 112/60 | HR 63 | Temp 98.1°F | Ht 62.0 in | Wt 133.5 lb

## 2015-07-08 DIAGNOSIS — E876 Hypokalemia: Secondary | ICD-10-CM | POA: Diagnosis not present

## 2015-07-08 DIAGNOSIS — F0390 Unspecified dementia without behavioral disturbance: Secondary | ICD-10-CM

## 2015-07-08 DIAGNOSIS — Z9981 Dependence on supplemental oxygen: Secondary | ICD-10-CM | POA: Diagnosis not present

## 2015-07-08 DIAGNOSIS — D649 Anemia, unspecified: Secondary | ICD-10-CM

## 2015-07-08 LAB — CBC WITH DIFFERENTIAL/PLATELET
BASOS PCT: 0.4 % (ref 0.0–3.0)
Basophils Absolute: 0 10*3/uL (ref 0.0–0.1)
EOS ABS: 0.3 10*3/uL (ref 0.0–0.7)
Eosinophils Relative: 4.3 % (ref 0.0–5.0)
HCT: 35.8 % — ABNORMAL LOW (ref 36.0–46.0)
Hemoglobin: 11.4 g/dL — ABNORMAL LOW (ref 12.0–15.0)
Lymphocytes Relative: 20.3 % (ref 12.0–46.0)
Lymphs Abs: 1.3 10*3/uL (ref 0.7–4.0)
MCHC: 31.8 g/dL (ref 30.0–36.0)
MCV: 90.1 fl (ref 78.0–100.0)
MONOS PCT: 8.5 % (ref 3.0–12.0)
Monocytes Absolute: 0.6 10*3/uL (ref 0.1–1.0)
NEUTROS PCT: 66.5 % (ref 43.0–77.0)
Neutro Abs: 4.4 10*3/uL (ref 1.4–7.7)
Platelets: 119 10*3/uL — ABNORMAL LOW (ref 150.0–400.0)
RBC: 3.97 Mil/uL (ref 3.87–5.11)
RDW: 17.1 % — AB (ref 11.5–15.5)
WBC: 6.6 10*3/uL (ref 4.0–10.5)

## 2015-07-08 LAB — BASIC METABOLIC PANEL
BUN: 31 mg/dL — ABNORMAL HIGH (ref 6–23)
CALCIUM: 9.7 mg/dL (ref 8.4–10.5)
CO2: 33 meq/L — AB (ref 19–32)
Chloride: 104 mEq/L (ref 96–112)
Creatinine, Ser: 1.28 mg/dL — ABNORMAL HIGH (ref 0.40–1.20)
GFR: 42.25 mL/min — ABNORMAL LOW (ref >60.00)
Glucose, Bld: 105 mg/dL — ABNORMAL HIGH (ref 70–99)
Potassium: 5 mEq/L (ref 3.5–5.1)
Sodium: 144 mEq/L (ref 135–145)

## 2015-07-08 NOTE — Progress Notes (Signed)
Subjective:    Patient ID: Tiffany [name], female    DOB: [birth year], 81 y.o.   MRN: [id number]  HPI  81 year old white female, nonsmoker with a history of dementia, hypertension, congestive heart failure, hypoxemia, oxygen dependence is in today as a hospital follow-up from 06/23/2015. She presented with dizziness and was found to be anemic with a hemoglobin of 7.4. She was hospitalized and underwent an endoscopy and a colonoscopy that were both negative. Was transfused with a liter of packed red blood cells that stabilized her anemia. Reports doing well. The cause for the drop in hemoglobin is still unknown. Family concerned about that. Continues to be on 2-3 L nasal cannula oxygen. Typically statins between 94-97% according to her daughter. Would like to discontinue oxygen.  Review of Systems  Constitutional: Negative.   HENT: Negative.   Respiratory: Negative.   Cardiovascular: Negative.   Gastrointestinal: Negative.   Endocrine: Negative.   Genitourinary: Negative.   Musculoskeletal: Negative.   Skin: Negative.   Allergic/Immunologic: Negative.   Neurological: Negative.   Hematological: Negative.   Psychiatric/Behavioral: Negative.    Past Medical History  Diagnosis Date  . Hypertension   . Arthritis   . Dementia   . Allergic rhinitis     History   Social History  . Marital Status: Married    Spouse Name: N/A  . Number of Children: 1  . Years of Education: N/A   Occupational History  . Retired    Social History Main Topics  . Smoking status: Never Smoker   . Smokeless tobacco: Never Used  . Alcohol Use: 0.0 oz/week    0 Standard drinks or equivalent per week     Comment: glass of wine per day  . Drug Use: No  . Sexual Activity: No   Other Topics Concern  . Not on file   Social History Narrative    Past Surgical History  Procedure Laterality Date  . Back surgery  09/25/2011  . Abdominal hysterectomy    . Esophagogastroduodenoscopy N/A 03/01/2015    Procedure: ESOPHAGOGASTRODUODENOSCOPY (EGD);  Surgeon: Arta Silence, MD;  Location: [facility];  Service: Endoscopy;  Laterality: N/A;  . Esophagogastroduodenoscopy N/A 03/02/2015    Procedure: ESOPHAGOGASTRODUODENOSCOPY (EGD);  Surgeon: Arta Silence, MD;  Location: [facility] ENDOS[facility];  Service: Endoscopy;  Laterality: N/A;  bedside  . Esophagogastroduodenoscopy (egd) with propofol N/A 06/25/2015    Procedure: ESOPHAGOGASTRODUODENOSCOPY (EGD) WITH PROPOFOL;  Surgeon: Clarene Essex, MD;  Location: [facility] ENDOSCOPY;  Service: Endoscopy;  Laterality: N/A;  . Colonoscopy with propofol N/A 06/27/2015    Procedure: COLONOSCOPY WITH PROPOFOL;  Surgeon: Arta Silence, MD;  Location: [facility] ENDOSCOPY;  Service: Endoscopy;  Laterality: N/A;  . Givens capsule study N/A 06/27/2015    Procedure: GIVENS CAPSULE STUDY;  Surgeon: Arta Silence, MD;  Location: [facility] ENDOSCOPY;  Service: Endoscopy;  Laterality: N/A;    Family History  Problem Relation Age of Onset  . Heart attack Brother   . Ovarian cancer Mother   . Colon cancer Sister   . Osteoarthritis Mother     No Known Allergies  Current Outpatient Prescriptions on File Prior to Visit  Medication Sig Dispense Refill  . acetaminophen (TYLENOL) 500 MG tablet Take 500 mg by mouth every 6 (six) hours as needed for mild pain, moderate pain or headache.    . ferrous sulfate 325 (65 FE) MG tablet Take 325 mg by mouth daily with breakfast.    . furosemide (LASIX) 40 MG tablet Take 0.5 tablets (20 mg total)  by mouth daily. 30 tablet 5  . memantine (NAMENDA XR) 28 MG CP24 24 hr capsule Take 1 capsule (28 mg total) by mouth daily. 30 capsule 3  . metoprolol succinate (TOPROL-XL) 25 MG 24 hr tablet Take 0.5 tablets (12.5 mg total) by mouth daily. 15 tablet 6  . mirtazapine (REMERON) 15 MG tablet Take 1 tablet (15 mg total) by mouth at bedtime. 90 tablet 1  . pantoprazole (PROTONIX) 40 MG tablet Take 1 tablet (40 mg total) by mouth 2 (two) times daily. 180 tablet 1  .  potassium chloride SA (K-DUR,KLOR-CON) 20 MEQ tablet Take 1 tablet (20 mEq total) by mouth daily. 30 tablet 6   No current facility-administered medications on file prior to visit.    BP 112/60 mmHg  Pulse 63  Temp(Src) 98.1 F (36.7 C) (Oral)  Ht 5\' 2"  (1.575 m)  Wt 133 lb 8 oz (60.555 kg)  BMI 24.41 kg/m2  SpO2 98%chart    Objective:   Physical Exam  Constitutional: She is oriented to person, place, and time. She appears well-developed and well-nourished.  HENT:  Right Ear: External ear normal.  Left Ear: External ear normal.  Nose: Nose normal.  Mouth/Throat: Oropharynx is clear and moist.  Neck: Normal range of motion. Neck supple.  Cardiovascular: Normal rate, regular rhythm and normal heart sounds.   Pulmonary/Chest: Effort normal and breath sounds normal.  Abdominal: Soft. Bowel sounds are normal.  Musculoskeletal: Normal range of motion.  Neurological: She is alert and oriented to person, place, and time.  Skin: Skin is warm and dry.  Psychiatric: She has a normal mood and affect.          Assessment & Plan:  Diagnoses and all orders for this visit:  Anemia, unspecified anemia type Orders: -     CBC with Differential -     Ambulatory referral to Hematology  Dementia, without behavioral disturbance  Hypokalemia Orders: -     Basic Metabolic Panel  Oxygen dependent   I have advised to contact Dr. name Quita Skye about discontinuation of the oxygen and following up. Patient and daughter verbalized understanding. We'll do a referral to hematology to evaluate anemia. Labs obtained today will notify patient pending results. Follow-up here in 3 months and sooner as needed.

## 2015-07-08 NOTE — Telephone Encounter (Signed)
Ok per [employer]. Diane is informed.

## 2015-07-08 NOTE — Progress Notes (Signed)
Pre visit review using our clinic review tool, if applicable. No additional management support is needed unless otherwise documented below in the visit note. 

## 2015-07-08 NOTE — Telephone Encounter (Signed)
Tiffany [name]  [facility]  call to ask for verbal orders home health aide to assist with showers 2 times a week for 2 more weeks which will begin next week    [phone]

## 2015-07-08 NOTE — Patient Instructions (Signed)
Anemia, Nonspecific Anemia is a condition in which the concentration of red blood cells or hemoglobin in the blood is below normal. Hemoglobin is a substance in red blood cells that carries oxygen to the tissues of the body. Anemia results in not enough oxygen reaching these tissues.  CAUSES  Common causes of anemia include:   Excessive bleeding. Bleeding may be internal or external. This includes excessive bleeding from periods (in women) or from the intestine.   Poor nutrition.   Chronic kidney, thyroid, and liver disease.  Bone marrow disorders that decrease red blood cell production.  Cancer and treatments for cancer.  HIV, AIDS, and their treatments.  Spleen problems that increase red blood cell destruction.  Blood disorders.  Excess destruction of red blood cells due to infection, medicines, and autoimmune disorders. SIGNS AND SYMPTOMS   Minor weakness.   Dizziness.   Headache.  Palpitations.   Shortness of breath, especially with exercise.   Paleness.  Cold sensitivity.  Indigestion.  Nausea.  Difficulty sleeping.  Difficulty concentrating. Symptoms may occur suddenly or they may develop slowly.  DIAGNOSIS  Additional blood tests are often needed. These help your health care provider determine the best treatment. Your health care provider will check your stool for blood and look for other causes of blood loss.  TREATMENT  Treatment varies depending on the cause of the anemia. Treatment can include:   Supplements of iron, vitamin B12, or folic acid.   Hormone medicines.   A blood transfusion. This may be needed if blood loss is severe.   Hospitalization. This may be needed if there is significant continual blood loss.   Dietary changes.  Spleen removal. HOME CARE INSTRUCTIONS Keep all follow-up appointments. It often takes many weeks to correct anemia, and having your health care provider check on your condition and your response to  treatment is very important. SEEK IMMEDIATE MEDICAL CARE IF:   You develop extreme weakness, shortness of breath, or chest pain.   You become dizzy or have trouble concentrating.  You develop heavy vaginal bleeding.   You develop a rash.   You have bloody or black, tarry stools.   You faint.   You vomit up blood.   You vomit repeatedly.   You have abdominal pain.  You have a fever or persistent symptoms for more than 2-3 days.   You have a fever and your symptoms suddenly get worse.   You are dehydrated.  MAKE SURE YOU:  Understand these instructions.  Will watch your condition.  Will get help right away if you are not doing well or get worse. Document Released: 01/03/2005 Document Revised: 07/29/2013 Document Reviewed: 05/22/2013 ExitCare Patient Information 2015 [employer]. This information is not intended to replace advice given to you by your health care provider. Make sure you discuss any questions you have with your health care provider.  

## 2015-07-18 ENCOUNTER — Telehealth: Payer: Self-pay | Admitting: Family

## 2015-07-18 NOTE — Telephone Encounter (Signed)
Diane w/ [facility] needs verbal order for approval to extend occupational therapy 2 visits , starting next week.  Justin Mend was her pcp, but needs verbal asap . pls advise

## 2015-07-18 NOTE — Telephone Encounter (Signed)
OT okay for two more visits since approved last week per [place]. Diane aware.

## 2015-07-21 ENCOUNTER — Other Ambulatory Visit: Payer: Commercial Managed Care - HMO

## 2015-07-21 ENCOUNTER — Ambulatory Visit: Payer: Commercial Managed Care - HMO

## 2015-07-21 ENCOUNTER — Ambulatory Visit (HOSPITAL_BASED_OUTPATIENT_CLINIC_OR_DEPARTMENT_OTHER): Payer: Commercial Managed Care - HMO | Admitting: Hematology

## 2015-07-21 ENCOUNTER — Encounter: Payer: Self-pay | Admitting: Hematology

## 2015-07-21 ENCOUNTER — Ambulatory Visit (HOSPITAL_BASED_OUTPATIENT_CLINIC_OR_DEPARTMENT_OTHER): Payer: Commercial Managed Care - HMO

## 2015-07-21 ENCOUNTER — Telehealth: Payer: Self-pay | Admitting: Hematology

## 2015-07-21 VITALS — BP 130/60 | HR 72 | Temp 98.7°F | Resp 17 | Ht 62.0 in | Wt 135.8 lb

## 2015-07-21 DIAGNOSIS — K922 Gastrointestinal hemorrhage, unspecified: Secondary | ICD-10-CM | POA: Diagnosis not present

## 2015-07-21 DIAGNOSIS — D696 Thrombocytopenia, unspecified: Secondary | ICD-10-CM | POA: Diagnosis not present

## 2015-07-21 DIAGNOSIS — D500 Iron deficiency anemia secondary to blood loss (chronic): Secondary | ICD-10-CM

## 2015-07-21 LAB — COMPREHENSIVE METABOLIC PANEL (CC13)
ALT: 24 U/L (ref 0–55)
ANION GAP: 8 meq/L (ref 3–11)
AST: 32 U/L (ref 5–34)
Albumin: 3.9 g/dL (ref 3.5–5.0)
Alkaline Phosphatase: 169 U/L — ABNORMAL HIGH (ref 40–150)
BILIRUBIN TOTAL: 0.29 mg/dL (ref 0.20–1.20)
BUN: 40.7 mg/dL — ABNORMAL HIGH (ref 7.0–26.0)
CALCIUM: 9.1 mg/dL (ref 8.4–10.4)
CO2: 31 mEq/L — ABNORMAL HIGH (ref 22–29)
Chloride: 108 mEq/L (ref 98–109)
Creatinine: 1.4 mg/dL — ABNORMAL HIGH (ref 0.6–1.1)
EGFR: 36 mL/min/{1.73_m2} — ABNORMAL LOW (ref >90)
GLUCOSE: 100 mg/dL (ref 70–140)
Potassium: 4.3 mEq/L (ref 3.5–5.1)
Sodium: 146 mEq/L — ABNORMAL HIGH (ref 136–145)
TOTAL PROTEIN: 6.9 g/dL (ref 6.4–8.3)

## 2015-07-21 LAB — CBC & DIFF AND RETIC
BASO%: 0.4 % (ref 0.0–2.0)
Basophils Absolute: 0 10*3/uL (ref 0.0–0.1)
EOS%: 2.9 % (ref 0.0–7.0)
Eosinophils Absolute: 0.2 10*3/uL (ref 0.0–0.5)
HEMATOCRIT: 38.6 % (ref 34.8–46.6)
HGB: 11.7 g/dL (ref 11.6–15.9)
Immature Retic Fract: 5.2 % (ref 1.60–10.00)
LYMPH%: 16.7 % (ref 14.0–49.7)
MCH: 28.9 pg (ref 25.1–34.0)
MCHC: 30.3 g/dL — AB (ref 31.5–36.0)
MCV: 95.3 fL (ref 79.5–101.0)
MONO#: 0.7 10*3/uL (ref 0.1–0.9)
MONO%: 9.5 % (ref 0.0–14.0)
NEUT#: 4.9 10*3/uL (ref 1.5–6.5)
NEUT%: 70.5 % (ref 38.4–76.8)
PLATELETS: 117 10*3/uL — AB (ref 145–400)
RBC: 4.05 10*6/uL (ref 3.70–5.45)
RDW: 15.4 % — ABNORMAL HIGH (ref 11.2–14.5)
RETIC %: 2.16 % — AB (ref 0.70–2.10)
Retic Ct Abs: 87.48 10*3/uL (ref 33.70–90.70)
WBC: 6.9 10*3/uL (ref 3.9–10.3)
lymph#: 1.2 10*3/uL (ref 0.9–3.3)

## 2015-07-21 LAB — CHCC SMEAR

## 2015-07-21 LAB — IRON AND TIBC CHCC
%SAT: 8 % — ABNORMAL LOW (ref 21–57)
Iron: 30 ug/dL — ABNORMAL LOW (ref 41–142)
TIBC: 386 ug/dL (ref 236–444)
UIBC: 357 ug/dL (ref 120–384)

## 2015-07-21 LAB — FERRITIN CHCC: FERRITIN: 23 ng/mL (ref 9–269)

## 2015-07-21 LAB — DRAW EXTRA CLOT TUBE

## 2015-07-21 NOTE — Progress Notes (Signed)
.    Hematology oncology consultation note  Date of service: 07/21/2015  Patient Care Team: Kennyth Arnold, FNP as PCP - General (Family Medicine)  CHIEF COMPLAINTS/PURPOSE OF CONSULTATION:  Anemia  HISTORY OF PRESENTING ILLNESS:  Tiffany [name] is a very pleasant 81 year old female who comes in with her daughter Tiffany [name] for evaluation and management of anemia on the request of her primary care physician .Kennyth Arnold, FNP  [name] has a history of hypertension, osteoarthritis, moderate Alzheimer's dementia (on Namenda), interstitial lung disease ( on nocturnal O2), CHF who was admitted with significant upper GI bleed and hematemesis in March 2016 and was noted to a proximal gastric dieulafoy lesion which was clipped.she had a complicated hospitalization that involved intubation for airway protection, refractory atrial fibrillation, blood transfusion for symptomatic anemia,acute systolic CHF. Her aspirin was stopped on discharge. And she was eventually discharged in stable condition on chronic O2. She has since been weaned off oxygen except at nighttime. She was readmitted again in July 2016 with symptomatic anemia thought to be related to GI bleeding. Her hemoglobin was 7.4 down from 11.1 in April and her stool Hemoccult was positive. She had extensive GI workup including EGD, colonoscopy and capsule endoscopy that did not show an obvious source of bleeding.her ferritin level was 7. She was discharged on oral iron and has been taking 1 tablet of ferrous sulfate daily. She is back at her home and appears to be getting close to her baseline. Lives with her husband who is in his 32s.  She has not noted any additional overt GI bleeding since discharge in July 2016. Her hemoglobin levels rechecked today appear to be improving and are at 11.7. Ferritin has climbed from 7 to 23 with oral iron replacement. No symptoms of symptomatic anemia. Mild thrombocytopenia that has remained stable.   Her  daughter provides a lot of information. Patient wants to know if she has cancer and she was informed that there is no evidence of that as such at this time.  Her peripheral blood smear did show pelgeroid neutrophils and some evidence of myelodysplastic syndrome. Her MCV appears to be increasing as her iron deficiency is corrected. Likely has some high normal or elevated MCV from myelodysplasia at baseline. Her B12 levels were checked in June 2016 and noted to be normal at 697  Patient notes that she is concerned about falls. She was encouraged to use a cane or a walker and maintain an alert bracelet to get help if needed and to reduce risk of falls.  MEDICAL HISTORY:  Past Medical History  Diagnosis Date  . Hypertension   . Arthritis   . Dementia   . Allergic rhinitis    Patient Active Problem List   Diagnosis Date Noted  . Essential hypertension   . Chronic diastolic heart failure   . Esophageal reflux   . Hypoxemia   . Alzheimer's disease   . Chronic systolic heart failure   . Symptomatic anemia 06/23/2015  . Dieulafoy lesion of stomach 06/23/2015  . Dementia 06/01/2015  . Alzheimer's dementia 05/12/2015  . Memory loss 04/25/2015  . Acute blood loss anemia 03/31/2015  . Systolic CHF with reduced left ventricular function, NYHA class 3 03/31/2015  . ILD (interstitial lung disease)   . Dyspnea   . Pleural effusion   . Pulmonary edema   . Atrial fibrillation with rapid ventricular response 03/06/2015  . Malnutrition of moderate degree 03/04/2015  . Acute respiratory failure with hypoxemia 03/01/2015  .  Acute respiratory failure 03/01/2015  . Upper GI bleeding 03/01/2015  . Acute post-hemorrhagic anemia 03/01/2015  . Encounter for central line placement 03/01/2015  . History of melena 02/23/2014  . Anxiety state, unspecified 01/19/2013  . Essential hypertension, benign 01/19/2013  . Allergic rhinitis 01/19/2013  . Osteoarthrosis, unspecified whether generalized or localized,  involving lower leg 01/19/2013  . Other and unspecified hyperlipidemia 01/19/2013  . Raynauds syndrome 01/19/2013     SURGICAL HISTORY: Past Surgical History  Procedure Laterality Date  . Back surgery  09/25/2011  . Abdominal hysterectomy    . Esophagogastroduodenoscopy N/A 03/01/2015    Procedure: ESOPHAGOGASTRODUODENOSCOPY (EGD);  Surgeon: Arta Silence, MD;  Location: [facility] ENDOSCOPY;  Service: Endoscopy;  Laterality: N/A;  . Esophagogastroduodenoscopy N/A 03/02/2015    Procedure: ESOPHAGOGASTRODUODENOSCOPY (EGD);  Surgeon: Arta Silence, MD;  Location: [facility] ENDOSCOPY;  Service: Endoscopy;  Laterality: N/A;  bedside  . Esophagogastroduodenoscopy (egd) with propofol N/A 06/25/2015    Procedure: ESOPHAGOGASTRODUODENOSCOPY (EGD) WITH PROPOFOL;  Surgeon: Clarene Essex, MD;  Location: [facility] ENDOSCOPY;  Service: Endoscopy;  Laterality: N/A;  . Colonoscopy with propofol N/A 06/27/2015    Procedure: COLONOSCOPY WITH PROPOFOL;  Surgeon: Arta Silence, MD;  Location: [facility] ENDOSCOPY;  Service: Endoscopy;  Laterality: N/A;  . Givens capsule study N/A 06/27/2015    Procedure: GIVENS CAPSULE STUDY;  Surgeon: Arta Silence, MD;  Location: [facility] ENDOSCOPY;  Service: Endoscopy;  Laterality: N/A;    SOCIAL HISTORY: Social History   Social History  . Marital Status: Married    Spouse Name: N/A  . Number of Children: 1  . Years of Education: N/A   Occupational History  . Retired    Social History Main Topics  . Smoking status: Never Smoker   . Smokeless tobacco: Never Used  . Alcohol Use: No  . Drug Use: No  . Sexual Activity: No   Other Topics Concern  . Not on file   Social History Narrative    FAMILY HISTORY: Family History  Problem Relation Age of Onset  . Heart attack Brother   . Ovarian cancer Mother   . Colon cancer Sister   . Osteoarthritis Mother     ALLERGIES:  has No Known Allergies.  MEDICATIONS:  Current Outpatient Prescriptions  Medication Sig Dispense Refill  .  acetaminophen (TYLENOL) 500 MG tablet Take 500 mg by mouth every 6 (six) hours as needed for mild pain, moderate pain or headache.    . ferrous sulfate 325 (65 FE) MG tablet Take 325 mg by mouth daily with breakfast.    . furosemide (LASIX) 40 MG tablet Take 0.5 tablets (20 mg total) by mouth daily. 30 tablet 5  . memantine (NAMENDA XR) 28 MG CP24 24 hr capsule Take 1 capsule (28 mg total) by mouth daily. 30 capsule 3  . metoprolol succinate (TOPROL-XL) 25 MG 24 hr tablet Take 0.5 tablets (12.5 mg total) by mouth daily. 15 tablet 6  . mirtazapine (REMERON) 15 MG tablet Take 1 tablet (15 mg total) by mouth at bedtime. 90 tablet 1  . pantoprazole (PROTONIX) 40 MG tablet Take 1 tablet (40 mg total) by mouth 2 (two) times daily. 180 tablet 1  . potassium chloride SA (K-DUR,KLOR-CON) 20 MEQ tablet Take 1 tablet (20 mEq total) by mouth daily. 30 tablet 6   No current facility-administered medications for this visit.    REVIEW OF SYSTEMS:   Constitutional: Denies fevers, chills or abnormal night sweats Eyes: Denies blurriness of vision, double vision or watery eyes Ears, nose, mouth, throat,  and face: Denies mucositis or sore throat Respiratory: Denies cough, dyspnea or wheezes Cardiovascular: Denies palpitation, chest discomfort or lower extremity swelling Gastrointestinal:  Denies nausea, heartburn or change in bowel habits Skin: Denies abnormal skin rashes Lymphatics: Denies new lymphadenopathy or easy bruising Neurological:Denies numbness, tingling or new weaknesses Behavioral/Psych: Mood is stable, no new changes  All other systems were reviewed with the patient and are negative.   PHYSICAL EXAMINATION: ECOG PERFORMANCE STATUS: 2 - Symptomatic, <50% confined to bed  Filed Vitals:   07/21/15 1116  BP: 130/60  Pulse: 72  Temp: 98.7 F (37.1 C)  Resp: 17   Filed Weights   07/21/15 1116  Weight: 135 lb 12.8 oz (61.598 kg)    GENERAL:elderly lady in no acute distress SKIN: skin  color, texture, turgor are normal, no rashes or significant lesions EYES: normal, conjunctiva are pink and non-injected, sclera clear OROPHARYNX:no exudate, no erythema and lips, buccal mucosa, and tongue normal  NECK: supple, thyroid normal size, non-tender, without nodularity LYMPH:  no palpable lymphadenopathy in the cervical, axillary or inguinal LUNGS: clear to auscultation and percussion with normal breathing effort HEART: regular rate & rhythm and no murmurs and no lower extremity edema ABDOMEN:abdomen soft, non-tender and normal bowel sounds Musculoskeletal:no cyanosis of digits and no clubbing  NEURO: no focal motor/sensory deficits, issues with short-term recall in keeping with her history of moderate Alzheimer's dementia  LABORATORY DATA:  . CBC Latest Ref Rng 07/21/2015 07/08/2015 06/28/2015  WBC 3.9 - 10.3 10e3/uL 6.9 6.6 8.9  Hemoglobin 11.6 - 15.9 g/dL 11.7 11.4(L) 10.4(L)  Hematocrit 34.8 - 46.6 % 38.6 35.8(L) 33.5(L)  Platelets 145 - 400 10e3/uL 117(L) 119.0(L) 144(L)    . CMP Latest Ref Rng 07/21/2015 07/08/2015 06/28/2015  Glucose 70 - 140 mg/dl 100 105(H) 104(H)  BUN 7.0 - 26.0 mg/dL 40.7(H) 31(H) 12  Creatinine 0.6 - 1.1 mg/dL 1.4(H) 1.28(H) 1.12(H)  Sodium 136 - 145 mEq/L 146(H) 144 139  Potassium 3.5 - 5.1 mEq/L 4.3 5.0 3.4(L)  Chloride 96 - 112 mEq/L - 104 106  CO2 22 - 29 mEq/L 31(H) 33(H) 24  Calcium 8.4 - 10.4 mg/dL 9.1 9.7 8.6(L)  Total Protein 6.4 - 8.3 g/dL 6.9 - -  Total Bilirubin 0.20 - 1.20 mg/dL 0.29 - -  Alkaline Phos 40 - 150 U/L 169(H) - -  AST 5 - 34 U/L 32 - -  ALT 0 - 55 U/L 24 - -    . Lab Results  Component Value Date   IRON 30* 07/21/2015   TIBC 386 07/21/2015   IRONPCTSAT 8* 07/21/2015   (Iron and TIBC)  Lab Results  Component Value Date   FERRITIN 23 07/21/2015      RADIOGRAPHIC STUDIES: I have personally reviewed the radiological images as listed and agreed with the findings in the report. Dg Chest 2 View  06/23/2015    CLINICAL DATA:  Shortness of breath with weakness and lethargy  EXAM: CHEST  2 VIEW  COMPARISON:  Apr 25, 2015  FINDINGS: There is no edema or consolidation. The heart is upper normal in size with pulmonary vascularity within normal limits. No adenopathy. There is atherosclerotic change in aorta. There is evidence of old healed right 6, seventh, and eighth rib fractures. There is an exostosis arising from the anterior left first rib. There is degenerative change in thoracic spine.  There are surgical clips in the left upper abdomen.  IMPRESSION: No edema or consolidation.   Electronically Signed   By: Gwyndolyn Saxon  Jasmine December III M.D.   On: 06/23/2015 17:17    ASSESSMENT & PLAN:   37 -year-old female with  1. Anemia that was predominantly related to iron deficiency from acute and likely chronic GI bleeding. GI bleeding was related to a diulafoy lesion in the stomach in March 2016 and was unidentified in July 2016. Patient has been off aspirin since April. She has been on ferrous sulfate 1 tablet by mouth daily with gradually improving ferritin levels. Hemoglobin today is stable at 11.7. Peripheral blood smear reviewed by me shows a dimorphic RBC population with some microcytic hypochromic RBCs and some larger RBCs. She also had pelgeroid neutrophils. Overall picture is one of iron deficiency anemia and signs of myelodysplastic syndrome. Her myelodysplastic syndrome is not causing significant cytopenias at this time. No evidence of leukemic blasts. Plan -Continue oral iron replacement with 1 tablet of ferrous sulfate one to 2 times daily. -Avoid aspirin or other NSAIDs that might increase risk of GI bleeding -Monitor for GI bleeding. -Repeat CBC with primary care physician in 2 months. -The patient is developing worsening cytopenias and recurrent anemia especially macrocytic might need additional workup for myelodysplastic syndrome. -Given patient's moderate dementia and lack of cytopenias will hold off on  additional MDS workup at this time since it is unlikely to change management. -Anemia due to MDS will likely be managed by using ESA or with transfusions.  #2., Thrombocytopenia mild and asymptomatic. CT chest showed mild splenomegaly in March 2016 -could have an element of hypersplenism. Could also be partly related to MDS. -No acute interventions are additional workup indicated at this time.   I presented the privilege of taking care of this wonderful patient. I called the patient's daughter Tiffany [name] and discussed the labs results with her.  Return to care with Dr. Irene Limbo when necessary as needed If any new questions or concerns arise. Continue follow-up with primary care physician  All questions were answered. The patient knows to call the clinic with any problems, questions or concerns. I spent 40 minutes counseling the patient face to face. The total time spent in the appointment was 50 minutes and more than 50% was on counseling.   Sullivan Lone MD [place] Hematology/Oncology Physician [facility]  (Office):       [phone] (Work cell):  [phone] (Fax):           [phone]

## 2015-07-21 NOTE — Telephone Encounter (Signed)
Sent pt back to labs per 08/11 POF, states f/u as needed.... Tiffany [name] gave pt AVS

## 2015-07-21 NOTE — Progress Notes (Signed)
Checked in pt for blood concern. Pt has my card for any questions or financial concerns.

## 2015-08-12 ENCOUNTER — Telehealth: Payer: Self-pay | Admitting: Pulmonary Disease

## 2015-08-12 DIAGNOSIS — J9601 Acute respiratory failure with hypoxia: Secondary | ICD-10-CM

## 2015-08-12 NOTE — Telephone Encounter (Signed)
Patient had GI bleed and resp problems and was put in hospital.  She was put on Oxygen while in hospital.  She was sent home with oxygen.  She has received clean bill of health from Hematologist and PCP.  Patient's O2 stat has stayed at 97% on room air.  She is not using the oxygen at all.  She called [facility] to request that they pick up the tanks, but they said that they would need an order before they can pick up the tanks.  She called her PCP and the PCP told her that they would prefer that her Pulmonologist D/C the oxygen.  She wants to know if she needs to come in for an OV or can we just send an order to D/C oxygen?  Dr. Lenna Gilford - please advise.

## 2015-08-16 NOTE — Telephone Encounter (Signed)
Apolonio Schneiders, please have SN advise on d/c O2. Thanks.

## 2015-08-17 NOTE — Telephone Encounter (Signed)
Order placed -- lmomtcb to inform pt. 

## 2015-08-17 NOTE — Telephone Encounter (Signed)
Per SN-okay to place order for DME to d/c O2 and pick up O2 from patient. Thanks.

## 2015-08-18 NOTE — Telephone Encounter (Signed)
Called [place] and LMTCB x1

## 2015-08-19 NOTE — Telephone Encounter (Signed)
Spoke with [name] and advised that order was placed to d/c oxygen.

## 2015-08-25 ENCOUNTER — Encounter (HOSPITAL_COMMUNITY): Payer: Self-pay

## 2015-08-25 ENCOUNTER — Inpatient Hospital Stay (HOSPITAL_COMMUNITY)
Admission: EM | Admit: 2015-08-25 | Discharge: 2015-08-26 | DRG: 378 | Disposition: A | Discharge status: Home / Self Care | Payer: Commercial Managed Care - HMO | Attending: Internal Medicine | Admitting: Internal Medicine

## 2015-08-25 DIAGNOSIS — Z800 Family history of malignant neoplasm of digestive organs: Secondary | ICD-10-CM

## 2015-08-25 DIAGNOSIS — I10 Essential (primary) hypertension: Secondary | ICD-10-CM | POA: Diagnosis not present

## 2015-08-25 DIAGNOSIS — N183 Chronic kidney disease, stage 3 (moderate): Secondary | ICD-10-CM | POA: Diagnosis present

## 2015-08-25 DIAGNOSIS — K920 Hematemesis: Principal | ICD-10-CM | POA: Diagnosis present

## 2015-08-25 DIAGNOSIS — I129 Hypertensive chronic kidney disease with stage 1 through stage 4 chronic kidney disease, or unspecified chronic kidney disease: Secondary | ICD-10-CM | POA: Diagnosis present

## 2015-08-25 DIAGNOSIS — I5022 Chronic systolic (congestive) heart failure: Secondary | ICD-10-CM | POA: Diagnosis present

## 2015-08-25 DIAGNOSIS — N179 Acute kidney failure, unspecified: Secondary | ICD-10-CM | POA: Diagnosis present

## 2015-08-25 DIAGNOSIS — M1990 Unspecified osteoarthritis, unspecified site: Secondary | ICD-10-CM | POA: Diagnosis present

## 2015-08-25 DIAGNOSIS — Z8249 Family history of ischemic heart disease and other diseases of the circulatory system: Secondary | ICD-10-CM | POA: Diagnosis not present

## 2015-08-25 DIAGNOSIS — F0390 Unspecified dementia without behavioral disturbance: Secondary | ICD-10-CM | POA: Diagnosis not present

## 2015-08-25 DIAGNOSIS — Z8041 Family history of malignant neoplasm of ovary: Secondary | ICD-10-CM | POA: Diagnosis not present

## 2015-08-25 DIAGNOSIS — K922 Gastrointestinal hemorrhage, unspecified: Secondary | ICD-10-CM | POA: Diagnosis present

## 2015-08-25 DIAGNOSIS — I5042 Chronic combined systolic (congestive) and diastolic (congestive) heart failure: Secondary | ICD-10-CM | POA: Diagnosis present

## 2015-08-25 LAB — COMPREHENSIVE METABOLIC PANEL
ALBUMIN: 4.1 g/dL (ref 3.5–5.0)
ALT: 21 U/L (ref 14–54)
ANION GAP: 10 (ref 5–15)
AST: 31 U/L (ref 15–41)
Alkaline Phosphatase: 152 U/L — ABNORMAL HIGH (ref 38–126)
BILIRUBIN TOTAL: 0.4 mg/dL (ref 0.3–1.2)
BUN: 33 mg/dL — ABNORMAL HIGH (ref 6–20)
CO2: 25 mmol/L (ref 22–32)
Calcium: 8.9 mg/dL (ref 8.9–10.3)
Chloride: 108 mmol/L (ref 101–111)
Creatinine, Ser: 1.14 mg/dL — ABNORMAL HIGH (ref 0.44–1.00)
GFR calc Af Amer: 50 mL/min — ABNORMAL LOW (ref >60)
GFR calc non Af Amer: 43 mL/min — ABNORMAL LOW (ref >60)
Glucose, Bld: 106 mg/dL — ABNORMAL HIGH (ref 65–99)
Potassium: 4 mmol/L (ref 3.5–5.1)
Sodium: 143 mmol/L (ref 135–145)
TOTAL PROTEIN: 7.1 g/dL (ref 6.5–8.1)

## 2015-08-25 LAB — TYPE AND SCREEN
ABO/RH(D): B NEG
Antibody Screen: NEGATIVE

## 2015-08-25 LAB — CBC WITH DIFFERENTIAL/PLATELET
BASOS ABS: 0 10*3/uL (ref 0.0–0.1)
Basophils Relative: 0 %
Eosinophils Absolute: 0.2 10*3/uL (ref 0.0–0.7)
Eosinophils Relative: 3 %
HEMATOCRIT: 38.9 % (ref 36.0–46.0)
Hemoglobin: 12.1 g/dL (ref 12.0–15.0)
LYMPHS ABS: 1.2 10*3/uL (ref 0.7–4.0)
Lymphocytes Relative: 17 %
MCH: 29 pg (ref 26.0–34.0)
MCHC: 31.1 g/dL (ref 30.0–36.0)
MCV: 93.3 fL (ref 78.0–100.0)
MONO ABS: 0.6 10*3/uL (ref 0.1–1.0)
MONOS PCT: 9 %
NEUTROS ABS: 5.1 10*3/uL (ref 1.7–7.7)
Neutrophils Relative %: 71 %
Platelets: 111 10*3/uL — ABNORMAL LOW (ref 150–400)
RBC: 4.17 MIL/uL (ref 3.87–5.11)
RDW: 14.8 % (ref 11.5–15.5)
WBC: 7.1 10*3/uL (ref 4.0–10.5)

## 2015-08-25 LAB — URINE MICROSCOPIC-ADD ON

## 2015-08-25 LAB — URINALYSIS, ROUTINE W REFLEX MICROSCOPIC
Bilirubin Urine: NEGATIVE
Glucose, UA: NEGATIVE mg/dL
Hgb urine dipstick: NEGATIVE
Ketones, ur: NEGATIVE mg/dL
Nitrite: NEGATIVE
Protein, ur: NEGATIVE mg/dL
Specific Gravity, Urine: 1.019 (ref 1.005–1.030)
Urobilinogen, UA: 1 mg/dL (ref 0.0–1.0)
pH: 6 (ref 5.0–8.0)

## 2015-08-25 LAB — POC OCCULT BLOOD, ED: FECAL OCCULT BLD: POSITIVE — AB

## 2015-08-25 MED ORDER — ONDANSETRON HCL 4 MG/2ML IJ SOLN
4.0000 mg | Freq: Once | INTRAMUSCULAR | Status: AC
  Administered 2015-08-25: 4 mg via INTRAVENOUS
  Filled 2015-08-25: qty 2

## 2015-08-25 MED ORDER — PANTOPRAZOLE SODIUM 40 MG IV SOLR: 40.0000 mg | Freq: Two times a day (BID) | INTRAVENOUS | Status: DC

## 2015-08-25 MED ORDER — SODIUM CHLORIDE 0.9 % IV BOLUS (SEPSIS)
500.0000 mL | Freq: Once | INTRAVENOUS | Status: AC
  Administered 2015-08-25: 500 mL via INTRAVENOUS

## 2015-08-25 MED ORDER — SODIUM CHLORIDE 0.9 % IV SOLN
8.0000 mg/h | INTRAVENOUS | Status: DC
  Administered 2015-08-26: 8 mg/h via INTRAVENOUS
  Filled 2015-08-25 (×3): qty 80

## 2015-08-25 MED ORDER — SODIUM CHLORIDE 0.9 % IV SOLN
80.0000 mg | Freq: Once | INTRAVENOUS | Status: AC
  Administered 2015-08-26: 80 mg via INTRAVENOUS
  Filled 2015-08-25: qty 80

## 2015-08-25 MED ORDER — SODIUM CHLORIDE 0.9 % IV SOLN
INTRAVENOUS | Status: DC
  Administered 2015-08-25: via INTRAVENOUS

## 2015-08-25 MED ORDER — PANTOPRAZOLE SODIUM 40 MG IV SOLR
40.0000 mg | Freq: Once | INTRAVENOUS | Status: AC
  Administered 2015-08-25: 40 mg via INTRAVENOUS
  Filled 2015-08-25: qty 40

## 2015-08-25 MED ORDER — ZOLPIDEM TARTRATE 5 MG PO TABS
5.0000 mg | ORAL_TABLET | Freq: Once | ORAL | Status: AC
  Administered 2015-08-26: 5 mg via ORAL
  Filled 2015-08-25: qty 1

## 2015-08-25 NOTE — ED Provider Notes (Signed)
CSN: [id number]     Arrival date & time 08/25/15  1638 History   First MD Initiated Contact with Patient 08/25/15 1657     Chief Complaint  Patient presents with  . Hematemesis     (Consider location/radiation/quality/duration/timing/severity/associated sxs/prior Treatment) HPI..... Level V caveat for mild dementia. Status post hematemesis 3 episodes earlier today. No epigastric pain. Positive black stools.  Patient takes iron. Status post endoscopy 3 this year with uncertain results. Patient is hemodynamically stable.  Past Medical History  Diagnosis Date  . Hypertension   . Arthritis   . Dementia   . Allergic rhinitis    Past Surgical History  Procedure Laterality Date  . Back surgery  09/25/2011  . Abdominal hysterectomy    . Esophagogastroduodenoscopy N/A 03/01/2015    Procedure: ESOPHAGOGASTRODUODENOSCOPY (EGD);  Surgeon: Arta Silence, MD;  Location: [facility] ENDOSCOPY;  Service: Endoscopy;  Laterality: N/A;  . Esophagogastroduodenoscopy N/A 03/02/2015    Procedure: ESOPHAGOGASTRODUODENOSCOPY (EGD);  Surgeon: Arta Silence, MD;  Location: [facility] ENDOSCOPY;  Service: Endoscopy;  Laterality: N/A;  bedside  . Esophagogastroduodenoscopy (egd) with propofol N/A 06/25/2015    Procedure: ESOPHAGOGASTRODUODENOSCOPY (EGD) WITH PROPOFOL;  Surgeon: Clarene Essex, MD;  Location: [facility] ENDOSCOPY;  Service: Endoscopy;  Laterality: N/A;  . Colonoscopy with propofol N/A 06/27/2015    Procedure: COLONOSCOPY WITH PROPOFOL;  Surgeon: Arta Silence, MD;  Location: [facility] ENDOSCOPY;  Service: Endoscopy;  Laterality: N/A;  . Givens capsule study N/A 06/27/2015    Procedure: GIVENS CAPSULE STUDY;  Surgeon: Arta Silence, MD;  Location: [facility] ENDOSCOPY;  Service: Endoscopy;  Laterality: N/A;   Family History  Problem Relation Age of Onset  . Heart attack Brother   . Ovarian cancer Mother   . Colon cancer Sister   . Osteoarthritis Mother    Social History  Substance Use Topics  . Smoking status: Never Smoker   .  Smokeless tobacco: Never Used  . Alcohol Use: No   OB History    No data available     Review of Systems  Unable to perform ROS: Dementia      Allergies  Review of patient's allergies indicates no known allergies.  Home Medications   Prior to Admission medications   Medication Sig Start Date End Date Taking? Authorizing Provider  acetaminophen (TYLENOL) 500 MG tablet Take 500 mg by mouth every 6 (six) hours as needed for mild pain, moderate pain or headache.   Yes Historical Provider, MD  Famotidine (PEPCID PO) Take 1 tablet by mouth once.   Yes Historical Provider, MD  ferrous sulfate 325 (65 FE) MG tablet Take 325 mg by mouth daily with breakfast.   Yes Historical Provider, MD  furosemide (LASIX) 40 MG tablet Take 0.5 tablets (20 mg total) by mouth daily. 06/28/15  Yes Barton Dubois, MD  memantine (NAMENDA XR) 28 MG CP24 24 hr capsule Take 1 capsule (28 mg total) by mouth daily. 05/30/15  Yes Pieter Partridge, DO  metoprolol succinate (TOPROL-XL) 25 MG 24 hr tablet Take 0.5 tablets (12.5 mg total) by mouth daily. 05/06/15  Yes Pixie Casino, MD  mirtazapine (REMERON) 15 MG tablet Take 1 tablet (15 mg total) by mouth at bedtime. 07/04/15  Yes Kennyth Arnold, FNP  pantoprazole (PROTONIX) 40 MG tablet Take 1 tablet (40 mg total) by mouth 2 (two) times daily. 07/04/15  Yes Kennyth Arnold, FNP  potassium chloride SA (K-DUR,KLOR-CON) 20 MEQ tablet Take 1 tablet (20 mEq total) by mouth daily. 04/04/15  Yes Noralee Space, MD  BP 154/70 mmHg  Pulse 64  Temp(Src) 98.4 F (36.9 C) (Oral)  Resp 19  SpO2 96% Physical Exam  Constitutional: She is oriented to person, place, and time. She appears well-developed and well-nourished.  No acute distress  HENT:  Head: Normocephalic and atraumatic.  Red blood in patient's mouth.  Eyes: Conjunctivae and EOM are normal. Pupils are equal, round, and reactive to light.  Neck: Normal range of motion. Neck supple.  Cardiovascular: Normal rate and regular  rhythm.   Pulmonary/Chest: Effort normal and breath sounds normal.  Abdominal: Soft. Bowel sounds are normal.  Genitourinary:  Rectal exam shows no masses. Heme positive.  Musculoskeletal: Normal range of motion.  Neurological: She is alert and oriented to person, place, and time.  Skin: Skin is warm and dry.  Psychiatric: She has a normal mood and affect. Her behavior is normal.  Nursing note and vitals reviewed.   ED Course  Procedures (including critical care time) Labs Review Labs Reviewed  CBC WITH DIFFERENTIAL/PLATELET - Abnormal; Notable for the following:    Platelets 111 (*)    All other components within normal limits  COMPREHENSIVE METABOLIC PANEL - Abnormal; Notable for the following:    Glucose, Bld 106 (*)    BUN 33 (*)    Creatinine, Ser 1.14 (*)    Alkaline Phosphatase 152 (*)    GFR calc non Af Amer 43 (*)    GFR calc Af Amer 50 (*)    All other components within normal limits  URINALYSIS, ROUTINE W REFLEX MICROSCOPIC (NOT AT [facility]) - Abnormal; Notable for the following:    Leukocytes, UA SMALL (*)    All other components within normal limits  URINE MICROSCOPIC-ADD ON - Abnormal; Notable for the following:    Casts HYALINE CASTS (*)    All other components within normal limits  POC OCCULT BLOOD, ED - Abnormal; Notable for the following:    Fecal Occult Bld POSITIVE (*)    All other components within normal limits    Imaging Review No results found. I have personally reviewed and evaluated these images and lab results as part of my medical decision-making.   EKG Interpretation None      MDM   Final diagnoses:  Hematemesis with nausea    Patient is hemodynamically stable. Hemoglobin 12.1   IV fluids. IV Protonix. IV Zofran. Discussed with Dr. Watt Climes.  Admit to general medicine.    Nat Christen, MD 08/25/15 2156

## 2015-08-25 NOTE — ED Notes (Addendum)
Pt states she was feeling fine today.  Started vomiting bright red blood x 3. Has hx of same and was told "GI bleed".  Pt takes iron and normally has black stools.  Cannot tell me why she takes iron.  HX of dementia. Cannot tell if it has increased.  Denies pain.  Denies nausea at this time.

## 2015-08-26 ENCOUNTER — Encounter (HOSPITAL_COMMUNITY): Payer: Self-pay | Admitting: Internal Medicine

## 2015-08-26 DIAGNOSIS — K922 Gastrointestinal hemorrhage, unspecified: Secondary | ICD-10-CM | POA: Diagnosis present

## 2015-08-26 DIAGNOSIS — F0390 Unspecified dementia without behavioral disturbance: Secondary | ICD-10-CM

## 2015-08-26 DIAGNOSIS — I5022 Chronic systolic (congestive) heart failure: Secondary | ICD-10-CM

## 2015-08-26 DIAGNOSIS — K920 Hematemesis: Principal | ICD-10-CM

## 2015-08-26 DIAGNOSIS — R110 Nausea: Secondary | ICD-10-CM

## 2015-08-26 DIAGNOSIS — I10 Essential (primary) hypertension: Secondary | ICD-10-CM

## 2015-08-26 LAB — COMPREHENSIVE METABOLIC PANEL
ALT: 16 U/L (ref 14–54)
ANION GAP: 6 (ref 5–15)
AST: 20 U/L (ref 15–41)
Albumin: 3.4 g/dL — ABNORMAL LOW (ref 3.5–5.0)
Alkaline Phosphatase: 113 U/L (ref 38–126)
BILIRUBIN TOTAL: 0.7 mg/dL (ref 0.3–1.2)
BUN: 32 mg/dL — ABNORMAL HIGH (ref 6–20)
CHLORIDE: 112 mmol/L — AB (ref 101–111)
CO2: 26 mmol/L (ref 22–32)
Calcium: 8.5 mg/dL — ABNORMAL LOW (ref 8.9–10.3)
Creatinine, Ser: 1.05 mg/dL — ABNORMAL HIGH (ref 0.44–1.00)
GFR calc Af Amer: 55 mL/min — ABNORMAL LOW (ref >60)
GFR, EST NON AFRICAN AMERICAN: 48 mL/min — AB (ref >60)
Glucose, Bld: 93 mg/dL (ref 65–99)
POTASSIUM: 3.7 mmol/L (ref 3.5–5.1)
Sodium: 144 mmol/L (ref 135–145)
TOTAL PROTEIN: 6.1 g/dL — AB (ref 6.5–8.1)

## 2015-08-26 LAB — CBC
HCT: 32.1 % — ABNORMAL LOW (ref 36.0–46.0)
HEMATOCRIT: 31.9 % — AB (ref 36.0–46.0)
HEMATOCRIT: 34.3 % — AB (ref 36.0–46.0)
HEMOGLOBIN: 10.1 g/dL — AB (ref 12.0–15.0)
HEMOGLOBIN: 10.2 g/dL — AB (ref 12.0–15.0)
Hemoglobin: 10.7 g/dL — ABNORMAL LOW (ref 12.0–15.0)
MCH: 29.2 pg (ref 26.0–34.0)
MCH: 29.4 pg (ref 26.0–34.0)
MCH: 29.4 pg (ref 26.0–34.0)
MCHC: 31.7 g/dL (ref 30.0–36.0)
MCHC: 31.8 g/dL (ref 30.0–36.0)
MCHC: 31.2 g/dL (ref 30.0–36.0)
MCV: 92.2 fL (ref 78.0–100.0)
MCV: 92.5 fL (ref 78.0–100.0)
MCV: 94.2 fL (ref 78.0–100.0)
PLATELETS: 63 10*3/uL — AB (ref 150–400)
PLATELETS: 72 10*3/uL — AB (ref 150–400)
Platelets: 63 10*3/uL — ABNORMAL LOW (ref 150–400)
RBC: 3.46 MIL/uL — ABNORMAL LOW (ref 3.87–5.11)
RBC: 3.47 MIL/uL — AB (ref 3.87–5.11)
RBC: 3.64 MIL/uL — ABNORMAL LOW (ref 3.87–5.11)
RDW: 14.5 % (ref 11.5–15.5)
RDW: 14.5 % (ref 11.5–15.5)
RDW: 14.6 % (ref 11.5–15.5)
WBC: 4.4 10*3/uL (ref 4.0–10.5)
WBC: 4.2 10*3/uL (ref 4.0–10.5)
WBC: 3.8 10*3/uL — AB (ref 4.0–10.5)

## 2015-08-26 LAB — GLUCOSE, CAPILLARY
Glucose-Capillary: 84 mg/dL (ref 65–99)
Glucose-Capillary: 88 mg/dL (ref 65–99)

## 2015-08-26 MED ORDER — SODIUM CHLORIDE 0.9 % IV SOLN
INTRAVENOUS | Status: DC
Start: 2015-08-26 — End: 2015-08-26
  Administered 2015-08-26: 02:00:00 via INTRAVENOUS

## 2015-08-26 MED ORDER — ONDANSETRON HCL 4 MG PO TABS: 4.0000 mg | ORAL_TABLET | Freq: Four times a day (QID) | ORAL | Status: DC | PRN

## 2015-08-26 MED ORDER — ONDANSETRON HCL 4 MG/2ML IJ SOLN: 4.0000 mg | Freq: Four times a day (QID) | INTRAMUSCULAR | Status: DC | PRN

## 2015-08-26 MED ORDER — ACETAMINOPHEN 650 MG RE SUPP: 650.0000 mg | Freq: Four times a day (QID) | RECTAL | Status: DC | PRN

## 2015-08-26 MED ORDER — ACETAMINOPHEN 325 MG PO TABS: 650.0000 mg | ORAL_TABLET | Freq: Four times a day (QID) | ORAL | Status: DC | PRN

## 2015-08-26 MED ORDER — METOPROLOL SUCCINATE ER 25 MG PO TB24
12.5000 mg | ORAL_TABLET | Freq: Every day | ORAL | Status: DC
  Administered 2015-08-26: 12.5 mg via ORAL
  Filled 2015-08-26: qty 1

## 2015-08-26 MED ORDER — MEMANTINE HCL ER 28 MG PO CP24
28.0000 mg | ORAL_CAPSULE | Freq: Every day | ORAL | Status: DC
  Administered 2015-08-26: 28 mg via ORAL
  Filled 2015-08-26: qty 1

## 2015-08-26 MED ORDER — MIRTAZAPINE 15 MG PO TABS
15.0000 mg | ORAL_TABLET | Freq: Every day | ORAL | Status: DC
  Administered 2015-08-26: 15 mg via ORAL
  Filled 2015-08-26: qty 1

## 2015-08-26 NOTE — Care Management Note (Signed)
Case Management Note  Patient Details  Name: Tiffany [name] MRN: [id number] Date of Birth: September 25, 1929  Subjective/Objective:  81 y/o f admitted w/Hematemesis. From home.                  Action/Plan:d/c plan home.   Expected Discharge Date:   (unknown)               Expected Discharge Plan:  Home/Self Care  In-House Referral:     Discharge planning Services  CM Consult  Post Acute Care Choice:    Choice offered to:     DME Arranged:    DME Agency:     HH Arranged:    HH Agency:     Status of Service:  In process, will continue to follow  Medicare Important Message Given:    Date Medicare IM Given:    Medicare IM give by:    Date Additional Medicare IM Given:    Additional Medicare Important Message give by:     If discussed at [place] of Stay Meetings, dates discussed:    Additional Comments:  Dessa Phi, RN 08/26/2015, 10:46 AM

## 2015-08-26 NOTE — Progress Notes (Signed)
Pt discharge home with family. Pt taken via wheel chair down to lobby. All property with patient.

## 2015-08-26 NOTE — Discharge Instructions (Signed)

## 2015-08-26 NOTE — Discharge Summary (Addendum)
Physician Discharge Summary  Tiffany [name] EAV:[id number] DOB: [birth year] DOA: 08/25/2015  PCP: Kennyth Arnold, FNP  Admit date: 08/25/2015 Discharge date: 08/26/2015  Recommendations for Outpatient Follow-up:  1. Pt will need to follow up with PCP in 2-3 weeks post discharge 2. Please obtain BMP to evaluate electrolytes and kidney function 3. Please also check CBC to evaluate Hg and Hct levels 4. Pt advised she needs to see her Gi doctor for further evaluation as she wants to go home today   Discharge Diagnoses:  Principal Problem:   Hematemesis Active Problems:   Dementia   Essential hypertension   GI bleed  Discharge Condition: Stable  Diet recommendation: Heart healthy diet discussed in details   History of present illness:   81 y.o. female with a history of systolic and diastolic CHF, hypertension, dementia who was admitted in July 2016 for symptomatic anemia and at that time workup including EGD and colonoscopy and capsule endoscopy did not show any definite source of bleed and also was admitted in March 2016 for acute GI bleed presented to the ER with complaints of hematemesis.   Hospital Course:  Principal Problem:   Hematemesis - resolved at this time - spoke with GI oncall, no indication for an intervention - outpatient follow up recommended - pt wants to go home  Active Problems:   Acute renal failure - pre renal in etiology - improved with IVF   Dementia - stable and at baseline   Essential hypertension - reasonable inpatient control    Chronic systolic and diastolic heart failure - last 2 D ECHO 02/2015 with EF 40-45% and grade I diastolic CHF - compensated at this time  Procedures/Studies:  None  Consultations:  GI on call Dr. Cristina Gong  Antibiotics:  None  Discharge Exam: Filed Vitals:   08/26/15 0518  BP: 142/60  Pulse: 70  Temp: 98 F (36.7 C)  Resp: 14   Filed Vitals:   08/25/15 2300 08/25/15 2307 08/26/15 0130 08/26/15 0518   BP:  154/61  142/60  Pulse:  65  70  Temp:  98.1 F (36.7 C)  98 F (36.7 C)  TempSrc:  Oral  Oral  Resp:  18  14  Height: 5\' 1"  (1.549 m)     Weight: 63.7 kg (140 lb 6.9 oz)     SpO2:  98% 98% 92%    General: Pt is alert, follows commands appropriately, not in acute distress Cardiovascular: Regular rate and rhythm, S1/S2 +, no murmurs, no rubs, no gallops Respiratory: Clear to auscultation bilaterally, no wheezing, no crackles, no rhonchi Abdominal: Soft, non tender, non distended, bowel sounds +, no guarding Extremities: no edema, no cyanosis, pulses palpable bilaterally DP and PT Neuro: Grossly nonfocal  Discharge Instructions  Discharge Instructions    Diet - low sodium heart healthy    Complete by:  As directed      Increase activity slowly    Complete by:  As directed             Medication List    TAKE these medications        acetaminophen 500 MG tablet  Commonly known as:  TYLENOL  Take 500 mg by mouth every 6 (six) hours as needed for mild pain, moderate pain or headache.     ferrous sulfate 325 (65 FE) MG tablet  Take 325 mg by mouth daily with breakfast.     furosemide 40 MG tablet  Commonly known as:  LASIX  Take 0.5  tablets (20 mg total) by mouth daily.     memantine 28 MG Cp24 24 hr capsule  Commonly known as:  NAMENDA XR  Take 1 capsule (28 mg total) by mouth daily.     metoprolol succinate 25 MG 24 hr tablet  Commonly known as:  TOPROL-XL  Take 0.5 tablets (12.5 mg total) by mouth daily.     mirtazapine 15 MG tablet  Commonly known as:  REMERON  Take 1 tablet (15 mg total) by mouth at bedtime.     pantoprazole 40 MG tablet  Commonly known as:  PROTONIX  Take 1 tablet (40 mg total) by mouth 2 (two) times daily.     PEPCID PO  Take 1 tablet by mouth once.     potassium chloride SA 20 MEQ tablet  Commonly known as:  K-DUR,KLOR-CON  Take 1 tablet (20 mEq total) by mouth daily.           Follow-up Information    Follow up with  Kennyth Arnold, FNP.   Specialty:  Family Medicine   Contact information:   [place] [address] [phone]        The results of significant diagnostics from this hospitalization (including imaging, microbiology, ancillary and laboratory) are listed below for reference.     Microbiology: No results found for this or any previous visit (from the past 240 hour(s)).   Labs: Basic Metabolic Panel:  Recent Labs Lab 08/25/15 1736 08/26/15 0250  NA 143 144  K 4.0 3.7  CL 108 112*  CO2 25 26  GLUCOSE 106* 93  BUN 33* 32*  CREATININE 1.14* 1.05*  CALCIUM 8.9 8.5*   Liver Function Tests:  Recent Labs Lab 08/25/15 1736 08/26/15 0250  AST 31 20  ALT 21 16  ALKPHOS 152* 113  BILITOT 0.4 0.7  PROT 7.1 6.1*  ALBUMIN 4.1 3.4*   No results for input(s): LIPASE, AMYLASE in the last 168 hours. No results for input(s): AMMONIA in the last 168 hours. CBC:  Recent Labs Lab 08/25/15 1736 08/26/15 0250 08/26/15 0544 08/26/15 0920  WBC 7.1 4.4 4.2 3.8*  NEUTROABS 5.1  --   --   --   HGB 12.1 10.1* 10.2* 10.7*  HCT 38.9 31.9* 32.1* 34.3*  MCV 93.3 92.2 92.5 94.2  PLT 111* 63* 63* 72*   Cardiac Enzymes: No results for input(s): CKTOTAL, CKMB, CKMBINDEX, TROPONINI in the last 168 hours. BNP: BNP (last 3 results)  Recent Labs  03/01/15 1800 03/08/15 0407  BNP 55.6 567.5*    ProBNP (last 3 results)  Recent Labs  03/31/15 1247  PROBNP 265.0*    CBG:  Recent Labs Lab 08/26/15 0718  GLUCAP 84     SIGNED: Time coordinating discharge: Over 30 minutes  Faye Ramsay, MD  [facility] 08/26/2015, 11:01 AM Pager [phone]  If 7PM-7AM, please contact night-coverage [email or web address] Password [email or web address]

## 2015-08-26 NOTE — Progress Notes (Signed)
Pt evaluated by Dr Doyle Askew this morning and will be discharged home with follow up with GI in one week. Pt and family is in agreement with plans. Drips and fluids discontinued. Pt up to the shower. Will eat while waiting discharge paperwork.

## 2015-08-26 NOTE — H&P (Signed)
[facility] History and Physical  Tiffany [name] EQA:[id number] DOB: 05/05/1929 DOA: 08/25/2015  Referring physician: Dr.Nguyen. PCP: Kennyth Arnold, FNP  Specialists: [facility].  Chief Complaint: Throwing up blood.  HPI: Tiffany [name] is a 81 y.o. female with a history of CHF, hypertension, dementia who was admitted in July 2016 for symptomatic anemia and at that time workup including EGD and colonoscopy and capsule endoscopy did not show any definite source of bleed and also was admitted in March 2016 for acute GI bleed presents to the ER with complaints of hematemesis. Patient states she had at least 3 episodes of hematemesis prior to arrival. Denies any abdominal pain or diarrhea. Patient denies using any Goody powder aspirin or NSAIDs. In the ER patient was found to be hemodynamically stable and admitted for further management of GI bleed. Patient otherwise denies any chest pain or shortness of breath or palpitations.  Review of Systems: As presented in the history of presenting illness, rest negative.  Past Medical History  Diagnosis Date  . Hypertension   . Arthritis   . Dementia   . Allergic rhinitis    Past Surgical History  Procedure Laterality Date  . Back surgery  09/25/2011  . Abdominal hysterectomy    . Esophagogastroduodenoscopy N/A 03/01/2015    Procedure: ESOPHAGOGASTRODUODENOSCOPY (EGD);  Surgeon: Arta Silence, MD;  Location: [facility];  Service: Endoscopy;  Laterality: N/A;  . Esophagogastroduodenoscopy N/A 03/02/2015    Procedure: ESOPHAGOGASTRODUODENOSCOPY (EGD);  Surgeon: Arta Silence, MD;  Location: [facility] END[facility];  Service: Endoscopy;  Laterality: N/A;  bedside  . Esophagogastroduodenoscopy (egd) with propofol N/A 06/25/2015    Procedure: ESOPHAGOGASTRODUODENOSCOPY (EGD) WITH PROPOFOL;  Surgeon: Clarene Essex, MD;  Location: [facility] ENDOSCOPY;  Service: Endoscopy;  Laterality: N/A;  . Colonoscopy with propofol N/A 06/27/2015    Procedure: COLONOSCOPY WITH  PROPOFOL;  Surgeon: Arta Silence, MD;  Location: [facility] ENDOSCOPY;  Service: Endoscopy;  Laterality: N/A;  . Givens capsule study N/A 06/27/2015    Procedure: GIVENS CAPSULE STUDY;  Surgeon: Arta Silence, MD;  Location: [facility] ENDOSCOPY;  Service: Endoscopy;  Laterality: N/A;   Social History:  reports that she has never smoked. She has never used smokeless tobacco. She reports that she does not drink alcohol or use illicit drugs. Where does patient live home. Can patient participate in ADLs? Yes.  No Known Allergies  Family History:  Family History  Problem Relation Age of Onset  . Heart attack Brother   . Ovarian cancer Mother   . Colon cancer Sister   . Osteoarthritis Mother       Prior to Admission medications   Medication Sig Start Date End Date Taking? Authorizing Provider  acetaminophen (TYLENOL) 500 MG tablet Take 500 mg by mouth every 6 (six) hours as needed for mild pain, moderate pain or headache.   Yes Historical Provider, MD  Famotidine (PEPCID PO) Take 1 tablet by mouth once.   Yes Historical Provider, MD  ferrous sulfate 325 (65 FE) MG tablet Take 325 mg by mouth daily with breakfast.   Yes Historical Provider, MD  furosemide (LASIX) 40 MG tablet Take 0.5 tablets (20 mg total) by mouth daily. 06/28/15  Yes Barton Dubois, MD  memantine (NAMENDA XR) 28 MG CP24 24 hr capsule Take 1 capsule (28 mg total) by mouth daily. 05/30/15  Yes Pieter Partridge, DO  metoprolol succinate (TOPROL-XL) 25 MG 24 hr tablet Take 0.5 tablets (12.5 mg total) by mouth daily. 05/06/15  Yes Pixie Casino, MD  mirtazapine (  REMERON) 15 MG tablet Take 1 tablet (15 mg total) by mouth at bedtime. 07/04/15  Yes Kennyth Arnold, FNP  pantoprazole (PROTONIX) 40 MG tablet Take 1 tablet (40 mg total) by mouth 2 (two) times daily. 07/04/15  Yes Kennyth Arnold, FNP  potassium chloride SA (K-DUR,KLOR-CON) 20 MEQ tablet Take 1 tablet (20 mEq total) by mouth daily. 04/04/15  Yes Noralee Space, MD    Physical Exam: Filed  Vitals:   08/25/15 2200 08/25/15 2230 08/25/15 2300 08/25/15 2307  BP: 142/63 156/56  154/61  Pulse: 65 63  65  Temp:    98.1 F (36.7 C)  TempSrc:    Oral  Resp: 10 15  18   Height:   5\' 1"  (1.549 m)   Weight:   63.7 kg (140 lb 6.9 oz)   SpO2: 98% 95%  98%     General:  Moderately built and nourished.  Eyes: Anicteric. no pallor.  ENT: No discharge from the ears eyes nose and mouth.  Neck: No mass felt. No JVD appreciated.  Cardiovascular: S1 and S2 heard.  Respiratory: No rhonchi or crepitations.  Abdomen: Soft nontender bowel sounds present. No guarding or rigidity.  Skin: No rash.  Musculoskeletal: No edema.  Psychiatric: Appears normal.  Neurologic: Alert awake oriented to time place and person. Moves all extremities.  Labs on Admission:  Basic Metabolic Panel:  Recent Labs Lab 08/25/15 1736  NA 143  K 4.0  CL 108  CO2 25  GLUCOSE 106*  BUN 33*  CREATININE 1.14*  CALCIUM 8.9   Liver Function Tests:  Recent Labs Lab 08/25/15 1736  AST 31  ALT 21  ALKPHOS 152*  BILITOT 0.4  PROT 7.1  ALBUMIN 4.1   No results for input(s): LIPASE, AMYLASE in the last 168 hours. No results for input(s): AMMONIA in the last 168 hours. CBC:  Recent Labs Lab 08/25/15 1736  WBC 7.1  NEUTROABS 5.1  HGB 12.1  HCT 38.9  MCV 93.3  PLT 111*   Cardiac Enzymes: No results for input(s): CKTOTAL, CKMB, CKMBINDEX, TROPONINI in the last 168 hours.  BNP (last 3 results)  Recent Labs  03/01/15 1800 03/08/15 0407  BNP 55.6 567.5*    ProBNP (last 3 results)  Recent Labs  03/31/15 1247  PROBNP 265.0*    CBG: No results for input(s): GLUCAP in the last 168 hours.  Radiological Exams on Admission: No results found.   Assessment/Plan Principal Problem:   Hematemesis Active Problems:   Dementia   Essential hypertension   Chronic systolic heart failure   GI bleed   1. Hematemesis - patient has had extensive studies in July 2016 for symptomatic  anemia with EGD colonoscopy and capsule endoscopy which were unrevealing. At this time I have placed patient takes her medications with Protonix infusion. We'll closely follow CBC. Type and screen. Consult GI in a.m. 2. Hypertension - we'll continue metoprolol. Closely follow blood pressure trends. 3. History of chronic systolic and diastolic CHF last EF measured was 40-45% with grade 1 diastolic dysfunction in March 2016 - holding of Lasix for now as patient has hematemesis. 4. Chronic kidney disease stage III - creatinine appears to be at baseline. Follow metabolic panel. 5. Dementia - no acute issues at this time.  I have reviewed patient's old charts and labs.   DVT Prophylaxis SCDs.  Code Status: Full code.  Family Communication: Discussed with patient.  Disposition Plan: Admit to inpatient.    KAKRAKANDY,ARSHAD N. [facility] Pager [phone].  If 7PM-7AM, please contact night-coverage [email or web address] Password [email or web address] 08/26/2015, 1:30 AM

## 2015-08-26 NOTE — Care Management Note (Signed)
Case Management Note  Patient Details  Name: Tiffany [name] MRN: [id number] Date of Birth: 29-May-1929  Subjective/Objective:  Observation status.Condition code 44 given to patient. Patient voiced understanding.                  Action/Plan:d/c home no needs or orders.   Expected Discharge Date:   (unknown)               Expected Discharge Plan:  Home/Self Care  In-House Referral:     Discharge planning Services  CM Consult  Post Acute Care Choice:    Choice offered to:     DME Arranged:    DME Agency:     HH Arranged:    [place] Agency:     Status of Service:  Completed, signed off  Medicare Important Message Given:    Date Medicare IM Given:    Medicare IM give by:    Date Additional Medicare IM Given:    Additional Medicare Important Message give by:     If discussed at [place] of Stay Meetings, dates discussed:    Additional Comments:  Dessa Phi, RN 08/26/2015, 12:27 PM

## 2015-09-14 ENCOUNTER — Other Ambulatory Visit: Payer: Self-pay | Admitting: Family

## 2015-10-05 ENCOUNTER — Ambulatory Visit (INDEPENDENT_AMBULATORY_CARE_PROVIDER_SITE_OTHER): Payer: Commercial Managed Care - HMO | Admitting: Family

## 2015-10-05 ENCOUNTER — Encounter: Payer: Self-pay | Admitting: Family

## 2015-10-05 VITALS — BP 122/80 | HR 72 | Temp 97.7°F | Resp 16 | Ht 61.0 in | Wt 138.3 lb

## 2015-10-05 DIAGNOSIS — J309 Allergic rhinitis, unspecified: Secondary | ICD-10-CM

## 2015-10-05 DIAGNOSIS — G4700 Insomnia, unspecified: Secondary | ICD-10-CM

## 2015-10-05 MED ORDER — FLUTICASONE PROPIONATE 50 MCG/ACT NA SUSP: 2.0000 | Freq: Every day | NASAL | Status: DC

## 2015-10-05 NOTE — Patient Instructions (Signed)
Allergic Rhinitis Allergic rhinitis is when the mucous membranes in the nose respond to allergens. Allergens are particles in the air that cause your body to have an allergic reaction. This causes you to release allergic antibodies. Through a chain of events, these eventually cause you to release histamine into the blood stream. Although meant to protect the body, it is this release of histamine that causes your discomfort, such as frequent sneezing, congestion, and an itchy, runny nose.  CAUSES Seasonal allergic rhinitis (hay fever) is caused by pollen allergens that may come from grasses, trees, and weeds. Year-round allergic rhinitis (perennial allergic rhinitis) is caused by allergens such as house dust mites, pet dander, and mold spores. SYMPTOMS  Nasal stuffiness (congestion).  Itchy, runny nose with sneezing and tearing of the eyes. DIAGNOSIS Your health care provider can help you determine the allergen or allergens that trigger your symptoms. If you and your health care provider are unable to determine the allergen, skin or blood testing may be used. Your health care provider will diagnose your condition after taking your health history and performing a physical exam. Your health care provider may assess you for other related conditions, such as asthma, pink eye, or an ear infection. TREATMENT Allergic rhinitis does not have a cure, but it can be controlled by:  Medicines that block allergy symptoms. These may include allergy shots, nasal sprays, and oral antihistamines.  Avoiding the allergen. Hay fever may often be treated with antihistamines in pill or nasal spray forms. Antihistamines block the effects of histamine. There are over-the-counter medicines that may help with nasal congestion and swelling around the eyes. Check with your health care provider before taking or giving this medicine. If avoiding the allergen or the medicine prescribed do not work, there are many new medicines  your health care provider can prescribe. Stronger medicine may be used if initial measures are ineffective. Desensitizing injections can be used if medicine and avoidance does not work. Desensitization is when a patient is given ongoing shots until the body becomes less sensitive to the allergen. Make sure you follow up with your health care provider if problems continue. HOME CARE INSTRUCTIONS It is not possible to completely avoid allergens, but you can reduce your symptoms by taking steps to limit your exposure to them. It helps to know exactly what you are allergic to so that you can avoid your specific triggers. SEEK MEDICAL CARE IF:  You have a fever.  You develop a cough that does not stop easily (persistent).  You have shortness of breath.  You start wheezing.  Symptoms interfere with normal daily activities.   This information is not intended to replace advice given to you by your health care provider. Make sure you discuss any questions you have with your health care provider.   Document Released: 08/21/2001 Document Revised: 12/17/2014 Document Reviewed: 08/03/2013 Elsevier Interactive Patient Education 2016 Elsevier Inc.  

## 2015-10-05 NOTE — Progress Notes (Signed)
Pre visit review using our clinic review tool, if applicable. No additional management support is needed unless otherwise documented below in the visit note. 

## 2015-10-05 NOTE — Progress Notes (Signed)
Subjective:    Patient ID: Tiffany [name], female    DOB: Aug 31, 1929, 81 y.o.   MRN: [id number]  HPI 81 year old female is in today with c/o cough, nasal congestion, and runny nose x 1 week. Has not been taking any medication for relief. No fever.    Review of Systems  Constitutional: Negative.   HENT: Positive for congestion, postnasal drip, rhinorrhea and sneezing.   Respiratory: Positive for cough. Negative for shortness of breath and wheezing.   Cardiovascular: Negative.   Gastrointestinal: Negative.   Endocrine: Negative.   Genitourinary: Negative.   Musculoskeletal: Negative.   Skin: Negative.   Allergic/Immunologic: Positive for environmental allergies. Negative for food allergies and immunocompromised state.  Neurological: Negative.   Psychiatric/Behavioral: Negative.    Past Medical History  Diagnosis Date  . Hypertension   . Arthritis   . Dementia   . Allergic rhinitis     Social History   Social History  . Marital Status: Married    Spouse Name: N/A  . Number of Children: 1  . Years of Education: N/A   Occupational History  . Retired    Social History Main Topics  . Smoking status: Never Smoker   . Smokeless tobacco: Never Used  . Alcohol Use: No  . Drug Use: No  . Sexual Activity: No   Other Topics Concern  . Not on file   Social History Narrative    Past Surgical History  Procedure Laterality Date  . Back surgery  09/25/2011  . Abdominal hysterectomy    . Esophagogastroduodenoscopy N/A 03/01/2015    Procedure: ESOPHAGOGASTRODUODENOSCOPY (EGD);  Surgeon: Arta Silence, MD;  Location: [facility] ENDOSCOPY;  Service: Endoscopy;  Laterality: N/A;  . Esophagogastroduodenoscopy N/A 03/02/2015    Procedure: ESOPHAGOGASTRODUODENOSCOPY (EGD);  Surgeon: Arta Silence, MD;  Location: [facility] ENDOSCOPY;  Service: Endoscopy;  Laterality: N/A;  bedside  . Esophagogastroduodenoscopy (egd) with propofol N/A 06/25/2015    Procedure: ESOPHAGOGASTRODUODENOSCOPY (EGD)  WITH PROPOFOL;  Surgeon: Clarene Essex, MD;  Location: [facility] ENDOSCOPY;  Service: Endoscopy;  Laterality: N/A;  . Colonoscopy with propofol N/A 06/27/2015    Procedure: COLONOSCOPY WITH PROPOFOL;  Surgeon: Arta Silence, MD;  Location: [facility] ENDOSCOPY;  Service: Endoscopy;  Laterality: N/A;  . Givens capsule study N/A 06/27/2015    Procedure: GIVENS CAPSULE STUDY;  Surgeon: Arta Silence, MD;  Location: [facility] ENDOSCOPY;  Service: Endoscopy;  Laterality: N/A;    Family History  Problem Relation Age of Onset  . Heart attack Brother   . Ovarian cancer Mother   . Colon cancer Sister   . Osteoarthritis Mother     No Known Allergies  Current Outpatient Prescriptions on File Prior to Visit  Medication Sig Dispense Refill  . acetaminophen (TYLENOL) 500 MG tablet Take 500 mg by mouth every 6 (six) hours as needed for mild pain, moderate pain or headache.    . Famotidine (PEPCID PO) Take 1 tablet by mouth once.    . ferrous sulfate 325 (65 FE) MG tablet Take 325 mg by mouth daily with breakfast.    . furosemide (LASIX) 40 MG tablet Take 0.5 tablets (20 mg total) by mouth daily. 30 tablet 5  . memantine (NAMENDA XR) 28 MG CP24 24 hr capsule Take 1 capsule (28 mg total) by mouth daily. 30 capsule 3  . metoprolol succinate (TOPROL-XL) 25 MG 24 hr tablet Take 0.5 tablets (12.5 mg total) by mouth daily. 15 tablet 6  . mirtazapine (REMERON) 15 MG tablet Take 1 tablet (15 mg  total) by mouth at bedtime. 90 tablet 1  . pantoprazole (PROTONIX) 40 MG tablet Take 1 tablet (40 mg total) by mouth 2 (two) times daily. 180 tablet 1  . potassium chloride SA (K-DUR,KLOR-CON) 20 MEQ tablet Take 1 tablet (20 mEq total) by mouth daily. 30 tablet 6   No current facility-administered medications on file prior to visit.    BP 122/80 mmHg  Pulse 72  Temp(Src) 97.7 F (36.5 C) (Oral)  Resp 16  Ht 5\' 1"  (1.549 m)  Wt 138 lb 4.8 oz (62.732 kg)  BMI 26.14 kg/m2chart    Objective:   Physical Exam  Constitutional: She is  oriented to person, place, and time. She appears well-developed and well-nourished.  HENT:  Right Ear: External ear normal.  Left Ear: External ear normal.  Mouth/Throat: Oropharynx is clear and moist.  Swollen nasal turbinates   Neck: Normal range of motion. Neck supple.  Cardiovascular: Normal rate, regular rhythm and normal heart sounds.   Pulmonary/Chest: Effort normal and breath sounds normal.  Musculoskeletal: Normal range of motion.  Neurological: She is alert and oriented to person, place, and time.  Skin: Skin is warm and dry.  Psychiatric: She has a normal mood and affect.          Assessment & Plan:  Tiffany [name] was seen today for sinusitis and insomnia.  Diagnoses and all orders for this visit:  Allergic rhinitis, unspecified allergic rhinitis type  Insomnia  Other orders -     fluticasone (FLONASE) 50 MCG/ACT nasal spray; Place 2 sprays into both nostrils daily.  Call the office with any questions or concerns. Recheck as scheduled and as needed.

## 2015-10-10 ENCOUNTER — Other Ambulatory Visit: Payer: Self-pay | Admitting: Neurology

## 2015-10-10 DIAGNOSIS — G309 Alzheimer's disease, unspecified: Principal | ICD-10-CM

## 2015-10-10 DIAGNOSIS — F0280 Dementia in other diseases classified elsewhere without behavioral disturbance: Secondary | ICD-10-CM

## 2015-10-10 NOTE — Telephone Encounter (Signed)
Last OV: 05/12/15  Next OV: 11/23/15

## 2015-11-01 ENCOUNTER — Other Ambulatory Visit: Payer: Self-pay | Admitting: Family

## 2015-11-01 MED ORDER — MIRTAZAPINE 15 MG PO TABS: 15.0000 mg | ORAL_TABLET | Freq: Every day | ORAL | Status: AC

## 2015-11-10 ENCOUNTER — Other Ambulatory Visit: Payer: Self-pay | Admitting: Pulmonary Disease

## 2015-11-18 ENCOUNTER — Ambulatory Visit: Payer: Commercial Managed Care - HMO | Admitting: Neurology

## 2015-11-23 ENCOUNTER — Other Ambulatory Visit: Payer: Self-pay

## 2015-11-23 ENCOUNTER — Ambulatory Visit (INDEPENDENT_AMBULATORY_CARE_PROVIDER_SITE_OTHER): Payer: Commercial Managed Care - HMO | Admitting: Neurology

## 2015-11-23 ENCOUNTER — Encounter: Payer: Self-pay | Admitting: Neurology

## 2015-11-23 VITALS — BP 148/86 | HR 100 | Ht 61.0 in | Wt 142.9 lb

## 2015-11-23 DIAGNOSIS — I10 Essential (primary) hypertension: Secondary | ICD-10-CM | POA: Diagnosis not present

## 2015-11-23 DIAGNOSIS — G308 Other Alzheimer's disease: Secondary | ICD-10-CM | POA: Diagnosis not present

## 2015-11-23 DIAGNOSIS — G309 Alzheimer's disease, unspecified: Principal | ICD-10-CM

## 2015-11-23 DIAGNOSIS — F0281 Dementia in other diseases classified elsewhere with behavioral disturbance: Secondary | ICD-10-CM | POA: Diagnosis not present

## 2015-11-23 DIAGNOSIS — I5040 Unspecified combined systolic (congestive) and diastolic (congestive) heart failure: Secondary | ICD-10-CM

## 2015-11-23 NOTE — Progress Notes (Signed)
NEUROLOGY FOLLOW UP OFFICE NOTE  CALLAGHAN ARA [id number]  HISTORY OF PRESENT ILLNESS: Tiffany [name] is an 81 year old right-handed woman with hypertension, dyslipidemia, Raynaud's, dementia and recent upper GI bleed and a-fib who follows up for Alzheimer's disease.  She is accompanied by her daughter who provides some history.  UPDATE: B12 was 697. She takes Namenda XR 28mg  daily.  She has been doing better.  Her mood has improved.  She does not exhibit paranoid delusions.  Memory has also improved.  She is able to set up medications herself.  She still drives down the street to the store without issue.  Sleep has improved.  Appetite has improved.  She and her husband have a social lifestyle.  They go to parties and go out to dinner with friends.  [facility]  HISTORY: Her family first noticed memory problems about 4 years ago, presenting as repeating questions or statements.  It has gradually progressed, and has significantly progressed since she had a GI bleed back in March.  She exhibits some paranoid delusions.  She feels that people are out to do her wrong.  She feels that people yell at her, steal from her or try to cheat her.  She will accuse anybody anywhere, from her husband to people working at the grocery store.  She, herself, is not combative.  She does not have any hallucinations.  She is still able to pay the bills correctly and perform activities of daily living.  She still drives, but only to the grocery store down the street.  She maintains her hygiene.  She is not incontinent.  She is not depressed.  She is no longer able to remember to take her medications, so her daughter sets it up for her.    For insomnia, she was prescribed Remeron, which has helped her sleep.  She has no known family history of dementia.  She is a Engineer, building services.  She lives with her 60 year old husband, who handles most of the cooking.  She makes sure he does not leave the  stove on.  They do not usually use the stove anyway.  PAST MEDICAL HISTORY: Past Medical History  Diagnosis Date  . Hypertension   . Arthritis   . Dementia   . Allergic rhinitis     MEDICATIONS: Current Outpatient Prescriptions on File Prior to Visit  Medication Sig Dispense Refill  . acetaminophen (TYLENOL) 500 MG tablet Take 500 mg by mouth every 6 (six) hours as needed for mild pain, moderate pain or headache.    . Famotidine (PEPCID PO) Take 1 tablet by mouth once.    . ferrous sulfate 325 (65 FE) MG tablet Take 325 mg by mouth daily with breakfast.    . fluticasone (FLONASE) 50 MCG/ACT nasal spray Place 2 sprays into both nostrils daily. 16 g 1  . furosemide (LASIX) 40 MG tablet Take 0.5 tablets (20 mg total) by mouth daily. 30 tablet 5  . metoprolol succinate (TOPROL-XL) 25 MG 24 hr tablet Take 0.5 tablets (12.5 mg total) by mouth daily. 15 tablet 6  . mirtazapine (REMERON) 15 MG tablet Take 1 tablet (15 mg total) by mouth at bedtime. 90 tablet 1  . NAMENDA XR 28 MG CP24 24 hr capsule TAKE 1 CAPSULE (28 MG TOTAL) BY MOUTH DAILY. 30 capsule 2  . pantoprazole (PROTONIX) 40 MG tablet Take 1 tablet (40 mg total) by mouth 2 (two) times daily. 180 tablet 1  .  Potassium Chloride ER 20 MEQ TBCR TAKE 1 TABLET (20 MEQ TOTAL) BY MOUTH DAILY. 30 tablet 5   No current facility-administered medications on file prior to visit.    ALLERGIES: No Known Allergies  FAMILY HISTORY: Family History  Problem Relation Age of Onset  . Heart attack Brother   . Ovarian cancer Mother   . Colon cancer Sister   . Osteoarthritis Mother     SOCIAL HISTORY: Social History   Social History  . Marital Status: Married    Spouse Name: N/A  . Number of Children: 1  . Years of Education: N/A   Occupational History  . Retired    Social History Main Topics  . Smoking status: Never Smoker   . Smokeless tobacco: Never Used  . Alcohol Use: No  . Drug Use: No  . Sexual Activity: No   Other Topics  Concern  . Not on file   Social History Narrative    REVIEW OF SYSTEMS: Constitutional: No fevers, chills, or sweats, no generalized fatigue, change in appetite Eyes: No visual changes, double vision, eye pain Ear, nose and throat: No hearing loss, ear pain, nasal congestion, sore throat Cardiovascular: No chest pain, palpitations Respiratory:  No shortness of breath at rest or with exertion, wheezes GastrointestinaI: No nausea, vomiting, diarrhea, abdominal pain, fecal incontinence Genitourinary:  No dysuria, urinary retention or frequency Musculoskeletal:  No neck pain, back pain Integumentary: No rash, pruritus, skin lesions Neurological: as above Psychiatric: No depression, insomnia, anxiety Endocrine: No palpitations, fatigue, diaphoresis, mood swings, change in appetite, change in weight, increased thirst Hematologic/Lymphatic:  No anemia, purpura, petechiae. Allergic/Immunologic: no itchy/runny eyes, nasal congestion, recent allergic reactions, rashes  PHYSICAL EXAM: Filed Vitals:   11/23/15 1316  BP: 148/86  Pulse: 100   General: No acute distress.  Patient appears well-groomed.  normal body habitus. Head:  Normocephalic/atraumatic Eyes:  Fundoscopic exam unremarkable without vessel changes, exudates, hemorrhages or papilledema. Neck: supple, no paraspinal tenderness, full range of motion Heart:  Regular rate and rhythm Lungs:  Clear to auscultation bilaterally Back: No paraspinal tenderness Neurological Exam: alert and oriented to person, place, and time. Attention span and concentration intact, delayed recall poor, remote memory intact, fund of knowledge intact.  Speech fluent and not dysarthric, language intact.   Montreal Cognitive Assessment  11/23/2015 05/12/2015  Visuospatial/ Executive (0/5) 3 3  Naming (0/3) 1 1  Attention: Read list of digits (0/2) 1 1  Attention: Read list of letters (0/1) 1 1  Attention: Serial 7 subtraction starting at 100 (0/3) 3 3    Language: Repeat phrase (0/2) 2 2  Language : Fluency (0/1) 0 0  Abstraction (0/2) 2 1  Delayed Recall (0/5) 0 0  Orientation (0/6) 5 3  Total 18 15  Adjusted Score (based on education) 19 16   CN II-XII intact. Fundoscopic exam unremarkable without vessel changes, exudates, hemorrhages or papilledema.  Bulk and tone normal, muscle strength 5/5 throughout.  Sensation to light touch intact.  Deep tendon reflexes 2+ throughout.  Finger to nose and heel to shin testing intact.  Gait normal.  IMPRESSION: Alzheimer's disease, stable HTN  PLAN: Namenda XR 28mg  daily Social activity Limit driving to the pharmacy and grocery store Continue exercise BP elevated today.  Should be rechecked and monitored by PCP Follow up in 9 months.  Metta Clines, DO  CC:  Dutch Quint, NP

## 2015-11-23 NOTE — Patient Outreach (Addendum)
[facility]) Care Management  11/23/2015  Tiffany [name] [birth year] [id number]  Telephone Screen  Referral Date:  11/15/2015 Referral Source:  [facility] tier 4 Referral Issue:  CHF with 4 ED visits and 3 admissions  Outreach call to patient.  Daughter/PCG/HCPOA/Nikki Nicole Kindred reached and completed call.   Providers: Primary MD: Dr. Justin Mend - last appt 10/05/15 States Dr. Megan Salon is down to one day a week and seeing the NP at this time.  Patient and husband desire to change Primary MD's and requesting to see Dr.Bruce WElease Hashimoto, [employer] at [facility], [address] [place], [place], [place] [address]  [phone].  States last attempt unsuccessful and MD not accepting any new patients. HH: none  Insurance: [employer] and will change to  HTA starting Jan 2017.    Social:   Patient lives in daughters home with her husband,  Daughter provides care giving to both patient and her husband.  Patient has dementia.     Mobility:  Walks with cane or walker as needed only.  Going to [employer].  Falls: 1   Patient fell walking from [facility]  to parking lot but no injuries.  Pain: none Transportation: Daughter Caregiver:  Daughter/HCPOA/Nikki Primary school teacher: private house cleaner DME: Radio producer, walker: Platform, with wheels and Rolator, W/C,   THN conditions:  CHF, HTN Admissions: 2 ER visits: 0 States reason for admissions associated to GI bleed.   Medications:  Less than 10 medications Co-pay cost issues:  Daughter states patient C/O $40.00 co-pay cost and will not always want to refill her medication and daughter will review the importance of staying on medication with patient.   Patient has been prescribed namenta instead of Aricept due to h/o GI Bleed.  Flu Vaccine:  10/05/15 PPSV23: 04/15/2014  Consent: Patient's daughter agreed to [facility] services.   Plan:  Screening and Initial Assessment completed on 11/23/2015 Referred to Telephonic Care Management services  11/23/2015 Program:  HF 11/23/2015 RN CM will follow-up with Dr. Beatrix Shipper office to check status on taking new patients.  [facility] Education ED / Hospital Utilization: -Know Before You Go Fall Prevention:  -Fall Prevention CHF: -Heart Failure: Keeping Track of Your Weight Each Day -Heart Failure:  How To Be Salt Smart -Heart Failure:  When To Call Your Doctor Or 911 -Green, Yellow, Red Heart Zone Magnet  [place] referral  -please assess for option of Namenda Medication assistance program.    RN CM notified [place] Management Assistant: agreed to services/case opened. RN CM sent successful outreach letter, [facility] Introductory package and consent for service form.  RN CM sent Physician Enrollment and Barriers Letter to Primary MD RN CM will schedule for next contact call within 30 days for telephonic monthly assessment and / or care coordination services as indicated. .  RN CM advised to please notify MD of any changes in condition prior to scheduled appt's.   RN CM provided contact name and # [phone] or main office # [phone] and 24-hour nurse line # [phone].  RN CM confirmed patient is aware of 911 services for urgent emergency needs.  Mariann Laster, RN, BSN, [facility], CCM  [employer] Management Coordinator [phone] Direct [phone] Cell [phone] Office [phone] Fax

## 2015-11-23 NOTE — Patient Instructions (Signed)
Continue Namenda XR 28mg  daily Remain socially active Limit driving to the grocery and drug store Follow up in 9 months.

## 2015-12-06 ENCOUNTER — Other Ambulatory Visit: Payer: Self-pay | Admitting: Pharmacist

## 2015-12-06 NOTE — Patient Outreach (Signed)
[facility]) Care Management  [place]   12/06/2015  [name] 18-Jun-1929 [id number]   Tiffany [name] is a 81yo who was referred to [place] for medication assistance due to concern regarding co-pay cost of Namenda.  I made outreach call to patient's daughter/primary caregiver/HCPOA Tiffany [name].  There was no answer.  I left a HIPAA compliant voicemail for [name] to return my call.  I will reach out within one week if she does not return my call.     [name], Pharm.D. Pharmacy Resident [place] [phone]

## 2015-12-12 ENCOUNTER — Other Ambulatory Visit: Payer: Self-pay | Admitting: Family

## 2015-12-13 ENCOUNTER — Other Ambulatory Visit: Payer: Self-pay | Admitting: Pharmacist

## 2015-12-13 NOTE — Patient Outreach (Signed)
[facility]) Care Management  [place]   12/13/2015  LAURELEE HOCKMAN 1929-03-24 [id number]  Subjective: Tiffany [name] is a 82yo who was referred to [place] for medication assistance due to concern regarding co-pay cost of Namenda. I made outreach call to patient's daughter/primary caregiver/HCPOA Danny Lawless.  Mrs. Tiffany [name] states her mother reports difficulty affording Namenda XR prescription (tier 3 under patient's insurance plan).  She states patient always pays copay but has expressed interest in stopping the medication due to medication cost. I reviewed patients medications with her daughter and completed a medication review.  Mrs. Tiffany [name] reports that patient fills her own pill box and that she checks behind her.    Objective:   Current Medications: Current Outpatient Prescriptions  Medication Sig Dispense Refill  . acetaminophen (TYLENOL) 500 MG tablet Take 500 mg by mouth every 6 (six) hours as needed for mild pain, moderate pain or headache.    . ferrous sulfate 325 (65 FE) MG tablet Take 325 mg by mouth daily with breakfast.    . fluticasone (FLONASE) 50 MCG/ACT nasal spray Place 2 sprays into both nostrils daily. 16 g 1  . furosemide (LASIX) 40 MG tablet Take 0.5 tablets (20 mg total) by mouth daily. 30 tablet 5  . metoprolol succinate (TOPROL-XL) 25 MG 24 hr tablet Take 0.5 tablets (12.5 mg total) by mouth daily. 15 tablet 6  . mirtazapine (REMERON) 15 MG tablet Take 1 tablet (15 mg total) by mouth at bedtime. 90 tablet 1  . NAMENDA XR 28 MG CP24 24 hr capsule TAKE 1 CAPSULE (28 MG TOTAL) BY MOUTH DAILY. 30 capsule 2  . pantoprazole (PROTONIX) 40 MG tablet Take 1 tablet (40 mg total) by mouth 2 (two) times daily. 180 tablet 1  . Potassium Chloride ER 20 MEQ TBCR TAKE 1 TABLET (20 MEQ TOTAL) BY MOUTH DAILY. 30 tablet 5  . Famotidine (PEPCID PO) Take 1 tablet by mouth once. Reported on 12/13/2015    . furosemide (LASIX) 40 MG tablet TAKE 1  TABLET (40 MG TOTAL) BY MOUTH DAILY. 30 tablet 4   No current facility-administered medications for this visit.    Functional Status: In your present state of health, do you have any difficulty performing the following activities: 11/23/2015 08/25/2015  Hearing? N Y  Vision? N N  Difficulty concentrating or making decisions? Y N  Walking or climbing stairs? N N  Dressing or bathing? N N  Doing errands, shopping? Y N  Preparing Food and eating ? Y -  Using the Toilet? N -  In the past six months, have you accidently leaked urine? N -  Do you have problems with loss of bowel control? N -  Managing your Medications? Y -  Managing your Finances? Y -  Housekeeping or managing your Housekeeping? Y -    Fall/Depression Screening: PHQ 2/9 Scores 11/23/2015 03/17/2015 01/20/2014  PHQ - 2 Score 0 0 0    Assessment: 1.  Medication assistance:  Per patient's daughter, patient reports difficulty affording Namenda copay.  I discussed options for medication assistance including Extra Help and Namenda patient assistance program.  Mrs. Tiffany [name] reports patient is married and together she and her husband are over the income limit for Extra Help.  Namenda patient assistance requires Extra Help denial letter in order for patient's with medicare part D to apply.  I explained this to Mrs. Tiffany [name].  Mrs. Tiffany [name] reports she will talk to her mother about completing applications for Extra  Help and Namenda patient assistance and requests a return phone call in one week.  2.  Medication review:    Drugs sorted by system:  Neurologic/Psychologic: memantine XR, mirtazapine  Cardiovascular: furosemide, metoprolol succinate, potassium  Pulmonary/Allergy: fluticasone nasal spray  Gastrointestinal: pantoprazole  Endocrine: none  Renal: none  Topical: none  Pain: acetaminophen  Vitamins/Minerals: ferrous sulfate  Infectious Diseases: none  Miscellaneous: none   Duplications in therapy: pantoprazole  and famotidine - patient's daughter reports she is no longer taking famotidine Gaps in therapy: none noted  Medications to avoid in the elderly: pantoprazole (risk of Clostridium difficile infection and bone loss and fractures) Drug interactions: none noted Other issues noted: none noted   Plan: 1.  Medication assistance:  Patient's daughter/primary caregiver/HCPOA, Danny Lawless, will discuss medication assistance options with patient including Extra Help application and patient assistance program for Namenda.   2.  Medication review:  All medications appear appropriate based on patient's problem list.  Patient is currently on pantoprazole which is on the beers list; however, due to history of GI bleed, I feel benefit outweighs the risk.   3.  I will make outreach call to patient's daughter in one week as requested.     [name], Pharm.D. Pharmacy Resident [place] [phone]

## 2015-12-20 ENCOUNTER — Other Ambulatory Visit: Payer: Self-pay | Admitting: Pharmacist

## 2015-12-20 NOTE — Patient Outreach (Signed)
[facility]) Care Management  [place]   12/20/2015  Tiffany [name] Jun 09, 1929 [id number]  Subjective: Tiffany [name] is a 82yo who was referred to [place] for medication assistance due to concern regarding co-pay cost for Namenda.  I spoke to patient's daughter/primary caregiver/HCPOA Tiffany [name] last week regarding options for medication assistance which include Extra Help or Namenda patient assistance program.  Tiffany [name] did not feel her mother met income limits for Extra Help and wanted to talk to her mother about the applications before proceeding.    I made outreach call today to Tiffany [name] to follow up with her regarding medication assistance for Namenda.  Tiffany [name] reports she talked with her mother last night and the patient does not wish to pursue Extra Help or patient assistance programs at this time.  Patient reports she is ok paying her copay now, and that she was just having a rough month around Christmas.     Tiffany [name] denies any further pharmacy needs for the patient at this time.    Objective:   Current Medications: Current Outpatient Prescriptions  Medication Sig Dispense Refill  . acetaminophen (TYLENOL) 500 MG tablet Take 500 mg by mouth every 6 (six) hours as needed for mild pain, moderate pain or headache.    . Famotidine (PEPCID PO) Take 1 tablet by mouth once. Reported on 12/13/2015    . ferrous sulfate 325 (65 FE) MG tablet Take 325 mg by mouth daily with breakfast.    . fluticasone (FLONASE) 50 MCG/ACT nasal spray Place 2 sprays into both nostrils daily. 16 g 1  . furosemide (LASIX) 40 MG tablet Take 0.5 tablets (20 mg total) by mouth daily. 30 tablet 5  . furosemide (LASIX) 40 MG tablet TAKE 1 TABLET (40 MG TOTAL) BY MOUTH DAILY. 30 tablet 4  . metoprolol succinate (TOPROL-XL) 25 MG 24 hr tablet Take 0.5 tablets (12.5 mg total) by mouth daily. 15 tablet 6  . mirtazapine (REMERON) 15 MG tablet Take 1 tablet (15 mg  total) by mouth at bedtime. 90 tablet 1  . NAMENDA XR 28 MG CP24 24 hr capsule TAKE 1 CAPSULE (28 MG TOTAL) BY MOUTH DAILY. 30 capsule 2  . pantoprazole (PROTONIX) 40 MG tablet Take 1 tablet (40 mg total) by mouth 2 (two) times daily. 180 tablet 1  . Potassium Chloride ER 20 MEQ TBCR TAKE 1 TABLET (20 MEQ TOTAL) BY MOUTH DAILY. 30 tablet 5   No current facility-administered medications for this visit.    Functional Status: In your present state of health, do you have any difficulty performing the following activities: 11/23/2015 08/25/2015  Hearing? N Y  Vision? N N  Difficulty concentrating or making decisions? Y N  Walking or climbing stairs? N N  Dressing or bathing? N N  Doing errands, shopping? Y N  Preparing Food and eating ? Y -  Using the Toilet? N -  In the past six months, have you accidently leaked urine? N -  Do you have problems with loss of bowel control? N -  Managing your Medications? Y -  Managing your Finances? Y -  Housekeeping or managing your Housekeeping? Y -    Fall/Depression Screening: PHQ 2/9 Scores 11/23/2015 03/17/2015 01/20/2014  PHQ - 2 Score 0 0 0    Assessment: 1.  Medication assistance:  Patient had previously reported difficulty affording Namenda copay.  Patient's daughter discussed options for medication assistance with patient including Extra Help and Namenda patient  assistance program.  Patient did not wish to pursue either of these options at this time and reports she is ok paying her copay now.    Plan: 1.  Will close pharmacy program as patient has no further pharmacy needs at this time.  Provided patient's daughter/primary caregiver/HCPOA Tiffany [name] with my phone number and encouraged her to call me if there are any concerns or questions regarding her mother's medications in the future.  Will alert [facility] CMRN Crystal Hutchinson of pharmacy closure.   [name], Pharm.D. Pharmacy Resident [place] [phone]

## 2015-12-21 ENCOUNTER — Other Ambulatory Visit: Payer: Self-pay

## 2015-12-21 NOTE — Patient Outreach (Signed)
[facility]) Care Management  12/21/2015  ALISSANDRA BRUNETTI 1929/09/05 [id number]   Telephonic Monthly Assessment  Outreach call #1 to patient at [phone] -  Daughter reached and states patient has returned back to her home and can be reached at [phone]. RN CM attempted outreach to this # but contact states wrong #.  RN CM will schedule for next outreach call within one week.  Mariann Laster, RN, BSN, [facility], CCM  [employer] Management Coordinator [phone] Direct [phone] Cell [phone] Office [phone] Fax

## 2015-12-23 ENCOUNTER — Other Ambulatory Visit: Payer: Self-pay

## 2015-12-23 DIAGNOSIS — I5022 Chronic systolic (congestive) heart failure: Secondary | ICD-10-CM

## 2015-12-23 NOTE — Patient Outreach (Addendum)
[facility]) Care Management  12/23/2015  Tiffany [name] 1929-01-05 [id number]  Telephonic Monthly Assessment  Outreach call #2 to patient at [phone] - (h/o patient has returned back to her home).  Patient reached and assessment completed.   Referral Date: 11/15/2015 Referral Source: [facility] tier 4 Referral Issue: CHF with 4 ED visits and 3 admissions  Providers: Primary MD: Dr. Justin Mend - last appt 10/05/15 - next appt end of Jan 2017 with MD covering for Dr. Justin Mend.  States Dr. Megan Salon is down to one day a week and seeing the NP at this time. Patient and husband desire to change Primary MD's and requesting to see Dr.Bruce WElease Hashimoto, [employer] at [facility], [address] [place], [place], [place] [address] [phone]. States last attempt unsuccessful and MD not accepting any new patients. HH: none  Insurance: HTA starting Jan 2017.   Social:  Patient has returned back to her home [phone] and lives with her husband.  Daughter provides care giving to both patient and her husband. Patient has dementia.   Mobility: Walks with cane or walker as needed only. Active with [employer].  Falls: 1 Patient fell walking from [facility] to parking lot but no injuries.  Pain: none Transportation: Daughter Caregiver: Daughter/HCPOA/Nikki Nicole Kindred [phone] home  Resources: private house cleaner DME: Kasandra Knudsen, walker: Platform, with wheels and Rolator, W/C, scales   THN conditions: CHF, HTN Admissions: 2 - States reason for admissions associated to GI bleed.  ER visits: 0 Patient states she is not aware she has CHF.  Patient only weighing about every 3-4 days.  States last weight 133.  Per [facility], last weight 143 lbs on 11/23/2015.  Patient has h/o dementia per daughters report.   Medications:  Less than 10 medications Flu Vaccine: 10/05/15 PPSV23: 04/15/2014 [place] referral for option of Namenda Medication assistance program completed (see  Epic note 12/20/15 for further details).   Consent: Patient and patient's daughter agreed to [facility] services.   Plan:  Screening and Initial Assessment completed on 11/23/2015 Referred to Telephonic Care Management services 11/23/2015 - 12/23/2015 Program: HF 11/23/2015 - 12/23/2015  CHF [place] Education mailed on 11/23/15.  Patient unable to review this call; states she may have just flipped over but did not read.  RN will continue to follow-up on future calls.  ED / Hospital Utilization: -Know Before You Go Fall Prevention:  -Fall Prevention CHF: -Heart Failure: Keeping Track of Your Weight Each Day -Heart Failure: How To Be Salt Smart -Heart Failure: When To Call Your Doctor Or 911 -Juanell Fairly, Red Heart Zone Magnet  [place] Referral sent 12/23/2015 to provide disease management coaching.  RN CM advised in next contact call within 30 days.  RN CM advised to please notify MD of any changes in condition prior to scheduled appt's.  RN CM provided contact name and # [phone] or main office # [phone] and 24-hour nurse line # [phone].  RN CM confirmed patient is aware of 911 services for urgent emergency needs.  Mariann Laster, RN, BSN, [facility], CCM  [employer] Management Coordinator [phone] Direct [phone] Cell [phone] Office [phone] Fax

## 2015-12-27 ENCOUNTER — Other Ambulatory Visit: Payer: Self-pay

## 2015-12-27 NOTE — Patient Outreach (Signed)
[facility]) Care Management  12/27/2015  Tiffany [name] 08/22/1929 [id number]   Telephone call to patient for initial health coach assessment.  Patient states she is busy at the present time and asked for a call another time.  Advised patient that health coach would attempt her another time.    Plan: RN Health Coach will contact patient within 1-2 weeks.  Jone Baseman, RN, MSN [place] [phone]

## 2016-01-02 ENCOUNTER — Other Ambulatory Visit: Payer: Self-pay

## 2016-01-02 NOTE — Patient Outreach (Signed)
[facility]) Care Management  Tiffany [name]  01/02/2016   Tiffany [name] 09-Dec-1929 [id number]  Subjective: Telephone call to patient for initial health coach assessment.  Patient reports she is doing good.  She reports she has a doctors appointment next week.  Patient states she does drive herself to close appointments but her daughter does help when needed.  Patient lives with her spouse.  Patient reports she does have some forgetfulness but is able to manage her medications and things around the house.    Heart Failure: Patient reports she does not weigh daily but about every other day.  She denies swelling or shortness of breath.  Discussed with patient heart failure zone chart and importance of using it as a guide. She verbalized understanding.   Objective:   Current Medications:  Current Outpatient Prescriptions  Medication Sig Dispense Refill  . acetaminophen (TYLENOL) 500 MG tablet Take 500 mg by mouth every 6 (six) hours as needed for mild pain, moderate pain or headache.    . ferrous sulfate 325 (65 FE) MG tablet Take 325 mg by mouth daily with breakfast.    . fluticasone (FLONASE) 50 MCG/ACT nasal spray Place 2 sprays into both nostrils daily. 16 g 1  . furosemide (LASIX) 40 MG tablet Take 0.5 tablets (20 mg total) by mouth daily. 30 tablet 5  . metoprolol succinate (TOPROL-XL) 25 MG 24 hr tablet Take 0.5 tablets (12.5 mg total) by mouth daily. 15 tablet 6  . mirtazapine (REMERON) 15 MG tablet Take 1 tablet (15 mg total) by mouth at bedtime. 90 tablet 1  . NAMENDA XR 28 MG CP24 24 hr capsule TAKE 1 CAPSULE (28 MG TOTAL) BY MOUTH DAILY. 30 capsule 2  . pantoprazole (PROTONIX) 40 MG tablet Take 1 tablet (40 mg total) by mouth 2 (two) times daily. 180 tablet 1  . Potassium Chloride ER 20 MEQ TBCR TAKE 1 TABLET (20 MEQ TOTAL) BY MOUTH DAILY. 30 tablet 5  . Famotidine (PEPCID PO) Take 1 tablet by mouth once. Reported on 01/02/2016    . furosemide (LASIX) 40 MG  tablet TAKE 1 TABLET (40 MG TOTAL) BY MOUTH DAILY. (Patient not taking: Reported on 01/02/2016) 30 tablet 4   No current facility-administered medications for this visit.    Functional Status:  In your present state of health, do you have any difficulty performing the following activities: 01/02/2016 11/23/2015  Hearing? N N  Vision? N N  Difficulty concentrating or making decisions? N Y  Walking or climbing stairs? N N  Dressing or bathing? N N  Doing errands, shopping? Tempie Donning  Preparing Food and eating ? N Y  Using the Toilet? N N  In the past six months, have you accidently leaked urine? N N  Do you have problems with loss of bowel control? N N  Managing your Medications? N Y  Managing your Finances? N Y  Housekeeping or managing your Housekeeping? Tempie Donning    Fall/Depression Screening: PHQ 2/9 Scores 01/02/2016 11/23/2015 03/17/2015 01/20/2014  PHQ - 2 Score 0 0 0 0    Assessment: Patient will benefit from health coach outreach for disease management for self-management.  Plan:  [facility] CM Care Plan Problem One        Most Recent Value   Care Plan Problem One  CHF   Role Documenting the Problem One  Health [place] for Problem One  Active   THN Long Term Goal (31-90 days)  Patient  will have no admissions for CHF over the next 90 days.    THN Long Term Goal Start Date  01/02/16   THN Long Term Goal Met Date  -- [Goal continued]   Interventions for Problem One Long Term Goal  RN Health Coach discussed with patient heart failure zone chart.   THN CM Short Term Goal #1 (0-30 days)  Patient/ family will engage in Heart Failure Zone Education over the next 30 days.    THN CM Short Term Goal #1 Start Date  01/02/16   THN CM Short Term Goal #1 Met Date  -- [Goal continued]   Interventions for Short Term Goal #1  RN Health Coach reviewed with patient heart failure yellow zone.     THN CM Short Term Goal #2 (0-30 days)  Patient will be able to name 3 high sodium food items within 30 days.    THN CM Short Term Goal #2 Start Date  01/02/16   Interventions for Short Term Goal #2  [place] reviewed with patient high sodium foods.       RN Health Coach will provide ongoing education for patient on heart failure through phone calls and sending printed information to patient for further discussion.  RN Health Coach will send welcome packet with consent to patient as well as printed information on health failure.  RN Health Coach will send initial barriers letter, assessment, and care plan to primary care physician.  RN Health Coach will contact patient within one month and patient agrees to next contact.   Jone Baseman, RN, MSN [place] [phone]

## 2016-01-03 ENCOUNTER — Other Ambulatory Visit: Payer: Self-pay | Admitting: Neurology

## 2016-01-03 NOTE — Telephone Encounter (Signed)
Last OV: 11/23/15 Next OV: 08/20/16

## 2016-01-10 ENCOUNTER — Encounter: Payer: Self-pay | Admitting: Adult Health

## 2016-01-10 ENCOUNTER — Ambulatory Visit (INDEPENDENT_AMBULATORY_CARE_PROVIDER_SITE_OTHER): Payer: PPO | Admitting: Adult Health

## 2016-01-10 VITALS — BP 128/88 | Temp 98.1°F | Ht 61.0 in | Wt 140.2 lb

## 2016-01-10 DIAGNOSIS — F0280 Dementia in other diseases classified elsewhere without behavioral disturbance: Secondary | ICD-10-CM | POA: Diagnosis not present

## 2016-01-10 DIAGNOSIS — I10 Essential (primary) hypertension: Secondary | ICD-10-CM

## 2016-01-10 DIAGNOSIS — G309 Alzheimer's disease, unspecified: Secondary | ICD-10-CM | POA: Diagnosis not present

## 2016-01-10 DIAGNOSIS — Z7189 Other specified counseling: Secondary | ICD-10-CM

## 2016-01-10 DIAGNOSIS — Z7689 Persons encountering health services in other specified circumstances: Secondary | ICD-10-CM

## 2016-01-10 NOTE — Progress Notes (Signed)
HPI:  Tiffany [name] is here to establish care. She is a very pleasant and active 82 year old female who  has a past medical history of Hypertension; Arthritis; Dementia; Allergic rhinitis; GI bleed; Raynaud's disease; GERD (gastroesophageal reflux disease); Acute respiratory failure ([place]); Pleural effusion; ILD (interstitial lung disease) ([place]); Pulmonary edema; Chronic diastolic (congestive) heart failure ([place]); Chronic systolic heart failure ([place]); and A-fib ([place]). She presents to this appointment with her son in law and her 81 year old husband.   She is currently taking Namenda XR 28 mg daily and reports she is able to tolerate this medication well. She continues to set up medications herself. She drives to the store and has not had any issue with getting lost. She continues to go to the [facility] two times per week.   She is followed by Dr. Metta Clines for Alzheimer's Dementia and last saw him on 11/23/2015.   Last PCP and physical: 01/11/2014 with NP Justin Mend  Has the following chronic problems that require follow up and concerns today:  Hypertension - Her BP was elevated at her last visit with neurology. She is currently taking Toprol -XL 245 mg. Today in the officer her BP is controlled 128/88.   BP Readings from Last 3 Encounters:  01/10/16 128/88  11/23/15 148/86  10/05/15 122/80   Alzheimer's Dementia - She appears well controlled on Namenda XR 28 mg. She does not think that she has taken Aricept in the past.    ROS negative for unless reported above: fevers, chills,feeling poorly, unintentional weight loss, hearing or vision loss, chest pain, palpitations, leg claudication, struggling to breath,Not feeling congested in the chest, no orthopenia, no cough,no wheezing, normal appetite, no soft tissue swelling, no hemoptysis, melena, hematochezia, hematuria, falls, loc, si, or thoughts of self harm.  Immunizations: UTD Diet: Does not follow a specific diet Exercise: Goes to the [facility]  2 days per week.  Colonoscopy: 2016 due to GI bleed Dexa: Unsure, possibly yes   Past Medical History  Diagnosis Date  . Hypertension   . Arthritis   . Dementia   . Allergic rhinitis   . GI bleed   . Raynaud's disease   . GERD (gastroesophageal reflux disease)   . Acute respiratory failure ([place])   . Pleural effusion   . ILD (interstitial lung disease) ([place])   . Pulmonary edema   . Chronic diastolic (congestive) heart failure ([place])   . Chronic systolic heart failure ([place])   . A-fib [facility])     Past Surgical History  Procedure Laterality Date  . Back surgery  09/25/2011  . Abdominal hysterectomy    . Esophagogastroduodenoscopy N/A 03/01/2015    Procedure: ESOPHAGOGASTRODUODENOSCOPY (EGD);  Surgeon: Arta Silence, MD;  Location: [facility];  Service: Endoscopy;  Laterality: N/A;  . Esophagogastroduodenoscopy N/A 03/02/2015    Procedure: ESOPHAGOGASTRODUODENOSCOPY (EGD);  Surgeon: Arta Silence, MD;  Location: [facility];  Service: Endoscopy;  Laterality: N/A;  bedside  . Esophagogastroduodenoscopy (egd) with propofol N/A 06/25/2015    Procedure: ESOPHAGOGASTRODUODENOSCOPY (EGD) WITH PROPOFOL;  Surgeon: Clarene Essex, MD;  Location: [facility] ENDOSCOPY;  Service: Endoscopy;  Laterality: N/A;  . Colonoscopy with propofol N/A 06/27/2015    Procedure: COLONOSCOPY WITH PROPOFOL;  Surgeon: Arta Silence, MD;  Location: [facility] ENDOSCOPY;  Service: Endoscopy;  Laterality: N/A;  . Givens capsule study N/A 06/27/2015    Procedure: GIVENS CAPSULE STUDY;  Surgeon: Arta Silence, MD;  Location: [facility] ENDOSCOPY;  Service: Endoscopy;  Laterality: N/A;    Family History  Problem Relation Age of Onset  . Heart attack Brother   . Ovarian cancer Mother   . Colon cancer Sister   . Osteoarthritis Mother     Social History   Social History  . Marital Status: Married    Spouse Name: N/A  . Number of Children: 1  . Years of Education: N/A   Occupational History  . Retired    Social History Main Topics   . Smoking status: Never Smoker   . Smokeless tobacco: Never Used  . Alcohol Use: No  . Drug Use: No  . Sexual Activity: No   Other Topics Concern  . 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    She likes to be outside. Golf   She goes to the [facility]         Current outpatient prescriptions:  .  acetaminophen (TYLENOL) 500 MG tablet, Take 500 mg by mouth every 6 (six) hours as needed for mild pain, moderate pain or headache., Disp: , Rfl:  .  Famotidine (PEPCID PO), Take 1 tablet by mouth once. Reported on 01/02/2016, Disp: , Rfl:  .  ferrous sulfate 325 (65 FE) MG tablet, Take 325 mg by mouth daily with breakfast., Disp: , Rfl:  .  fluticasone (FLONASE) 50 MCG/ACT nasal spray, Place 2 sprays into both nostrils daily., Disp: 16 g, Rfl: 1 .  furosemide (LASIX) 40 MG tablet, Take 0.5 tablets (20 mg total) by mouth daily., Disp: 30 tablet, Rfl: 5 .  furosemide (LASIX) 40 MG tablet, TAKE 1 TABLET (40 MG TOTAL) BY MOUTH DAILY. (Patient not taking: Reported on 01/02/2016), Disp: 30 tablet, Rfl: 4 .  metoprolol succinate (TOPROL-XL) 25 MG 24 hr tablet, Take 0.5 tablets (12.5 mg total) by mouth daily., Disp: 15 tablet, Rfl: 6 .  mirtazapine (REMERON) 15 MG tablet, Take 1 tablet (15 mg total) by mouth at bedtime., Disp: 90 tablet, Rfl: 1 .  NAMENDA XR 28 MG CP24 24 hr capsule, TAKE 1 CAPSULE (28 MG TOTAL) BY MOUTH DAILY., Disp: 30 capsule, Rfl: 2 .  NAMENDA XR 28 MG CP24 24 hr capsule, TAKE 1 CAPSULE (28 MG TOTAL) BY MOUTH DAILY., Disp: 30 capsule, Rfl: 8 .  pantoprazole (PROTONIX) 40 MG tablet, Take 1 tablet (40 mg total) by mouth 2 (two) times daily., Disp: 180 tablet, Rfl: 1 .  Potassium Chloride ER 20 MEQ TBCR, TAKE 1 TABLET (20 MEQ TOTAL) BY MOUTH DAILY., Disp: 30 tablet, Rfl: 5  EXAM:  Filed Vitals:   01/10/16 1307  BP: 128/88  Temp: 98.1 F (36.7 C)    Body mass index is 26.5 kg/(m^2).  GENERAL: vitals reviewed and listed above, alert,  oriented, appears well hydrated and in no acute distress  HEENT: atraumatic, conjunttiva clear, no obvious abnormalities on inspection of external nose and ears  NECK: Neck is soft and supple without masses, no adenopathy or thyromegaly, trachea midline, no JVD. Normal range of motion.   LUNGS: clear to auscultation bilaterally, no wheezes, rales or rhonchi, good air movement  CV: Regular rate and rhythm, normal S1/S2, no audible murmurs, gallops, or rubs. No carotid bruit and no peripheral edema.   MS: moves all extremities without noticeable abnormality. No edema noted  Abd: soft/nontender/nondistended/normal bowel sounds   Skin: warm and dry, no rash   Extremities: No clubbing, cyanosis, or edema. Capillary refill is WNL. Pulses intact bilaterally in upper and lower extremities.   Neuro:  CN II-XII intact, sensation and reflexes normal throughout, 5/5 muscle strength in bilateral upper and lower extremities. There were a few instances during exam where she would repeat herself after a few minutes  PSYCH: pleasant and cooperative, no obvious depression or anxiety  ASSESSMENT AND PLAN:  1. Encounter to establish care - Follow up for CPE - Follow up sooner if needed - Continue to exercise and stay active  2. Essential hypertension - Controlled on current medication therapy   3. Alzheimer's dementia - Continue with Namenda XR 28 mg per Neurology recommendations - Follow up with Dr. Tomi Likens as scheduled.  - Limit driving    -We reviewed the PMH, PSH, FH, SH, Meds and Allergies. -We provided refills for any medications we will prescribe as needed. -We addressed current concerns per orders and patient instructions. -We have asked for records for pertinent exams, studies, vaccines and notes from previous providers. -We have advised patient to follow up per instructions below.   -Patient advised to return or notify a provider immediately if symptoms worsen or persist or new  concerns arise.     Dorothyann Peng, AGNP

## 2016-01-10 NOTE — Patient Instructions (Signed)
It was a pleasure meeting you today!  I will follow up with you about your physical. If you need anything in the meantime, please let me know.   Keep Mr. Andre out of trouble the best that you can.

## 2016-01-11 ENCOUNTER — Encounter: Payer: Self-pay | Admitting: Adult Health

## 2016-01-18 ENCOUNTER — Encounter: Payer: Self-pay | Admitting: Neurology

## 2016-01-27 ENCOUNTER — Inpatient Hospital Stay (HOSPITAL_COMMUNITY): Payer: PPO

## 2016-01-27 ENCOUNTER — Encounter (HOSPITAL_COMMUNITY): Payer: Self-pay | Admitting: Emergency Medicine

## 2016-01-27 ENCOUNTER — Inpatient Hospital Stay (HOSPITAL_COMMUNITY)
Admission: EM | Admit: 2016-01-27 | Discharge: 2016-02-02 | DRG: 368 | Disposition: A | Discharge status: Home Health | Payer: PPO | Attending: Internal Medicine | Admitting: Internal Medicine

## 2016-01-27 ENCOUNTER — Emergency Department (HOSPITAL_COMMUNITY): Payer: PPO | Admitting: Anesthesiology

## 2016-01-27 ENCOUNTER — Encounter (HOSPITAL_COMMUNITY)
Admission: EM | Disposition: A | Discharge status: Home Health | Payer: Self-pay | Source: Home / Self Care | Attending: Pulmonary Disease

## 2016-01-27 ENCOUNTER — Other Ambulatory Visit: Payer: Self-pay

## 2016-01-27 DIAGNOSIS — Z800 Family history of malignant neoplasm of digestive organs: Secondary | ICD-10-CM | POA: Diagnosis not present

## 2016-01-27 DIAGNOSIS — K7460 Unspecified cirrhosis of liver: Secondary | ICD-10-CM

## 2016-01-27 DIAGNOSIS — I5022 Chronic systolic (congestive) heart failure: Secondary | ICD-10-CM | POA: Diagnosis present

## 2016-01-27 DIAGNOSIS — Z79899 Other long term (current) drug therapy: Secondary | ICD-10-CM

## 2016-01-27 DIAGNOSIS — I10 Essential (primary) hypertension: Secondary | ICD-10-CM | POA: Diagnosis present

## 2016-01-27 DIAGNOSIS — Z8041 Family history of malignant neoplasm of ovary: Secondary | ICD-10-CM | POA: Diagnosis not present

## 2016-01-27 DIAGNOSIS — M1990 Unspecified osteoarthritis, unspecified site: Secondary | ICD-10-CM | POA: Diagnosis present

## 2016-01-27 DIAGNOSIS — D62 Acute posthemorrhagic anemia: Secondary | ICD-10-CM | POA: Diagnosis present

## 2016-01-27 DIAGNOSIS — K921 Melena: Secondary | ICD-10-CM | POA: Diagnosis present

## 2016-01-27 DIAGNOSIS — D696 Thrombocytopenia, unspecified: Secondary | ICD-10-CM | POA: Diagnosis present

## 2016-01-27 DIAGNOSIS — J80 Acute respiratory distress syndrome: Secondary | ICD-10-CM | POA: Diagnosis not present

## 2016-01-27 DIAGNOSIS — I8511 Secondary esophageal varices with bleeding: Secondary | ICD-10-CM | POA: Diagnosis not present

## 2016-01-27 DIAGNOSIS — G309 Alzheimer's disease, unspecified: Secondary | ICD-10-CM | POA: Diagnosis present

## 2016-01-27 DIAGNOSIS — Z8249 Family history of ischemic heart disease and other diseases of the circulatory system: Secondary | ICD-10-CM

## 2016-01-27 DIAGNOSIS — R55 Syncope and collapse: Secondary | ICD-10-CM | POA: Diagnosis present

## 2016-01-27 DIAGNOSIS — J9600 Acute respiratory failure, unspecified whether with hypoxia or hypercapnia: Secondary | ICD-10-CM

## 2016-01-27 DIAGNOSIS — I5032 Chronic diastolic (congestive) heart failure: Secondary | ICD-10-CM | POA: Diagnosis present

## 2016-01-27 DIAGNOSIS — J9811 Atelectasis: Secondary | ICD-10-CM | POA: Diagnosis present

## 2016-01-27 DIAGNOSIS — B182 Chronic viral hepatitis C: Secondary | ICD-10-CM | POA: Diagnosis present

## 2016-01-27 DIAGNOSIS — K920 Hematemesis: Secondary | ICD-10-CM | POA: Diagnosis present

## 2016-01-27 DIAGNOSIS — K7469 Other cirrhosis of liver: Secondary | ICD-10-CM | POA: Diagnosis not present

## 2016-01-27 DIAGNOSIS — J90 Pleural effusion, not elsewhere classified: Secondary | ICD-10-CM | POA: Diagnosis not present

## 2016-01-27 DIAGNOSIS — E876 Hypokalemia: Secondary | ICD-10-CM | POA: Diagnosis present

## 2016-01-27 DIAGNOSIS — Z66 Do not resuscitate: Secondary | ICD-10-CM | POA: Diagnosis present

## 2016-01-27 DIAGNOSIS — K219 Gastro-esophageal reflux disease without esophagitis: Secondary | ICD-10-CM | POA: Diagnosis present

## 2016-01-27 DIAGNOSIS — I8501 Esophageal varices with bleeding: Principal | ICD-10-CM | POA: Diagnosis present

## 2016-01-27 DIAGNOSIS — K922 Gastrointestinal hemorrhage, unspecified: Secondary | ICD-10-CM

## 2016-01-27 DIAGNOSIS — I429 Cardiomyopathy, unspecified: Secondary | ICD-10-CM | POA: Diagnosis present

## 2016-01-27 DIAGNOSIS — T501X5A Adverse effect of loop [high-ceiling] diuretics, initial encounter: Secondary | ICD-10-CM | POA: Diagnosis present

## 2016-01-27 DIAGNOSIS — K766 Portal hypertension: Secondary | ICD-10-CM | POA: Diagnosis present

## 2016-01-27 DIAGNOSIS — J302 Other seasonal allergic rhinitis: Secondary | ICD-10-CM | POA: Diagnosis present

## 2016-01-27 DIAGNOSIS — I8510 Secondary esophageal varices without bleeding: Secondary | ICD-10-CM

## 2016-01-27 DIAGNOSIS — Z95828 Presence of other vascular implants and grafts: Secondary | ICD-10-CM

## 2016-01-27 DIAGNOSIS — F0280 Dementia in other diseases classified elsewhere without behavioral disturbance: Secondary | ICD-10-CM | POA: Diagnosis present

## 2016-01-27 DIAGNOSIS — I5042 Chronic combined systolic (congestive) and diastolic (congestive) heart failure: Secondary | ICD-10-CM | POA: Diagnosis present

## 2016-01-27 DIAGNOSIS — J9601 Acute respiratory failure with hypoxia: Secondary | ICD-10-CM | POA: Diagnosis not present

## 2016-01-27 DIAGNOSIS — I480 Paroxysmal atrial fibrillation: Secondary | ICD-10-CM | POA: Diagnosis present

## 2016-01-27 DIAGNOSIS — I7300 Raynaud's syndrome without gangrene: Secondary | ICD-10-CM | POA: Diagnosis present

## 2016-01-27 DIAGNOSIS — E870 Hyperosmolality and hypernatremia: Secondary | ICD-10-CM | POA: Diagnosis present

## 2016-01-27 DIAGNOSIS — J811 Chronic pulmonary edema: Secondary | ICD-10-CM | POA: Diagnosis present

## 2016-01-27 DIAGNOSIS — Z789 Other specified health status: Secondary | ICD-10-CM

## 2016-01-27 DIAGNOSIS — J849 Interstitial pulmonary disease, unspecified: Secondary | ICD-10-CM | POA: Diagnosis present

## 2016-01-27 DIAGNOSIS — Z978 Presence of other specified devices: Secondary | ICD-10-CM

## 2016-01-27 DIAGNOSIS — I5020 Unspecified systolic (congestive) heart failure: Secondary | ICD-10-CM

## 2016-01-27 DIAGNOSIS — Z515 Encounter for palliative care: Secondary | ICD-10-CM | POA: Diagnosis not present

## 2016-01-27 DIAGNOSIS — J690 Pneumonitis due to inhalation of food and vomit: Secondary | ICD-10-CM | POA: Diagnosis not present

## 2016-01-27 DIAGNOSIS — Z9889 Other specified postprocedural states: Secondary | ICD-10-CM

## 2016-01-27 HISTORY — PX: ESOPHAGOGASTRODUODENOSCOPY: SHX5428

## 2016-01-27 LAB — URINALYSIS, ROUTINE W REFLEX MICROSCOPIC
Bilirubin Urine: NEGATIVE
Glucose, UA: NEGATIVE mg/dL
HGB URINE DIPSTICK: NEGATIVE
Ketones, ur: NEGATIVE mg/dL
Leukocytes, UA: NEGATIVE
Nitrite: NEGATIVE
PH: 6 (ref 5.0–8.0)
Protein, ur: NEGATIVE mg/dL
SPECIFIC GRAVITY, URINE: 1.026 (ref 1.005–1.030)

## 2016-01-27 LAB — CBC WITH DIFFERENTIAL/PLATELET
Basophils Absolute: 0 10*3/uL (ref 0.0–0.1)
Basophils Relative: 0 %
Eosinophils Absolute: 0.1 10*3/uL (ref 0.0–0.7)
Eosinophils Relative: 1 %
HCT: 34.5 % — ABNORMAL LOW (ref 36.0–46.0)
HEMOGLOBIN: 10.7 g/dL — AB (ref 12.0–15.0)
LYMPHS ABS: 0.9 10*3/uL (ref 0.7–4.0)
Lymphocytes Relative: 9 %
MCH: 29 pg (ref 26.0–34.0)
MCHC: 31 g/dL (ref 30.0–36.0)
MCV: 93.5 fL (ref 78.0–100.0)
MONO ABS: 0.3 10*3/uL (ref 0.1–1.0)
MONOS PCT: 3 %
NEUTROS ABS: 9.3 10*3/uL — AB (ref 1.7–7.7)
NEUTROS PCT: 88 %
Platelets: 111 10*3/uL — ABNORMAL LOW (ref 150–400)
RBC: 3.69 MIL/uL — ABNORMAL LOW (ref 3.87–5.11)
RDW: 14.1 % (ref 11.5–15.5)
WBC: 10.6 10*3/uL — ABNORMAL HIGH (ref 4.0–10.5)

## 2016-01-27 LAB — COMPREHENSIVE METABOLIC PANEL
ALK PHOS: 103 U/L (ref 38–126)
ALT: 14 U/L (ref 14–54)
AST: 23 U/L (ref 15–41)
Albumin: 3.7 g/dL (ref 3.5–5.0)
Anion gap: 11 (ref 5–15)
BUN: 40 mg/dL — AB (ref 6–20)
CALCIUM: 8.7 mg/dL — AB (ref 8.9–10.3)
CHLORIDE: 109 mmol/L (ref 101–111)
CO2: 25 mmol/L (ref 22–32)
CREATININE: 1.18 mg/dL — AB (ref 0.44–1.00)
GFR calc Af Amer: 48 mL/min — ABNORMAL LOW (ref >60)
GFR, EST NON AFRICAN AMERICAN: 41 mL/min — AB (ref >60)
Glucose, Bld: 151 mg/dL — ABNORMAL HIGH (ref 65–99)
Potassium: 4.4 mmol/L (ref 3.5–5.1)
SODIUM: 145 mmol/L (ref 135–145)
Total Bilirubin: 0.8 mg/dL (ref 0.3–1.2)
Total Protein: 6 g/dL — ABNORMAL LOW (ref 6.5–8.1)

## 2016-01-27 LAB — TROPONIN I
TROPONIN I: 0.09 ng/mL — AB (ref <0.031)
Troponin I: 0.03 ng/mL (ref <0.031)
Troponin I: 0.16 ng/mL — ABNORMAL HIGH (ref <0.031)

## 2016-01-27 LAB — PROTIME-INR
INR: 1.25 (ref 0.00–1.49)
Prothrombin Time: 15.8 seconds — ABNORMAL HIGH (ref 11.6–15.2)

## 2016-01-27 LAB — BLOOD GAS, ARTERIAL
Acid-base deficit: 2.9 mmol/L — ABNORMAL HIGH (ref 0.0–2.0)
Bicarbonate: 19.9 mEq/L — ABNORMAL LOW (ref 20.0–24.0)
FIO2: 1
MECHVT: 500 mL
O2 Saturation: 99.8 %
PEEP: 5 cmH2O
PH ART: 7.432 (ref 7.350–7.450)
Patient temperature: 98.6
RATE: 18 resp/min
TCO2: 17.7 mmol/L (ref 0–100)
pCO2 arterial: 30.4 mmHg — ABNORMAL LOW (ref 35.0–45.0)
pO2, Arterial: 340 mmHg — ABNORMAL HIGH (ref 80.0–100.0)

## 2016-01-27 LAB — CBC
HCT: 39.1 % (ref 36.0–46.0)
HEMOGLOBIN: 12.9 g/dL (ref 12.0–15.0)
MCH: 30.9 pg (ref 26.0–34.0)
MCHC: 33 g/dL (ref 30.0–36.0)
MCV: 93.5 fL (ref 78.0–100.0)
Platelets: 84 10*3/uL — ABNORMAL LOW (ref 150–400)
RBC: 4.18 MIL/uL (ref 3.87–5.11)
RDW: 15.5 % (ref 11.5–15.5)
WBC: 13.5 10*3/uL — ABNORMAL HIGH (ref 4.0–10.5)

## 2016-01-27 LAB — TRIGLYCERIDES: TRIGLYCERIDES: 226 mg/dL — AB (ref <150)

## 2016-01-27 LAB — PREPARE RBC (CROSSMATCH)

## 2016-01-27 LAB — POC OCCULT BLOOD, ED: FECAL OCCULT BLD: NEGATIVE

## 2016-01-27 LAB — PROCALCITONIN

## 2016-01-27 LAB — MRSA PCR SCREENING: MRSA by PCR: POSITIVE — AB

## 2016-01-27 LAB — BRAIN NATRIURETIC PEPTIDE: B Natriuretic Peptide: 75.5 pg/mL (ref 0.0–100.0)

## 2016-01-27 LAB — LIPASE, BLOOD: Lipase: 31 U/L (ref 11–51)

## 2016-01-27 SURGERY — EGD (ESOPHAGOGASTRODUODENOSCOPY)
Anesthesia: Monitor Anesthesia Care

## 2016-01-27 MED ORDER — SODIUM CHLORIDE 0.9 % IV SOLN
8.0000 mg/h | INTRAVENOUS | Status: DC
  Administered 2016-01-27 – 2016-01-29 (×6): 8 mg/h via INTRAVENOUS
  Filled 2016-01-27 (×10): qty 80

## 2016-01-27 MED ORDER — SODIUM CHLORIDE 0.9 % IV SOLN
50.0000 ug/h | INTRAVENOUS | Status: DC
  Administered 2016-01-27 – 2016-01-30 (×7): 50 ug/h via INTRAVENOUS
  Filled 2016-01-27 (×15): qty 1

## 2016-01-27 MED ORDER — MUPIROCIN 2 % EX OINT
1.0000 "application " | TOPICAL_OINTMENT | Freq: Two times a day (BID) | CUTANEOUS | Status: AC
  Administered 2016-01-27 – 2016-02-01 (×10): 1 via NASAL
  Filled 2016-01-27 (×3): qty 22

## 2016-01-27 MED ORDER — ACETAMINOPHEN 325 MG PO TABS
650.0000 mg | ORAL_TABLET | ORAL | Status: DC | PRN
  Administered 2016-01-28: 650 mg via ORAL
  Filled 2016-01-27: qty 2

## 2016-01-27 MED ORDER — LIDOCAINE HCL (CARDIAC) 20 MG/ML IV SOLN
INTRAVENOUS | Status: AC
  Filled 2016-01-27: qty 5

## 2016-01-27 MED ORDER — LIDOCAINE HCL (CARDIAC) 20 MG/ML IV SOLN
INTRAVENOUS | Status: DC | PRN
  Administered 2016-01-27: 60 mg via INTRAVENOUS

## 2016-01-27 MED ORDER — PROPOFOL 10 MG/ML IV BOLUS
INTRAVENOUS | Status: AC
  Filled 2016-01-27: qty 20

## 2016-01-27 MED ORDER — SODIUM CHLORIDE 0.9 % IV SOLN
80.0000 mg | Freq: Once | INTRAVENOUS | Status: AC
  Administered 2016-01-27: 80 mg via INTRAVENOUS
  Filled 2016-01-27: qty 80

## 2016-01-27 MED ORDER — FENTANYL CITRATE (PF) 100 MCG/2ML IJ SOLN: 50.0000 ug | INTRAMUSCULAR | Status: DC | PRN

## 2016-01-27 MED ORDER — SODIUM CHLORIDE 0.9 % IV SOLN: INTRAVENOUS | Status: DC

## 2016-01-27 MED ORDER — PROPOFOL 1000 MG/100ML IV EMUL
0.0000 ug/kg/min | INTRAVENOUS | Status: DC
  Administered 2016-01-27: 30 ug/kg/min via INTRAVENOUS
  Administered 2016-01-27: 40 ug/kg/min via INTRAVENOUS
  Administered 2016-01-28: 35 ug/kg/min via INTRAVENOUS
  Filled 2016-01-27 (×2): qty 100

## 2016-01-27 MED ORDER — PROPOFOL 10 MG/ML IV BOLUS
INTRAVENOUS | Status: DC | PRN
  Administered 2016-01-27: 100 ug/kg/min via INTRAVENOUS

## 2016-01-27 MED ORDER — CHLORHEXIDINE GLUCONATE 0.12% ORAL RINSE (MEDLINE KIT)
15.0000 mL | Freq: Two times a day (BID) | OROMUCOSAL | Status: DC
  Administered 2016-01-27 – 2016-01-28 (×3): 15 mL via OROMUCOSAL

## 2016-01-27 MED ORDER — METOPROLOL TARTRATE 1 MG/ML IV SOLN: 5.0000 mg | INTRAVENOUS | Status: DC | PRN

## 2016-01-27 MED ORDER — MIDAZOLAM HCL 2 MG/2ML IJ SOLN
INTRAMUSCULAR | Status: AC
Start: 2016-01-27 — End: 2016-01-28
  Filled 2016-01-27: qty 2

## 2016-01-27 MED ORDER — LACTATED RINGERS IV SOLN
INTRAVENOUS | Status: DC | PRN
  Administered 2016-01-27: 13:00:00 via INTRAVENOUS

## 2016-01-27 MED ORDER — ANTISEPTIC ORAL RINSE SOLUTION (CORINZ)
7.0000 mL | OROMUCOSAL | Status: DC
  Administered 2016-01-27 – 2016-01-28 (×11): 7 mL via OROMUCOSAL

## 2016-01-27 MED ORDER — SODIUM CHLORIDE 0.9 % IV SOLN
INTRAVENOUS | Status: DC
  Administered 2016-01-27: 18:00:00 via INTRAVENOUS

## 2016-01-27 MED ORDER — PHENYLEPHRINE HCL 10 MG/ML IJ SOLN
INTRAMUSCULAR | Status: DC | PRN
  Administered 2016-01-27 (×3): 80 ug via INTRAVENOUS
  Administered 2016-01-27 (×2): 160 ug via INTRAVENOUS
  Administered 2016-01-27: 80 ug via INTRAVENOUS

## 2016-01-27 MED ORDER — MIDAZOLAM HCL 2 MG/2ML IJ SOLN
2.0000 mg | Freq: Once | INTRAMUSCULAR | Status: AC
  Administered 2016-01-27: 2 mg via INTRAVENOUS

## 2016-01-27 MED ORDER — SODIUM CHLORIDE 0.9 % IV SOLN: 250.0000 mL | INTRAVENOUS | Status: DC | PRN

## 2016-01-27 MED ORDER — MIDAZOLAM HCL 2 MG/2ML IJ SOLN
INTRAMUSCULAR | Status: AC
  Filled 2016-01-27: qty 2

## 2016-01-27 MED ORDER — PANTOPRAZOLE SODIUM 40 MG IV SOLR: 40.0000 mg | Freq: Two times a day (BID) | INTRAVENOUS | Status: DC

## 2016-01-27 MED ORDER — SODIUM CHLORIDE 0.9 % IV SOLN
INTRAVENOUS | Status: DC | PRN
  Administered 2016-01-27 (×2): via INTRAVENOUS

## 2016-01-27 MED ORDER — PROPOFOL 500 MG/50ML IV EMUL
INTRAVENOUS | Status: DC | PRN
  Administered 2016-01-27: 30 mg via INTRAVENOUS

## 2016-01-27 MED ORDER — PROPOFOL 500 MG/50ML IV EMUL
INTRAVENOUS | Status: DC | PRN
  Administered 2016-01-27: 80 ug/kg/min via INTRAVENOUS

## 2016-01-27 MED ORDER — FENTANYL CITRATE (PF) 100 MCG/2ML IJ SOLN
50.0000 ug | INTRAMUSCULAR | Status: DC | PRN
  Administered 2016-01-27: 50 ug via INTRAVENOUS
  Filled 2016-01-27: qty 2

## 2016-01-27 MED ORDER — PHENYLEPHRINE 40 MCG/ML (10ML) SYRINGE FOR IV PUSH (FOR BLOOD PRESSURE SUPPORT)
PREFILLED_SYRINGE | INTRAVENOUS | Status: AC
  Filled 2016-01-27: qty 10

## 2016-01-27 MED ORDER — ONDANSETRON HCL 4 MG/2ML IJ SOLN: 4.0000 mg | Freq: Four times a day (QID) | INTRAMUSCULAR | Status: DC | PRN

## 2016-01-27 MED ORDER — CHLORHEXIDINE GLUCONATE CLOTH 2 % EX PADS
6.0000 | MEDICATED_PAD | Freq: Every day | CUTANEOUS | Status: DC
  Administered 2016-01-28 – 2016-01-30 (×3): 6 via TOPICAL

## 2016-01-27 MED ORDER — OCTREOTIDE ACETATE 50 MCG/ML IJ SOLN
50.0000 ug | Freq: Once | INTRAMUSCULAR | Status: DC
  Filled 2016-01-27: qty 1

## 2016-01-27 NOTE — Consult Note (Signed)
PULMONARY / CRITICAL CARE MEDICINE   Name: Tiffany [name] MRN: [id number] DOB: [birth year]    ADMISSION DATE:  01/27/2016 CONSULTATION DATE:  01/27/16  REFERRING MD:  Dr. Amedeo Plenty  CHIEF COMPLAINT:  GIB, Intubated for Airway Protection  HISTORY OF PRESENT ILLNESS:  82 year old female with a past medical history of hypertension, chronic systolic heart failure, atrial fibrillation, arthritis, dementia (on Namenda), seasonal allergies, Raynaud's disease, GERD, prior GIB in 02/2015 thought to be related to a Dieulafoy lesion and persistent anemia s/p colonoscopy / capsule endoscopy that were unrevealing for anemia source who presented to [facility] on 2/17 after a near syncopal episode and vomiting up blood.    At baseline the patient lives at home with her spouse in a 1 level home. She continues to drive and goes to the [facility] for exercise. She is able to bathe / feed herself, some shopping and light cooking.  Had a caretaker for approximately 6 weeks early in 2016 after hospitalization.  After the 02/2015 admission she was seen by Dr. Lenna Gilford was able to come off oxygen after discharge. She has been very cautious to avoid caustic medications.  Family reports she was a social drinker (never heavy) but has not drank in the last 5 years.  Patient was in her usual state of health until 6:30 AM on 2/17 at which time she vomited up blood. Initially they were unsure that it was blood however a second episode had multiple clots. She also had a near syncopal episode. She denied LOC on admission. On presentation she denied black tarry or bloody stools.  GI was consulted in the emergency room and the patient taken to endoscopy for EGD. Notes reflect esophagus and proximal stomach were full of blood & clots in the fundus.  There also appeared to be a distal esophageal varix bleeding and multiple bands were placed. Estimated blood loss approximately 500 ML's per endoscopy staff. The patient was returned to ICU intubated.    PCCM  consulted for evaluation.  PAST MEDICAL HISTORY :  She  has a past medical history of Hypertension; Arthritis; Dementia; Allergic rhinitis; GI bleed; Raynaud's disease; GERD (gastroesophageal reflux disease); Acute respiratory failure ([place]); Pleural effusion; ILD (interstitial lung disease) ([place]); Pulmonary edema; Chronic diastolic (congestive) heart failure ([place]); Chronic systolic heart failure ([place]); and A-fib ([place]).  PAST SURGICAL HISTORY: She  has past surgical history that includes Back surgery (09/25/2011); Abdominal hysterectomy; Esophagogastroduodenoscopy (N/A, 03/01/2015); Esophagogastroduodenoscopy (N/A, 03/02/2015); Esophagogastroduodenoscopy (egd) with propofol (N/A, 06/25/2015); Colonoscopy with propofol (N/A, 06/27/2015); and Givens capsule study (N/A, 06/27/2015).  No Known Allergies  No current facility-administered medications on file prior to encounter.   Current Outpatient Prescriptions on File Prior to Encounter  Medication Sig  . acetaminophen (TYLENOL) 500 MG tablet Take 500 mg by mouth every 6 (six) hours as needed for mild pain, moderate pain or headache.  . Famotidine (PEPCID PO) Take 1 tablet by mouth daily as needed (heartburn). Reported on 01/02/2016  . ferrous sulfate 325 (65 FE) MG tablet Take 325 mg by mouth daily with breakfast.  . fluticasone (FLONASE) 50 MCG/ACT nasal spray Place 2 sprays into both nostrils daily.  . furosemide (LASIX) 40 MG tablet Take 0.5 tablets (20 mg total) by mouth daily.  . metoprolol succinate (TOPROL-XL) 25 MG 24 hr tablet Take 0.5 tablets (12.5 mg total) by mouth daily.  . mirtazapine (REMERON) 15 MG tablet Take 1 tablet (15 mg total) by mouth at bedtime.  Marland Kitchen NAMENDA XR 28 MG CP24 24 hr capsule TAKE  1 CAPSULE (28 MG TOTAL) BY MOUTH DAILY.  . pantoprazole (PROTONIX) 40 MG tablet Take 1 tablet (40 mg total) by mouth 2 (two) times daily.  . Potassium Chloride ER 20 MEQ TBCR TAKE 1 TABLET (20 MEQ TOTAL) BY MOUTH DAILY.    FAMILY HISTORY:   Her indicated that her mother is deceased. She indicated that her father is deceased.   SOCIAL HISTORY: She  reports that she has never smoked. She has never used smokeless tobacco. She reports that she does not drink alcohol or use illicit drugs.  REVIEW OF SYSTEMS:   Unable to complete as patient is altered on mechanical ventilation. Information obtained from staff, family and prior medical documentation.  SUBJECTIVE:    VITAL SIGNS: BP 166/74 mmHg  Pulse 89  Temp(Src) 98.1 F (36.7 C) (Oral)  Resp 18  Ht 5\' 1"  (1.549 m)  Wt 140 lb (63.504 kg)  BMI 26.47 kg/m2  SpO2 100%  HEMODYNAMICS:    VENTILATOR SETTINGS: Vent Mode:  [-] PRVC FiO2 (%):  [100 %] 100 % Set Rate:  [18 bmp] 18 bmp Vt Set:  [500 mL] 500 mL PEEP:  [5 cmH20] 5 cmH20 Plateau Pressure:  [18 cmH20] 18 cmH20  INTAKE / OUTPUT:    PHYSICAL EXAMINATION: General:  Elderly female in NAD on vent  Neuro:  Sedated, moves all extremities spontaneously  HEENT:  ETT, bloody secretions from mouth, small amount in vent tubing Cardiovascular:  s1s2 rrr, no m/r/g Lungs:  Even/non-labored, lungs bilaterally clear Abdomen:  Soft, non-distented Musculoskeletal:  No acute deformities  Skin:  Warm/dry, no edema   LABS:  BMET  Recent Labs Lab 01/27/16 1124  NA 145  K 4.4  CL 109  CO2 25  BUN 40*  CREATININE 1.18*  GLUCOSE 151*    Electrolytes  Recent Labs Lab 01/27/16 1124  CALCIUM 8.7*    CBC  Recent Labs Lab 01/27/16 1124  WBC 10.6*  HGB 10.7*  HCT 34.5*  PLT 111*    Coag's  Recent Labs Lab 01/27/16 1124  INR 1.25    Sepsis Markers No results for input(s): LATICACIDVEN, PROCALCITON, O2SATVEN in the last 168 hours.  ABG No results for input(s): PHART, PCO2ART, PO2ART in the last 168 hours.  Liver Enzymes  Recent Labs Lab 01/27/16 1124  AST 23  ALT 14  ALKPHOS 103  BILITOT 0.8  ALBUMIN 3.7    Cardiac Enzymes  Recent Labs Lab 01/27/16 1124  TROPONINI 0.03     Glucose No results for input(s): GLUCAP in the last 168 hours.  Imaging No results found.   STUDIES:  2/17 EGD >> ? esophageal varices, multiple bands  CULTURES:   ANTIBIOTICS: Vanco 2/17 >>  Zosyn 2/17 >>   SIGNIFICANT EVENTS: 2/17  Admit with hematemesis   LINES/TUBES: ETT 2/17 >>  R IJ TLC 2/17 >>   DISCUSSION: 81 y/o F with PMH of Dieulafoy lesion, mild dementia, AF, chronic sCHF, & HTN admitted with hematemesis thought to be related to esophageal varices (not present on prior EGD < 1 year ago).  Returned to ICU on vent for airway protection.    ASSESSMENT / PLAN:  GASTROINTESTINAL A:   Acute GIB - s/p EGD 2/17 per Dr. Amedeo Plenty with esophageal varices  Hx of Dieulafoy Lesion P:   Monitor for further bleeding Assess RUQ [place]  Consider CT Imaging of ABD once stabilized  Protonix gtt Octreotide gtt  NPO NO OGT with multiple bands  PULMONARY A: Intubated for Airway Protection  Possible  Aspiration in setting of Hematemesis  LLL Airspace Disease / CAP - infiltrate +/- effusion  Hx Hypoxic Respiratory Failure - after prior 02/2015 admission for GIB, able to come off O2.  Works out at [employer]. P P:   MV support, 8cc/kg Wean PEEP / FiO2 for sats > 92% Intermittent CXR  See ID   CARDIOVASCULAR A:  Hx HTN, PAF, Chronic sCHF  P:  Monitor hemodynamics in ICU Hold home Toprol, lasix / KCL for now PRN lopressor for SBP > 180 DNR in the event of cardiac arrest Assess EKG, troponin   RENAL A:   At Risk AKI - in setting of GIB  P:   Trend BMP / UOP  Replace electrolytes as indicated   HEMATOLOGIC A:   Anemia - persistent since 02/2015, prior negative colonoscopy and capsule endoscopy.   P:  Trend CBC  2 units PRBC's now Repeat H/H post transfusion  SCD's for DVT prophylaxis   INFECTIOUS A:   Possible CAP + Aspiration - LLL airspace disease on initial CXR  P:   Monitor fever curve / WBC Trend PCT  ABX as above  ENDOCRINE A:   At Risk  Hypoglycemia  P:   Trend glucose on BMP  NEUROLOGIC A:   ICU associated pain / discomfort  Hx Dementia  P:   RASS goal: -1 Propofol for sedation  PRN fentanyl for pain  Hold home Namenda, remeron   FAMILY  - Updates:  Family updated at bedside.  Goals of care discussed - continue current medical therapies.  Hopeful to stabilize and return to baseline.  In the event of cardiac arrest, no heroic measures.    - Inter-disciplinary family meet or Palliative Care meeting due by:  Ongoing     Noe Gens, NP-C [facility] Pgr: [phone] or if no answer [phone] 01/27/2016, 3:27 PM

## 2016-01-27 NOTE — Consult Note (Signed)
[facility] Consult Note  Referring Provider: No ref. provider found Primary Care Physician:  Kennyth Arnold, FNP Primary Gastroenterologist:  Dr.  Laurel Dimmer Complaint: Hematemesis HPI: Tiffany [name] is an 82 y.o. white female  with history of GI bleeding from a presumed dieulafoy lesion in 2016 with colonoscopy and capsule endoscopy later unrevealing for anemia who presents with frank hematemesis. She was doing well until this morning. She felt near syncopal at the onset. She has not had a bloody bowel movement.  Past Medical History  Diagnosis Date  . Hypertension   . Arthritis   . Dementia   . Allergic rhinitis   . GI bleed   . Raynaud's disease   . GERD (gastroesophageal reflux disease)   . Acute respiratory failure ([place])   . Pleural effusion   . ILD (interstitial lung disease) ([place])   . Pulmonary edema   . Chronic diastolic (congestive) heart failure ([place])   . Chronic systolic heart failure ([place])   . A-fib [facility])     Past Surgical History  Procedure Laterality Date  . Back surgery  09/25/2011  . Abdominal hysterectomy    . Esophagogastroduodenoscopy N/A 03/01/2015    Procedure: ESOPHAGOGASTRODUODENOSCOPY (EGD);  Surgeon: Arta Silence, MD;  Location: [facility]COPY;  Service: Endoscopy;  Laterality: N/A;  . Esophagogastroduodenoscopy N/A 03/02/2015    Procedure: ESOPHAGOGASTRODUODENOSCOPY (EGD);  Surgeon: Arta Silence, MD;  Location: [facility]OS[facility];  Service: Endoscopy;  Laterality: N/A;  bedside  . Esophagogastroduodenoscopy (egd) with propofol N/A 06/25/2015    Procedure: ESOPHAGOGASTRODUODENOSCOPY (EGD) WITH PROPOFOL;  Surgeon: Clarene Essex, MD;  Location: [facility] ENDOSCOPY;  Service: Endoscopy;  Laterality: N/A;  . Colonoscopy with propofol N/A 06/27/2015    Procedure: COLONOSCOPY WITH PROPOFOL;  Surgeon: Arta Silence, MD;  Location: [facility] ENDOSCOPY;  Service: Endoscopy;  Laterality: N/A;  . Givens capsule study N/A 06/27/2015    Procedure: GIVENS CAPSULE STUDY;   Surgeon: Arta Silence, MD;  Location: [facility] ENDOSCOPY;  Service: Endoscopy;  Laterality: N/A;    Medications Prior to Admission  Medication Sig Dispense Refill  . acetaminophen (TYLENOL) 500 MG tablet Take 500 mg by mouth every 6 (six) hours as needed for mild pain, moderate pain or headache.    . Famotidine (PEPCID PO) Take 1 tablet by mouth daily as needed (heartburn). Reported on 01/02/2016    . ferrous sulfate 325 (65 FE) MG tablet Take 325 mg by mouth daily with breakfast.    . fluticasone (FLONASE) 50 MCG/ACT nasal spray Place 2 sprays into both nostrils daily. 16 g 1  . furosemide (LASIX) 40 MG tablet Take 0.5 tablets (20 mg total) by mouth daily. 30 tablet 5  . metoprolol succinate (TOPROL-XL) 25 MG 24 hr tablet Take 0.5 tablets (12.5 mg total) by mouth daily. 15 tablet 6  . mirtazapine (REMERON) 15 MG tablet Take 1 tablet (15 mg total) by mouth at bedtime. 90 tablet 1  . NAMENDA XR 28 MG CP24 24 hr capsule TAKE 1 CAPSULE (28 MG TOTAL) BY MOUTH DAILY. 30 capsule 2  . pantoprazole (PROTONIX) 40 MG tablet Take 1 tablet (40 mg total) by mouth 2 (two) times daily. 180 tablet 1  . Potassium Chloride ER 20 MEQ TBCR TAKE 1 TABLET (20 MEQ TOTAL) BY MOUTH DAILY. 30 tablet 5    Allergies: No Known Allergies  Family History  Problem Relation Age of Onset  . Heart attack Brother   . Ovarian cancer Mother   . Osteoarthritis Mother   . Colon cancer Sister  Social History:  reports that she has never smoked. She has never used smokeless tobacco. She reports that she does not drink alcohol or use illicit drugs.  Review of Systems: negative except as above   Blood pressure 131/69, pulse 92, temperature 98.1 F (36.7 C), temperature source Oral, resp. rate 21, height '5\' 1"'$  (1.549 m), weight 63.504 kg (140 lb), SpO2 95 %. Head: Normocephalic, without obvious abnormality, atraumatic Neck: no adenopathy, no carotid bruit, no JVD, supple, symmetrical, trachea midline and thyroid not enlarged,  symmetric, no tenderness/mass/nodules Resp: clear to auscultation bilaterally Cardio: regular rate and rhythm, S1, S2 normal, no murmur, click, rub or gallop GI: Abdomen soft nondistended with normoactive bowel sounds. No hepatosplenomegaly mass or guarding Extremities: extremities normal, atraumatic, no cyanosis or edema  Results for orders placed or performed during the hospital encounter of 01/27/16 (from the past 48 hour(s))  Comprehensive metabolic panel     Status: Abnormal   Collection Time: 01/27/16 11:24 AM  Result Value Ref Range   Sodium 145 135 - 145 mmol/L   Potassium 4.4 3.5 - 5.1 mmol/L   Chloride 109 101 - 111 mmol/L   CO2 25 22 - 32 mmol/L   Glucose, Bld 151 (H) 65 - 99 mg/dL   BUN 40 (H) 6 - 20 mg/dL   Creatinine, Ser 1.18 (H) 0.44 - 1.00 mg/dL   Calcium 8.7 (L) 8.9 - 10.3 mg/dL   Total Protein 6.0 (L) 6.5 - 8.1 g/dL   Albumin 3.7 3.5 - 5.0 g/dL   AST 23 15 - 41 U/L   ALT 14 14 - 54 U/L   Alkaline Phosphatase 103 38 - 126 U/L   Total Bilirubin 0.8 0.3 - 1.2 mg/dL   GFR calc non Af Amer 41 (L) >60 mL/min   GFR calc Af Amer 48 (L) >60 mL/min    Comment: (NOTE) The eGFR has been calculated using the CKD EPI equation. This calculation has not been validated in all clinical situations. eGFR's persistently <60 mL/min signify possible Chronic Kidney Disease.    Anion gap 11 5 - 15  Lipase, blood     Status: None   Collection Time: 01/27/16 11:24 AM  Result Value Ref Range   Lipase 31 11 - 51 U/L  Troponin I     Status: None   Collection Time: 01/27/16 11:24 AM  Result Value Ref Range   Troponin I 0.03 <0.031 ng/mL    Comment:        NO INDICATION OF MYOCARDIAL INJURY.   Brain natriuretic peptide     Status: None   Collection Time: 01/27/16 11:24 AM  Result Value Ref Range   B Natriuretic Peptide 75.5 0.0 - 100.0 pg/mL  CBC with Differential     Status: Abnormal   Collection Time: 01/27/16 11:24 AM  Result Value Ref Range   WBC 10.6 (H) 4.0 - 10.5 K/uL    RBC 3.69 (L) 3.87 - 5.11 MIL/uL   Hemoglobin 10.7 (L) 12.0 - 15.0 g/dL   HCT 34.5 (L) 36.0 - 46.0 %   MCV 93.5 78.0 - 100.0 fL   MCH 29.0 26.0 - 34.0 pg   MCHC 31.0 30.0 - 36.0 g/dL   RDW 14.1 11.5 - 15.5 %   Platelets 111 (L) 150 - 400 K/uL    Comment: REPEATED TO VERIFY SPECIMEN CHECKED FOR CLOTS PLATELET COUNT CONFIRMED BY SMEAR    Neutrophils Relative % 88 %   Neutro Abs 9.3 (H) 1.7 - 7.7 K/uL  Lymphocytes Relative 9 %   Lymphs Abs 0.9 0.7 - 4.0 K/uL   Monocytes Relative 3 %   Monocytes Absolute 0.3 0.1 - 1.0 K/uL   Eosinophils Relative 1 %   Eosinophils Absolute 0.1 0.0 - 0.7 K/uL   Basophils Relative 0 %   Basophils Absolute 0.0 0.0 - 0.1 K/uL  Protime-INR     Status: Abnormal   Collection Time: 01/27/16 11:24 AM  Result Value Ref Range   Prothrombin Time 15.8 (H) 11.6 - 15.2 seconds   INR 1.25 0.00 - 1.49  Type and screen [place]     Status: None (Preliminary result)   Collection Time: 01/27/16 11:24 AM  Result Value Ref Range   ABO/RH(D) B NEG    Antibody Screen NEG    Sample Expiration 01/30/2016    Unit Number [id number]    Blood Component Type RED CELLS,LR    Unit division 00    Status of Unit ISSUED    Transfusion Status OK TO TRANSFUSE    Crossmatch Result Compatible    Unit Number [id number]    Blood Component Type RBC LR PHER2    Unit division 00    Status of Unit ALLOCATED    Transfusion Status OK TO TRANSFUSE    Crossmatch Result Compatible   POC occult blood, ED Provider will collect     Status: None   Collection Time: 01/27/16 12:25 PM  Result Value Ref Range   Fecal Occult Bld NEGATIVE NEGATIVE  Prepare RBC     Status: None   Collection Time: 01/27/16  2:12 PM  Result Value Ref Range   Order Confirmation ORDER PROCESSED BY BLOOD BANK   Prepare RBC     Status: None   Collection Time: 01/27/16  3:00 PM  Result Value Ref Range   Order Confirmation ORDER PROCESSED BY BLOOD BANK    No results  found.  Assessment: Hematemesis, reported history of proximal gastric AVM in the past. Plan:  Will proceed with EGD. Demaya Hardge C 01/27/2016, 2:32 PM  Pager [phone] If no answer or after 5 PM call [phone]

## 2016-01-27 NOTE — Procedures (Signed)
Central Venous Catheter Insertion Procedure Note Tiffany [name] [id number] 1929-05-28  Procedure: Insertion of Central Venous Catheter Indications: Drug and/or fluid administration  Procedure Details Consent: Risks of procedure as well as the alternatives and risks of each were explained to the (patient/caregiver).  Consent for procedure obtained. Time Out: Verified patient identification, verified procedure, site/side was marked, verified correct patient position, special equipment/implants available, medications/allergies/relevent history reviewed, required imaging and test results available.  Performed  Maximum sterile technique was used including antiseptics, cap, gloves, gown, hand hygiene, mask and sheet. Skin prep: Chlorhexidine; local anesthetic administered A antimicrobial bonded/coated triple lumen catheter was placed in the right internal jugular vein using the Seldinger technique.  Evaluation Blood flow good Complications: No apparent complications Patient did tolerate procedure well. Chest X-ray ordered to verify placement.  CXR: normal.  Tiffany [name] 01/27/2016, 7:17 PM

## 2016-01-27 NOTE — Op Note (Signed)
[facility] [place], [address]   ENDOSCOPY PROCEDURE REPORT  PATIENT: Tiffany [name], Tiffany [name]  MR#: [id number] BIRTHDATE: [birth year] , 75  yrs. old GENDER: female ENDOSCOPIST: Teena Irani, MD REFERRED BY: PROCEDURE DATE:  2016/02/05 PROCEDURE: ASA CLASS: INDICATIONS:  hematemesis MEDICATIONS: Mack, converted to general anesthesia TOPICAL ANESTHETIC:  DESCRIPTION OF PROCEDURE: After the risks benefits and alternatives of the procedure were thoroughly explained, informed consent was obtained.  The [id number] endoscope was introduced through the mouth and advanced to the second portion of the duodenum , Without limitations.  The instrument was slowly withdrawn as the mucosa was fully examined. Estimated blood loss is zero unless otherwise noted in this procedure report.    sophagus and proximal stomach were full of blood. There were a lot of clots in the fundus. I never entered the duodenum because it was clear that she was having bleeding from the distal esophagus or proximal stomach.It was felt that the patient could not protect her airway and the procedure was paused and she was intubatedand the procedure resumed. There appeared to be a distal esophageal varix bleeding based on gushing of blood in the distal esophagus and visualization of other varices. Multiple bands were fired but visualization was very poor and at least the first through 4 bands did not stop the bleeding. 2 other bands misfired. A second banding apparatus was passed and it appeared the bleeding slowed down. I could not see a distinct bleeding source but placed 2 more bands and there appeared to be cessation of bleeding.              The scope was then withdrawn from the patient and the procedure completed.  COMPLICATIONS: There were no immediate complications.  ENDOSCOPIC IMPRESSION: esophageal varices, appeared  to cease  after multiple  bands fired.  RECOMMENDATIONS: ICU care, transfusion, octreotide continue mechanical ventilation until cleared by ICU. team.  REPEAT EXAM:  eSigned:  Teena Irani, MD 05-Feb-2016 2:31 PM    CC:  CPT CODES: ICD CODES:  The ICD and CPT codes recommended by this software are interpretations from the data that the clinical staff has captured with the software.  The verification of the translation of this report to the ICD and CPT codes and modifiers is the sole responsibility of the health care institution and practicing physician where this report was generated.  [employer] will not be held responsible for the validity of the ICD and CPT codes included on this report.  AMA assumes no liability for data contained or not contained herein. CPT is a Designer, television/film set of the [employer].

## 2016-01-27 NOTE — Anesthesia Procedure Notes (Signed)
Procedure Name: Intubation Date/Time: 01/27/2016 1:51 PM Performed by: Gaston Islam EVETTE Pre-anesthesia Checklist: Patient identified, Emergency Drugs available, Suction available and Patient being monitored Patient Re-evaluated:Patient Re-evaluated prior to inductionOxygen Delivery Method: Circle system utilized Preoxygenation: Pre-oxygenation with 100% oxygen Intubation Type: IV induction Ventilation: Mask ventilation without difficulty and Oral airway inserted - appropriate to patient size Laryngoscope Size: 4 Grade View: Grade I Tube type: Oral Tube size: 7.5 mm Number of attempts: 1 Airway Equipment and Method: Stylet Placement Confirmation: ETT inserted through vocal cords under direct vision,  positive ETCO2,  CO2 detector and breath sounds checked- equal and bilateral Secured at: 22 cm Tube secured with: Tape

## 2016-01-27 NOTE — ED Provider Notes (Signed)
CSN: [id number]     Arrival date & time 01/27/16  1038 History   First MD Initiated Contact with Patient 01/27/16 1055     Chief Complaint  Patient presents with  . Hematemesis    pt reports spitting blood from her mouth  . Fall  . Weakness     (Consider location/radiation/quality/duration/timing/severity/associated sxs/prior Treatment) HPI Patient vomited up blood several times this morning. The first episode occurred at 6:30. At that time her husband reports that the material looked stranding and he wasn't sure that it was blood. A second episode occurred where the patient reports that she brought out to handfuls of red clotted blood. Her daughter reports that when she went over to the house over several areas of additional recent blood. The patient definitely identifies a blood is coming from her stomach and esophagus. She denies that this is being coughed up. She denies abdominal pain or chest pain. The patient apparently had an episode this morning when this was occurring where she became very weak and had to kneel down to the floor. She denies that she had a loss of consciousness but may have been close. She denies a fall that precipitated any injury. The patient reports that up until this morning she has been feeling well and doing usual activities including working out at [employer]. She reports a prior history of GI bleed and anemia. She takes daily PROTONIX. She reports bowel movements have been normal and she has not seen any black tarry or bloody stool.   Daughter reports in May the patient had aggressive episodes of vomiting up large amounts of blood. A source was not identified. She had a prolonged hospitalization. She again had an episode of vomiting blood in July and at that point time had upper and lower scopes without identifying source. She has not had production of blood since that time until today.   Of note, the patient was taken by RN to the bathroom and had a large formed  brown stool where no blood or melena was observed by the nurse. She did not guaiac test to that specimen. Past Medical History  Diagnosis Date  . Hypertension   . Arthritis   . Dementia   . Allergic rhinitis   . GI bleed   . Raynaud's disease   . GERD (gastroesophageal reflux disease)   . Acute respiratory failure ([place])   . Pleural effusion   . ILD (interstitial lung disease) ([place])   . Pulmonary edema   . Chronic diastolic (congestive) heart failure ([place])   . Chronic systolic heart failure ([place])   . A-fib [facility])    Past Surgical History  Procedure Laterality Date  . Back surgery  09/25/2011  . Abdominal hysterectomy    . Esophagogastroduodenoscopy N/A 03/01/2015    Procedure: ESOPHAGOGASTRODUODENOSCOPY (EGD);  Surgeon: Arta Silence, MD;  Location: [facility] ENDOSCOPY;  Service: Endoscopy;  Laterality: N/A;  . Esophagogastroduodenoscopy N/A 03/02/2015    Procedure: ESOPHAGOGASTRODUODENOSCOPY (EGD);  Surgeon: Arta Silence, MD;  Location: [facility] ENDOSCOPY;  Service: Endoscopy;  Laterality: N/A;  bedside  . Esophagogastroduodenoscopy (egd) with propofol N/A 06/25/2015    Procedure: ESOPHAGOGASTRODUODENOSCOPY (EGD) WITH PROPOFOL;  Surgeon: Clarene Essex, MD;  Location: [facility] ENDOSCOPY;  Service: Endoscopy;  Laterality: N/A;  . Colonoscopy with propofol N/A 06/27/2015    Procedure: COLONOSCOPY WITH PROPOFOL;  Surgeon: Arta Silence, MD;  Location: [facility] ENDOSCOPY;  Service: Endoscopy;  Laterality: N/A;  . Givens capsule study N/A 06/27/2015    Procedure: GIVENS CAPSULE STUDY;  Surgeon: Arta Silence, MD;  Location: [facility] ENDOSCOPY;  Service: Endoscopy;  Laterality: N/A;   Family History  Problem Relation Age of Onset  . Heart attack Brother   . Ovarian cancer Mother   . Osteoarthritis Mother   . Colon cancer Sister    Social History  Substance Use Topics  . Smoking status: Never Smoker   . Smokeless tobacco: Never Used  . Alcohol Use: No   OB History    No data available     Review of  Systems 10 Systems reviewed and are negative for acute change except as noted in the HPI.    Allergies  Review of patient's allergies indicates no known allergies.  Home Medications   Prior to Admission medications   Medication Sig Start Date End Date Taking? Authorizing Provider  acetaminophen (TYLENOL) 500 MG tablet Take 500 mg by mouth every 6 (six) hours as needed for mild pain, moderate pain or headache.   Yes Historical Provider, MD  Famotidine (PEPCID PO) Take 1 tablet by mouth daily as needed (heartburn). Reported on 01/02/2016   Yes Historical Provider, MD  ferrous sulfate 325 (65 FE) MG tablet Take 325 mg by mouth daily with breakfast.   Yes Historical Provider, MD  fluticasone (FLONASE) 50 MCG/ACT nasal spray Place 2 sprays into both nostrils daily. 10/05/15  Yes Kennyth Arnold, FNP  furosemide (LASIX) 40 MG tablet Take 0.5 tablets (20 mg total) by mouth daily. 06/28/15  Yes Barton Dubois, MD  metoprolol succinate (TOPROL-XL) 25 MG 24 hr tablet Take 0.5 tablets (12.5 mg total) by mouth daily. 05/06/15  Yes Pixie Casino, MD  mirtazapine (REMERON) 15 MG tablet Take 1 tablet (15 mg total) by mouth at bedtime. 11/01/15  Yes Kennyth Arnold, FNP  NAMENDA XR 28 MG CP24 24 hr capsule TAKE 1 CAPSULE (28 MG TOTAL) BY MOUTH DAILY. 10/10/15  Yes Adam Telford Nab, DO  pantoprazole (PROTONIX) 40 MG tablet Take 1 tablet (40 mg total) by mouth 2 (two) times daily. 07/04/15  Yes Kennyth Arnold, FNP  Potassium Chloride ER 20 MEQ TBCR TAKE 1 TABLET (20 MEQ TOTAL) BY MOUTH DAILY. 11/10/15  Yes Noralee Space, MD   BP 131/69 mmHg  Pulse 92  Temp(Src) 98.1 F (36.7 C) (Oral)  Resp 21  Ht 5\' 1"  (1.549 m)  Wt 140 lb (63.504 kg)  BMI 26.47 kg/m2  SpO2 95% Physical Exam  Constitutional: She is oriented to person, place, and time. She appears well-developed and well-nourished.  Patient appears slightly pale. She is alert. She does not have acute respiratory distress.  HENT:  Head: Normocephalic and  atraumatic.  The patient has drying blood that was read on her chin and around her mouth. The posterior airway is patent without any blood present at this time.  After a another episode of spitting out blood clots at 12:05. I have reexamined the oral cavity. There is no blood in the nasal passages and no blood streaking the posterior nasopharynx. I have examined under the tongue and the tonsillar pillars without evidence of any areas of bleeding. The patient at times spits out a moderately sized (a couple of mL) semi-formed clot of blood without active retching or coughing.  Neck: Neck supple.  Cardiovascular: Normal rate, regular rhythm, normal heart sounds and intact distal pulses.   Pulmonary/Chest: Effort normal and breath sounds normal.  Abdominal: Soft. She exhibits no distension. There is no tenderness.  Genitourinary:  Digital rectal examination does not show any  melena. Brown thin stool in vault.  Musculoskeletal: Normal range of motion. She exhibits no edema or tenderness.  No calf tenderness or peripheral edema.  Neurological: She is alert and oriented to person, place, and time. No cranial nerve deficit. She exhibits normal muscle tone. Coordination normal.  Skin: Skin is warm and dry. There is pallor.  Psychiatric: She has a normal mood and affect.    ED Course  Procedures (including critical care time) CRITICAL CARE Performed by: Charlesetta Shanks   Total critical care time: 30 minutes  Critical care time was exclusive of separately billable procedures and treating other patients.  Critical care was necessary to treat or prevent imminent or life-threatening deterioration.  Critical care was time spent personally by me on the following activities: development of treatment plan with patient and/or surrogate as well as nursing, discussions with consultants, evaluation of patient's response to treatment, examination of patient, obtaining history from patient or surrogate,  ordering and performing treatments and interventions, ordering and review of laboratory studies, ordering and review of radiographic studies, pulse oximetry and re-evaluation of patient's condition. Labs Review Labs Reviewed  COMPREHENSIVE METABOLIC PANEL - Abnormal; Notable for the following:    Glucose, Bld 151 (*)    BUN 40 (*)    Creatinine, Ser 1.18 (*)    Calcium 8.7 (*)    Total Protein 6.0 (*)    GFR calc non Af Amer 41 (*)    GFR calc Af Amer 48 (*)    All other components within normal limits  CBC WITH DIFFERENTIAL/PLATELET - Abnormal; Notable for the following:    WBC 10.6 (*)    RBC 3.69 (*)    Hemoglobin 10.7 (*)    HCT 34.5 (*)    Platelets 111 (*)    Neutro Abs 9.3 (*)    All other components within normal limits  PROTIME-INR - Abnormal; Notable for the following:    Prothrombin Time 15.8 (*)    All other components within normal limits  LIPASE, BLOOD  TROPONIN I  BRAIN NATRIURETIC PEPTIDE  URINALYSIS, ROUTINE W REFLEX MICROSCOPIC (NOT AT [facility])  POC OCCULT BLOOD, ED  TYPE AND SCREEN    Imaging Review No results found. I have personally reviewed and evaluated these images and lab results as part of my medical decision-making.   EKG Interpretation   Date/Time:  Friday January 27 2016 11:11:20 EST Ventricular Rate:  82 PR Interval:  182 QRS Duration: 104 QT Interval:  444 QTC Calculation: 519 R Axis:   -33 Text Interpretation:  Sinus rhythm Ventricular premature complex Left axis  deviation Low voltage, precordial leads Abnormal R-wave progression, late  transition Borderline T abnormalities, anterior leads Prolonged QT  interval SR bASELINE ARTIFACT. NO SIG CHANGE FROM PREVIOUS. Confirmed by  Johnney Killian, MD, Jeannie Done [phone]) on 01/27/2016 11:27:44 AM     GI consult (11:30) will come to the emergency department to evaluate for upper GI bleed. MDM   Final diagnoses:  Upper GI bleed   Dr. Amedeo Plenty of GI has assessed the patient in the emergency department,  he will take the patient directly to endoscopy for hematemesis. Patient's mental status remains alert. Her airway remained stable. She has had several episodes of spitting out blood clot. There was no coughing or Valsalva associated. Her mental status remained stable. The patient has had IV loading of protonix. After discussion with Dr. Amedeo Plenty, we opted not to place NG tube as she was proceeding fairly directly to upper endoscopy.    Jeannie Done  Johnney Killian, MD 01/27/16 1313

## 2016-01-27 NOTE — Patient Outreach (Signed)
[place] [facility]) Care Management  01/27/2016  Tiffany [name] Sep 22, 1929 [id number]   Patient noted to be admitted to the hospital today. Notified the hospital liaison of admit. Will send notification to physician of health coach closure and patient will be followed by the community nurse upon discharge.   Jone Baseman, RN, MSN [place] [phone]

## 2016-01-27 NOTE — ED Notes (Addendum)
1100 Pt was in bathroom in lobby supervised by daughter. Assted to wheelchair by RN. Large amount formed brown stool noted in toilet. No obvious blood

## 2016-01-27 NOTE — Anesthesia Postprocedure Evaluation (Signed)
Anesthesia Post Note  Patient: KAHMARI UDO  Procedure(s) Performed: Procedure(s) (LRB): ESOPHAGOGASTRODUODENOSCOPY (EGD) (N/A)  Patient location during evaluation: ICU Anesthesia Type: General Level of consciousness: patient remains intubated per anesthesia plan Vital Signs Assessment: post-procedure vital signs reviewed and stable Respiratory status: patient remains intubated per anesthesia plan Cardiovascular status: stable Anesthetic complications: no Comments: Transferred to ICU, intubated and sedated. PRBC's currently infusing.     Last Vitals:  Filed Vitals:   01/27/16 1500 01/27/16 1510  BP: 157/63 166/74  Pulse:  89  Temp:    Resp: 17 18    Last Pain:  Filed Vitals:   01/27/16 1513  PainSc: 0-No pain                 Effie Berkshire

## 2016-01-27 NOTE — Anesthesia Preprocedure Evaluation (Addendum)
Anesthesia Evaluation  Patient identified by MRN, date of birth, ID band Patient awake    Reviewed: Allergy & Precautions, NPO status , Patient's Chart, lab work & pertinent test results, reviewed documented beta blocker date and time   Airway Mallampati: I  TM Distance: >3 FB Neck ROM: Full    Dental  (+) Upper Dentures, Dental Advisory Given   Pulmonary    breath sounds clear to auscultation       Cardiovascular hypertension, Pt. on medications and Pt. on home beta blockers + Peripheral Vascular Disease and +CHF   Rhythm:Regular Rate:Normal     Neuro/Psych PSYCHIATRIC DISORDERS Anxiety negative neurological ROS     GI/Hepatic Neg liver ROS, GERD  Medicated,  Endo/Other  negative endocrine ROS  Renal/GU negative Renal ROS  negative genitourinary   Musculoskeletal  (+) Arthritis , Osteoarthritis,    Abdominal   Peds  Hematology negative hematology ROS (+)   Anesthesia Other Findings - Dementia - Interstitial Lung Disease  Reproductive/Obstetrics                          Lab Results  Component Value Date   WBC 10.6* 01/27/2016   HGB 10.7* 01/27/2016   HCT 34.5* 01/27/2016   MCV 93.5 01/27/2016   PLT 111* 01/27/2016   Lab Results  Component Value Date   CREATININE 1.05* 08/26/2015   BUN 32* 08/26/2015   NA 144 08/26/2015   K 3.7 08/26/2015   CL 112* 08/26/2015   CO2 26 08/26/2015   Lab Results  Component Value Date   INR 1.25 01/27/2016   INR 1.13 06/23/2015   INR 1.30 03/03/2015   01/2016 EKG: normal sinus rhythm, nonspecific ST and T waves changes, frequent PVC's noted.   Anesthesia Physical Anesthesia Plan  ASA: IV and emergent  Anesthesia Plan: MAC   Post-op Pain Management:    Induction: Intravenous  Airway Management Planned: Natural Airway  Additional Equipment:   Intra-op Plan:   Post-operative Plan:   Informed Consent: I have reviewed the patients  History and Physical, chart, labs and discussed the procedure including the risks, benefits and alternatives for the proposed anesthesia with the patient or authorized representative who has indicated his/her understanding and acceptance.   Dental advisory given  Plan Discussed with: CRNA  Anesthesia Plan Comments: (Ms. Insinga states she has not been vomiting blood but does occasionally "spit out" blood. )       Anesthesia Quick Evaluation

## 2016-01-27 NOTE — Transfer of Care (Signed)
Immediate Anesthesia Transfer of Care Note  Patient: Tiffany [name]  Procedure(s) Performed: Procedure(s): ESOPHAGOGASTRODUODENOSCOPY (EGD) (N/A)  Patient Location: PACU and ICU  Anesthesia Type:General  Level of Consciousness: sedated  Airway & Oxygen Therapy: Patient remains intubated per anesthesia plan  Post-op Assessment: Report given to RN  Post vital signs: stable  Last Vitals:  Filed Vitals:   01/27/16 1500 01/27/16 1510  BP: 157/63 166/74  Pulse:  89  Temp:    Resp: 17 18    Complications: No apparent anesthesia complications

## 2016-01-27 NOTE — ED Notes (Signed)
Pt fell this am and has c/o weakness. Pt was "coughing" up blood. Denies NVD. Pt sated that she eased herself to the floor, then got herself up to a chair and back to bed. Daughter stated that she called to home by her father due to report of coughing up blood this am

## 2016-01-28 ENCOUNTER — Inpatient Hospital Stay (HOSPITAL_COMMUNITY): Payer: PPO

## 2016-01-28 DIAGNOSIS — I8511 Secondary esophageal varices with bleeding: Secondary | ICD-10-CM

## 2016-01-28 DIAGNOSIS — J9601 Acute respiratory failure with hypoxia: Secondary | ICD-10-CM

## 2016-01-28 LAB — BASIC METABOLIC PANEL
Anion gap: 9 (ref 5–15)
BUN: 43 mg/dL — AB (ref 6–20)
CHLORIDE: 118 mmol/L — AB (ref 101–111)
CO2: 23 mmol/L (ref 22–32)
Calcium: 8.6 mg/dL — ABNORMAL LOW (ref 8.9–10.3)
Creatinine, Ser: 1.06 mg/dL — ABNORMAL HIGH (ref 0.44–1.00)
GFR calc Af Amer: 54 mL/min — ABNORMAL LOW (ref >60)
GFR, EST NON AFRICAN AMERICAN: 47 mL/min — AB (ref >60)
GLUCOSE: 140 mg/dL — AB (ref 65–99)
POTASSIUM: 4.2 mmol/L (ref 3.5–5.1)
Sodium: 150 mmol/L — ABNORMAL HIGH (ref 135–145)

## 2016-01-28 LAB — BLOOD GAS, ARTERIAL
Acid-base deficit: 3.3 mmol/L — ABNORMAL HIGH (ref 0.0–2.0)
BICARBONATE: 21.5 meq/L (ref 20.0–24.0)
DRAWN BY: 41308
FIO2: 0.4
O2 SAT: 95.9 %
PEEP: 5 cmH2O
PH ART: 7.348 — AB (ref 7.350–7.450)
Patient temperature: 98.6
RATE: 18 resp/min
TCO2: 19.6 mmol/L (ref 0–100)
VT: 380 mL
pCO2 arterial: 40.1 mmHg (ref 35.0–45.0)
pO2, Arterial: 89.3 mmHg (ref 80.0–100.0)

## 2016-01-28 LAB — CBC
HCT: 36 % (ref 36.0–46.0)
HCT: 31.8 % — ABNORMAL LOW (ref 36.0–46.0)
HEMOGLOBIN: 11.6 g/dL — AB (ref 12.0–15.0)
HEMOGLOBIN: 10.6 g/dL — AB (ref 12.0–15.0)
MCH: 30.4 pg (ref 26.0–34.0)
MCH: 31 pg (ref 26.0–34.0)
MCHC: 32.2 g/dL (ref 30.0–36.0)
MCHC: 33.3 g/dL (ref 30.0–36.0)
MCV: 94.5 fL (ref 78.0–100.0)
MCV: 93 fL (ref 78.0–100.0)
PLATELETS: 91 10*3/uL — AB (ref 150–400)
Platelets: 78 10*3/uL — ABNORMAL LOW (ref 150–400)
RBC: 3.81 MIL/uL — AB (ref 3.87–5.11)
RBC: 3.42 MIL/uL — AB (ref 3.87–5.11)
RDW: 15.9 % — ABNORMAL HIGH (ref 11.5–15.5)
RDW: 15.9 % — ABNORMAL HIGH (ref 11.5–15.5)
WBC: 11.6 10*3/uL — ABNORMAL HIGH (ref 4.0–10.5)
WBC: 17.7 10*3/uL — AB (ref 4.0–10.5)

## 2016-01-28 LAB — GLUCOSE, CAPILLARY
Glucose-Capillary: 130 mg/dL — ABNORMAL HIGH (ref 65–99)
Glucose-Capillary: 73 mg/dL (ref 65–99)
Glucose-Capillary: 174 mg/dL — ABNORMAL HIGH (ref 65–99)

## 2016-01-28 LAB — TROPONIN I: Troponin I: 0.08 ng/mL — ABNORMAL HIGH (ref <0.031)

## 2016-01-28 LAB — PROCALCITONIN

## 2016-01-28 LAB — PHOSPHORUS: PHOSPHORUS: 4.7 mg/dL — AB (ref 2.5–4.6)

## 2016-01-28 LAB — MAGNESIUM: MAGNESIUM: 1.9 mg/dL (ref 1.7–2.4)

## 2016-01-28 MED ORDER — DEXTROSE 5 % IV SOLN
INTRAVENOUS | Status: DC
  Administered 2016-01-28 (×2): via INTRAVENOUS

## 2016-01-28 NOTE — Evaluation (Signed)
Clinical/Bedside Swallow Evaluation Patient Details  Name: Tiffany [name] MRN: [id number] Date of Birth: 16-Oct-1929  Today's Date: 01/28/2016 Time: SLP Start Time (ACUTE ONLY): 1412 SLP Stop Time (ACUTE ONLY): 1430 SLP Time Calculation (min) (ACUTE ONLY): 18 min  Past Medical History:  Past Medical History  Diagnosis Date  . Hypertension   . Arthritis   . Dementia   . Allergic rhinitis   . GI bleed   . Raynaud's disease   . GERD (gastroesophageal reflux disease)   . Acute respiratory failure ([place])   . Pleural effusion   . ILD (interstitial lung disease) ([place])   . Pulmonary edema   . Chronic diastolic (congestive) heart failure ([place])   . Chronic systolic heart failure ([place])   . A-fib [facility])    Past Surgical History:  Past Surgical History  Procedure Laterality Date  . Back surgery  09/25/2011  . Abdominal hysterectomy    . Esophagogastroduodenoscopy N/A 03/01/2015    Procedure: ESOPHAGOGASTRODUODENOSCOPY (EGD);  Surgeon: Arta Silence, MD;  Location: [facility] END[facility];  Service: Endoscopy;  Laterality: N/A;  . Esophagogastroduodenoscopy N/A 03/02/2015    Procedure: ESOPHAGOGASTRODUODENOSCOPY (EGD);  Surgeon: Arta Silence, MD;  Location: [facility] ENDOSCOPY;  Service: Endoscopy;  Laterality: N/A;  bedside  . Esophagogastroduodenoscopy (egd) with propofol N/A 06/25/2015    Procedure: ESOPHAGOGASTRODUODENOSCOPY (EGD) WITH PROPOFOL;  Surgeon: Clarene Essex, MD;  Location: [facility] ENDOSCOPY;  Service: Endoscopy;  Laterality: N/A;  . Colonoscopy with propofol N/A 06/27/2015    Procedure: COLONOSCOPY WITH PROPOFOL;  Surgeon: Arta Silence, MD;  Location: [facility] ENDOSCOPY;  Service: Endoscopy;  Laterality: N/A;  . Givens capsule study N/A 06/27/2015    Procedure: GIVENS CAPSULE STUDY;  Surgeon: Arta Silence, MD;  Location: [facility] ENDOSCOPY;  Service: Endoscopy;  Laterality: N/A;   HPI:  Pt is an 82 y.o. female with PMH of hypertension, chronic systolic heart failure, atrial fibrillation, arthritis, dementia  (on Namenda), seasonal allergies, Raynaud's disease, GERD, prior GIB in 02/2015 thought to be related to a Dieulafoy lesion and persistent anemia s/p colonoscopy / capsule endoscopy that were unrevealing for anemia source who presented to [facility] on 2/17 after a near syncopal episode and vomiting up blood.Was intubated upon admission 2/17, extubated this a.m. CXR 2/18 post-extubation showed interval mild pulmonary vascular congestion with minimal interstitial pulmonary edema. Since extubation pt has had frequent hemoptysis mucous/ blood mix. Bedside swallow eval ordered to assess swallow function post-extubation. EGD showed possible esophageal varices/ multiple bands.    Assessment / Plan / Recommendation Clinical Impression  Pt showed no overt s/s of aspiration at bedside; subjectively, swallow appeared timely on trials of thin and puree consistencies. Given esophageal varices/ recent banding felt that it would be best to give a little more time before attempting trials of solids. Pt coughed up small amount of bloody sputum prior to initiating evaluation; RN reported that this has been occurring since this a.m following extubation and appears to be improving. Recommend initiating full liquid diet with thin liquids, meds crushed in puree, full supervision to cue for small bites and sips, have pt sit upright 30 minutes after meal. Will f/u x1 to check readiness for solid consistencies.     Aspiration Risk  Mild aspiration risk    Diet Recommendation Thin liquid   Liquid Administration via: Cup;Straw Medication Administration: Crushed with puree Supervision: Patient able to self feed;Full supervision/cueing for compensatory strategies Compensations: Slow rate;Small sips/bites Postural Changes: Seated upright at 90 degrees;Remain upright for at least 30 minutes after po intake  Other  Recommendations Oral Care Recommendations: Oral care BID Other Recommendations: Have oral suction available   Follow up  Recommendations  Other (comment) (TBD)    Frequency and Duration min 1 x/week  1 week       Prognosis Prognosis for Safe Diet Advancement: Good      Swallow Study   General HPI: Pt is an 82 y.o. female with PMH of hypertension, chronic systolic heart failure, atrial fibrillation, arthritis, dementia (on Namenda), seasonal allergies, Raynaud's disease, GERD, prior GIB in 02/2015 thought to be related to a Dieulafoy lesion and persistent anemia s/p colonoscopy / capsule endoscopy that were unrevealing for anemia source who presented to [facility] on 2/17 after a near syncopal episode and vomiting up blood.Was intubated upon admission 2/17, extubated this a.m. CXR 2/18 post-extubation showed interval mild pulmonary vascular congestion with minimal interstitial pulmonary edema. Since extubation pt has had frequent hemoptysis mucous/ blood mix. Bedside swallow eval ordered to assess swallow function post-extubation. EGD showed possible esophageal varices/ multiple bands.  Type of Study: Bedside Swallow Evaluation Diet Prior to this Study: NPO Temperature Spikes Noted: Yes Respiratory Status: Nasal cannula History of Recent Intubation: Yes Length of Intubations (days): 1 days Date extubated: 01/28/16 Behavior/Cognition: Alert;Cooperative;Pleasant mood Oral Cavity Assessment: Within Functional Limits Oral Care Completed by SLP: Yes Oral Cavity - Dentition: Adequate natural dentition Vision: Functional for self-feeding Self-Feeding Abilities: Able to feed self Patient Positioning: Upright in bed Baseline Vocal Quality: Normal Volitional Cough: Strong Volitional Swallow: Able to elicit    Oral/Motor/Sensory Function Overall Oral Motor/Sensory Function: Within functional limits   Ice Chips Ice chips: Not tested   Thin Liquid Thin Liquid: Within functional limits Presentation: Cup;Straw    Nectar Thick Nectar Thick Liquid: Not tested   Honey Thick Honey Thick Liquid: Not tested   Puree Puree:  Within functional limits Presentation: Self Fed;Spoon   Solid   GO   Solid: Not tested        Gilmore Laroche, Amy K, MA, CCC-SLP 01/28/2016,2:38 PM

## 2016-01-28 NOTE — Progress Notes (Signed)
Utilization review completed.  

## 2016-01-28 NOTE — Consult Note (Signed)
PULMONARY / CRITICAL CARE MEDICINE   Name: Tiffany [name] MRN: [id number] DOB: July 08, 1929    ADMISSION DATE:  01/27/2016 CONSULTATION DATE:  01/27/16  REFERRING MD:  Dr. Amedeo Plenty  CHIEF COMPLAINT:  GIB, Intubated for Airway Protection  HISTORY OF PRESENT ILLNESS:  82 year old female with a past medical history of hypertension, chronic systolic heart failure, atrial fibrillation, arthritis, dementia (on Namenda), seasonal allergies, Raynaud's disease, GERD, prior GIB in 02/2015 thought to be related to a Dieulafoy lesion and persistent anemia s/p colonoscopy / capsule endoscopy that were unrevealing for anemia source who presented to [facility] on 2/17 after a near syncopal episode and vomiting up blood.    At baseline the patient lives at home with her spouse in a 1 level home. She continues to drive and goes to the [facility] for exercise. She is able to bathe / feed herself, some shopping and light cooking.  Had a caretaker for approximately 6 weeks early in 2016 after hospitalization.  After the 02/2015 admission she was seen by Dr. Lenna Gilford was able to come off oxygen after discharge. She has been very cautious to avoid caustic medications.  Family reports she was a social drinker (never heavy) but has not drank in the last 5 years.  Patient was in her usual state of health until 6:30 AM on 2/17 at which time she vomited up blood. Initially they were unsure that it was blood however a second episode had multiple clots. She also had a near syncopal episode. She denied LOC on admission. On presentation she denied black tarry or bloody stools.  GI was consulted in the emergency room and the patient taken to endoscopy for EGD. Notes reflect esophagus and proximal stomach were full of blood & clots in the fundus.  There also appeared to be a distal esophageal varix bleeding and multiple bands were placed. Estimated blood loss approximately 500 ML's per endoscopy staff. The patient was returned to ICU intubated.    PCCM  consulted for evaluation.  SUBJECTIVE:    VITAL SIGNS: BP 107/42 mmHg  Pulse 79  Temp(Src) 99 F (37.2 C) (Core (Comment))  Resp 21  Ht 5\' 1"  (1.549 m)  Wt 69 kg (152 lb 1.9 oz)  BMI 28.76 kg/m2  SpO2 100%  HEMODYNAMICS:    VENTILATOR SETTINGS: Vent Mode:  [-] PRVC FiO2 (%):  [40 %-100 %] 40 % Set Rate:  [18 bmp] 18 bmp Vt Set:  [380 mL-500 mL] 380 mL PEEP:  [5 cmH20] 5 cmH20 Plateau Pressure:  [14 cmH20-18 cmH20] 14 cmH20  INTAKE / OUTPUT: I/O last 3 completed shifts: In: 4346.1 [I.V.:3581.1; Blood:665; IV Piggyback:100] Out: 505 [Urine:505]  PHYSICAL EXAMINATION: General:  Elderly female in NAD on vent  Neuro:  Sedated, moves all extremities spontaneously  HEENT:  ETT, bloody secretions from mouth, small amount in vent tubing Cardiovascular:  s1s2 rrr, no m/r/g Lungs:  Even/non-labored, lungs bilaterally clear Abdomen:  Soft, non-distented Musculoskeletal:  No acute deformities  Skin:  Warm/dry, no edema   LABS:  BMET  Recent Labs Lab 01/27/16 1124 01/28/16 0510  NA 145 150*  K 4.4 4.2  CL 109 118*  CO2 25 23  BUN 40* 43*  CREATININE 1.18* 1.06*  GLUCOSE 151* 140*    Electrolytes  Recent Labs Lab 01/27/16 1124 01/28/16 0510  CALCIUM 8.7* 8.6*  MG  --  1.9  PHOS  --  4.7*    CBC  Recent Labs Lab 01/27/16 1124 01/27/16 1956 01/28/16 0510  WBC 10.6* 13.5*  11.6*  HGB 10.7* 12.9 11.6*  HCT 34.5* 39.1 36.0  PLT 111* 84* 78*    Coag's  Recent Labs Lab 01/27/16 1124  INR 1.25    Sepsis Markers  Recent Labs Lab 01/27/16 1655 01/28/16 0510  PROCALCITON <0.10 <0.10    ABG  Recent Labs Lab 01/27/16 1757 01/28/16 0405  PHART 7.432 7.348*  PCO2ART 30.4* 40.1  PO2ART 340* 89.3    Liver Enzymes  Recent Labs Lab 01/27/16 1124  AST 23  ALT 14  ALKPHOS 103  BILITOT 0.8  ALBUMIN 3.7    Cardiac Enzymes  Recent Labs Lab 01/27/16 1655 01/27/16 2235 01/28/16 0510  TROPONINI 0.09* 0.16* 0.08*    Glucose No  results for input(s): GLUCAP in the last 168 hours.  Imaging Dg Chest 1 View  01/27/2016  CLINICAL DATA:  Acute respiratory arrest with intubation. Current history of hypertension, atrial fibrillation, chronic systolic and diastolic heart failure. EXAM: Portable CHEST 1 VIEW 3:11 p.m.: COMPARISON:  06/23/2015 and earlier. FINDINGS: Endotracheal tube tip is at the carina. Cardiac silhouette mildly to moderately enlarged, unchanged. Dense consolidation in the left lower lobe silhouetting the left hemidiaphragm, associated with a possible left pleural effusion. Pulmonary vascularity normal without evidence of pulmonary edema. Lungs otherwise clear. No right pleural effusion. IMPRESSION: 1. Endotracheal tube tip is at the carina. It should be withdrawn approximately 4-5 cm. 2. Left lower lobe pneumonia with a possible left parapneumonic effusion. 3. Stable mild to moderate cardiomegaly without pulmonary edema. I telephoned these results at the time of interpretation on 01/27/2016 at 3:36 pm to Lattie Haw, the nurse caring for the patient in the ICU, who verbally acknowledged these results. Electronically Signed   By: Evangeline Dakin M.D.   On: 01/27/2016 15:37   Dg Chest Port 1 View  01/27/2016  CLINICAL DATA:  Central line placement. EXAM: PORTABLE CHEST 1 VIEW COMPARISON:  01/27/2016 at 1511 hours FINDINGS: The endotracheal tube has been withdrawn. Tip now projects 3.5 cm above the carina. Right internal jugular central venous line tip projects the lower superior vena cava. No pneumothorax Left lung base consolidation is without change previous exam. No new lung abnormalities. IMPRESSION: 1. Endotracheal tube now well positioned with its tip 3.5 cm above the carina. 2. New right internal jugular central venous line tip lies in the lower superior vena cava. No pneumothorax. Electronically Signed   By: Lajean Manes M.D.   On: 01/27/2016 16:55   US Abdomen Limited Ruq  01/27/2016  CLINICAL DATA:  81 year old female  with nausea and vomiting. EXAM: US ABDOMEN LIMITED - RIGHT UPPER QUADRANT COMPARISON:  CT dated 03/01/2015 FINDINGS: Gallbladder: There is diffuse edematous appearance and thickening of the gallbladder wall. The gallbladder wall measures approximately 11 mm in thickness. There is no gallstone or pericholecystic fluid. Evaluation for Murphy's sign was limited as the patient is vented. Common bile duct: Diameter: 4 mm. Liver: The liver demonstrates a heterogeneous echotexture. IMPRESSION: Diffuse thickening of the gallbladder wall may be related to underlying cirrhosis or represent chronic cholecystitis. An acalculous cholecystitis is not excluded. Clinical correlation is recommended. Heterogeneous liver secondary to underlying cirrhosis. Electronically Signed   By: Anner Crete M.D.   On: 01/27/2016 18:58     STUDIES:  2/17 EGD >> ? esophageal varices, multiple bands  CULTURES:   ANTIBIOTICS: Vanco 2/17 >>  Zosyn 2/17 >>   SIGNIFICANT EVENTS: 2/17  Admit with hematemesis   LINES/TUBES: ETT 2/17 >>  R IJ TLC 2/17 >>   DISCUSSION: 82  y/o F with PMH of Dieulafoy lesion, mild dementia, AF, chronic sCHF, & HTN admitted with hematemesis thought to be related to esophageal varices (not present on prior EGD < 1 year ago).  Returned to ICU on vent for airway protection.    ASSESSMENT / PLAN:  GASTROINTESTINAL A:   Acute GIB - s/p EGD 2/17 per Dr. Amedeo Plenty with esophageal varices  Hx of Dieulafoy Lesion Evidence for Cirrhosis on RUQ [place] 2/17 Possible chronic cholecystitis vs changes associated with cirrhosis.  P:   Monitor for further bleeding Will obtain CT Imaging of ABD once stabilized  Protonix gtt Octreotide gtt  NPO NO OGT with multiple bands Check hepatitis panel, esp for chronic hep C  PULMONARY A: Intubated for Airway Protection  Possible Aspiration in setting of Hematemesis  LLL Airspace Disease / CAP - infiltrate +/- effusion  Hx Hypoxic Respiratory Failure - after prior  02/2015 admission for GIB, able to come off O2.  Works out at [employer]. P P:   MV support, 8cc/kg Wean PEEP / FiO2 for sats > 92% Intermittent CXR  See ID   CARDIOVASCULAR A:  Hx HTN, PAF, Chronic sCHF  P:  Monitor hemodynamics in ICU Hold home Toprol, lasix / KCL for now PRN lopressor for SBP > 180 DNR in the event of cardiac arrest Assess EKG, troponin   RENAL A:   At Risk AKI - in setting of GIB  Hypernatremia  P:   Trend BMP / UOP  Replace electrolytes as indicated  Add free water > d5w (avoiding GT)  HEMATOLOGIC A:   Anemia - persistent since 02/2015, prior negative colonoscopy and capsule endoscopy.   P:  Trend CBC  Repeat H/H post transfusion  SCD's for DVT prophylaxis   INFECTIOUS A:   Possible CAP + Aspiration - LLL airspace disease on initial CXR  P:   Monitor fever curve / WBC Trend PCT  ABX as above  ENDOCRINE A:   At Risk Hypoglycemia  P:   Trend glucose on BMP  NEUROLOGIC A:   ICU associated pain / discomfort  Hx Dementia  P:   RASS goal: -1 Propofol for sedation  PRN fentanyl for pain  Hold home Namenda, remeron   FAMILY  - Updates:  Family updated at bedside.  Goals of care discussed - continue current medical therapies.  Hopeful to stabilize and return to baseline.  In the event of cardiac arrest, no heroic measures.    - Inter-disciplinary family meet or Palliative Care meeting due by:  Ongoing  Independent CC time 35 minutes.   Baltazar Apo, MD, PhD 01/28/2016, 7:56 AM [facility] [phone] or if no answer [phone]

## 2016-01-28 NOTE — Progress Notes (Signed)
[facility] Progress Note  Subjective: No further hematemesis since her EGD with variceal banding.Hemoglobin stable. Se was extubated this morning.  Objective: Vital signs in last 24 hours: Temp:  [97.4 F (36.3 C)-99.9 F (37.7 C)] 99.1 F (37.3 C) (02/18 0957) Pulse Rate:  [77-92] 87 (02/18 0957) Resp:  [15-26] 15 (02/18 0957) BP: (105-173)/(42-96) 159/63 mmHg (02/18 0900) SpO2:  [92 %-100 %] 92 % (02/18 0957) FiO2 (%):  [40 %-100 %] 40 % (02/18 0759) Weight:  [63.504 kg (140 lb)-69 kg (152 lb 1.9 oz)] 69 kg (152 lb 1.9 oz) (02/18 0424) Weight change:    PE:  No distress  Heart regular rhythm  Lungs clear  Abdomen soft nontender  Lab Results: Results for orders placed or performed during the hospital encounter of 01/27/16 (from the past 24 hour(s))  Comprehensive metabolic panel     Status: Abnormal   Collection Time: 01/27/16 11:24 AM  Result Value Ref Range   Sodium 145 135 - 145 mmol/L   Potassium 4.4 3.5 - 5.1 mmol/L   Chloride 109 101 - 111 mmol/L   CO2 25 22 - 32 mmol/L   Glucose, Bld 151 (H) 65 - 99 mg/dL   BUN 40 (H) 6 - 20 mg/dL   Creatinine, Ser 1.18 (H) 0.44 - 1.00 mg/dL   Calcium 8.7 (L) 8.9 - 10.3 mg/dL   Total Protein 6.0 (L) 6.5 - 8.1 g/dL   Albumin 3.7 3.5 - 5.0 g/dL   AST 23 15 - 41 U/L   ALT 14 14 - 54 U/L   Alkaline Phosphatase 103 38 - 126 U/L   Total Bilirubin 0.8 0.3 - 1.2 mg/dL   GFR calc non Af Amer 41 (L) >60 mL/min   GFR calc Af Amer 48 (L) >60 mL/min   Anion gap 11 5 - 15  Lipase, blood     Status: None   Collection Time: 01/27/16 11:24 AM  Result Value Ref Range   Lipase 31 11 - 51 U/L  Troponin I     Status: None   Collection Time: 01/27/16 11:24 AM  Result Value Ref Range   Troponin I 0.03 <0.031 ng/mL  Brain natriuretic peptide     Status: None   Collection Time: 01/27/16 11:24 AM  Result Value Ref Range   B Natriuretic Peptide 75.5 0.0 - 100.0 pg/mL  CBC with Differential     Status: Abnormal   Collection  Time: 01/27/16 11:24 AM  Result Value Ref Range   WBC 10.6 (H) 4.0 - 10.5 K/uL   RBC 3.69 (L) 3.87 - 5.11 MIL/uL   Hemoglobin 10.7 (L) 12.0 - 15.0 g/dL   HCT 34.5 (L) 36.0 - 46.0 %   MCV 93.5 78.0 - 100.0 fL   MCH 29.0 26.0 - 34.0 pg   MCHC 31.0 30.0 - 36.0 g/dL   RDW 14.1 11.5 - 15.5 %   Platelets 111 (L) 150 - 400 K/uL   Neutrophils Relative % 88 %   Neutro Abs 9.3 (H) 1.7 - 7.7 K/uL   Lymphocytes Relative 9 %   Lymphs Abs 0.9 0.7 - 4.0 K/uL   Monocytes Relative 3 %   Monocytes Absolute 0.3 0.1 - 1.0 K/uL   Eosinophils Relative 1 %   Eosinophils Absolute 0.1 0.0 - 0.7 K/uL   Basophils Relative 0 %   Basophils Absolute 0.0 0.0 - 0.1 K/uL  Protime-INR     Status: Abnormal   Collection Time: 01/27/16 11:24 AM  Result Value Ref Range  Prothrombin Time 15.8 (H) 11.6 - 15.2 seconds   INR 1.25 0.00 - 1.49  Type and screen [place]     Status: None (Preliminary result)   Collection Time: 01/27/16 11:24 AM  Result Value Ref Range   ABO/RH(D) B NEG    Antibody Screen NEG    Sample Expiration 01/30/2016    Unit Number [id number]    Blood Component Type RED CELLS,LR    Unit division 00    Status of Unit ISSUED    Transfusion Status OK TO TRANSFUSE    Crossmatch Result Compatible    Unit Number [id number]    Blood Component Type RBC LR PHER2    Unit division 00    Status of Unit ISSUED    Transfusion Status OK TO TRANSFUSE    Crossmatch Result Compatible    Unit Number [id number]    Blood Component Type RBC LR PHER2    Unit division 00    Status of Unit ALLOCATED    Transfusion Status OK TO TRANSFUSE    Crossmatch Result Compatible   POC occult blood, ED Provider will collect     Status: None   Collection Time: 01/27/16 12:25 PM  Result Value Ref Range   Fecal Occult Bld NEGATIVE NEGATIVE  Prepare RBC     Status: None   Collection Time: 01/27/16  2:12 PM  Result Value Ref Range   Order Confirmation ORDER PROCESSED BY BLOOD BANK   Prepare  RBC     Status: None   Collection Time: 01/27/16  3:00 PM  Result Value Ref Range   Order Confirmation ORDER PROCESSED BY BLOOD BANK   MRSA PCR Screening     Status: Abnormal   Collection Time: 01/27/16  3:13 PM  Result Value Ref Range   MRSA by PCR POSITIVE (A) NEGATIVE  Triglycerides     Status: Abnormal   Collection Time: 01/27/16  4:55 PM  Result Value Ref Range   Triglycerides 226 (H) <150 mg/dL  Procalcitonin - Baseline     Status: None   Collection Time: 01/27/16  4:55 PM  Result Value Ref Range   Procalcitonin <0.10 ng/mL  Troponin I     Status: Abnormal   Collection Time: 01/27/16  4:55 PM  Result Value Ref Range   Troponin I 0.09 (H) <0.031 ng/mL  Blood gas, arterial     Status: Abnormal   Collection Time: 01/27/16  5:57 PM  Result Value Ref Range   FIO2 1.00    Delivery systems VENTILATOR    Mode PRESSURE REGULATED VOLUME CONTROL    VT 500 mL   LHR 18 resp/min   Peep/cpap 5.0 cm H20   pH, Arterial 7.432 7.350 - 7.450   pCO2 arterial 30.4 (L) 35.0 - 45.0 mmHg   pO2, Arterial 340 (H) 80.0 - 100.0 mmHg   Bicarbonate 19.9 (L) 20.0 - 24.0 mEq/L   TCO2 17.7 0 - 100 mmol/L   Acid-base deficit 2.9 (H) 0.0 - 2.0 mmol/L   O2 Saturation 99.8 %   Patient temperature 98.6    Collection site RIGHT RADIAL    Drawn by COLLECTED BY RT    Sample type ARTERIAL DRAW    Allens test (pass/fail) PASS PASS  Urinalysis, Routine w reflex microscopic     Status: None   Collection Time: 01/27/16  5:58 PM  Result Value Ref Range   Color, Urine YELLOW YELLOW   APPearance CLEAR CLEAR   Specific Gravity, Urine 1.026 1.005 - 1.030  pH 6.0 5.0 - 8.0   Glucose, UA NEGATIVE NEGATIVE mg/dL   Hgb urine dipstick NEGATIVE NEGATIVE   Bilirubin Urine NEGATIVE NEGATIVE   Ketones, ur NEGATIVE NEGATIVE mg/dL   Protein, ur NEGATIVE NEGATIVE mg/dL   Nitrite NEGATIVE NEGATIVE   Leukocytes, UA NEGATIVE NEGATIVE  CBC     Status: Abnormal   Collection Time: 01/27/16  7:56 PM  Result Value Ref Range    WBC 13.5 (H) 4.0 - 10.5 K/uL   RBC 4.18 3.87 - 5.11 MIL/uL   Hemoglobin 12.9 12.0 - 15.0 g/dL   HCT 39.1 36.0 - 46.0 %   MCV 93.5 78.0 - 100.0 fL   MCH 30.9 26.0 - 34.0 pg   MCHC 33.0 30.0 - 36.0 g/dL   RDW 15.5 11.5 - 15.5 %   Platelets 84 (L) 150 - 400 K/uL  Troponin I     Status: Abnormal   Collection Time: 01/27/16 10:35 PM  Result Value Ref Range   Troponin I 0.16 (H) <0.031 ng/mL  Blood gas, arterial     Status: Abnormal   Collection Time: 01/28/16  4:05 AM  Result Value Ref Range   FIO2 0.40    Delivery systems VENTILATOR    Mode PRESSURE REGULATED VOLUME CONTROL    VT 380 mL   LHR 18 resp/min   Peep/cpap 5.0 cm H20   pH, Arterial 7.348 (L) 7.350 - 7.450   pCO2 arterial 40.1 35.0 - 45.0 mmHg   pO2, Arterial 89.3 80.0 - 100.0 mmHg   Bicarbonate 21.5 20.0 - 24.0 mEq/L   TCO2 19.6 0 - 100 mmol/L   Acid-base deficit 3.3 (H) 0.0 - 2.0 mmol/L   O2 Saturation 95.9 %   Patient temperature 98.6    Collection site RIGHT RADIAL    Drawn by [phone]    Sample type ARTERIAL DRAW    Allens test (pass/fail) PASS PASS  Troponin I     Status: Abnormal   Collection Time: 01/28/16  5:10 AM  Result Value Ref Range   Troponin I 0.08 (H) <0.031 ng/mL  CBC     Status: Abnormal   Collection Time: 01/28/16  5:10 AM  Result Value Ref Range   WBC 11.6 (H) 4.0 - 10.5 K/uL   RBC 3.81 (L) 3.87 - 5.11 MIL/uL   Hemoglobin 11.6 (L) 12.0 - 15.0 g/dL   HCT 36.0 36.0 - 46.0 %   MCV 94.5 78.0 - 100.0 fL   MCH 30.4 26.0 - 34.0 pg   MCHC 32.2 30.0 - 36.0 g/dL   RDW 15.9 (H) 11.5 - 15.5 %   Platelets 78 (L) 150 - 400 K/uL  Basic metabolic panel     Status: Abnormal   Collection Time: 01/28/16  5:10 AM  Result Value Ref Range   Sodium 150 (H) 135 - 145 mmol/L   Potassium 4.2 3.5 - 5.1 mmol/L   Chloride 118 (H) 101 - 111 mmol/L   CO2 23 22 - 32 mmol/L   Glucose, Bld 140 (H) 65 - 99 mg/dL   BUN 43 (H) 6 - 20 mg/dL   Creatinine, Ser 1.06 (H) 0.44 - 1.00 mg/dL   Calcium 8.6 (L) 8.9 - 10.3 mg/dL    GFR calc non Af Amer 47 (L) >60 mL/min   GFR calc Af Amer 54 (L) >60 mL/min   Anion gap 9 5 - 15  Magnesium     Status: None   Collection Time: 01/28/16  5:10 AM  Result Value Ref Range  Magnesium 1.9 1.7 - 2.4 mg/dL  Phosphorus     Status: Abnormal   Collection Time: 01/28/16  5:10 AM  Result Value Ref Range   Phosphorus 4.7 (H) 2.5 - 4.6 mg/dL  Procalcitonin     Status: None   Collection Time: 01/28/16  5:10 AM  Result Value Ref Range   Procalcitonin <0.10 ng/mL  Glucose, capillary     Status: Abnormal   Collection Time: 01/28/16  7:52 AM  Result Value Ref Range   Glucose-Capillary 130 (H) 65 - 99 mg/dL    Studies/Results: Dg Chest 1 View  01/27/2016  CLINICAL DATA:  Acute respiratory arrest with intubation. Current history of hypertension, atrial fibrillation, chronic systolic and diastolic heart failure. EXAM: Portable CHEST 1 VIEW 3:11 p.m.: COMPARISON:  06/23/2015 and earlier. FINDINGS: Endotracheal tube tip is at the carina. Cardiac silhouette mildly to moderately enlarged, unchanged. Dense consolidation in the left lower lobe silhouetting the left hemidiaphragm, associated with a possible left pleural effusion. Pulmonary vascularity normal without evidence of pulmonary edema. Lungs otherwise clear. No right pleural effusion. IMPRESSION: 1. Endotracheal tube tip is at the carina. It should be withdrawn approximately 4-5 cm. 2. Left lower lobe pneumonia with a possible left parapneumonic effusion. 3. Stable mild to moderate cardiomegaly without pulmonary edema. I telephoned these results at the time of interpretation on 01/27/2016 at 3:36 pm to Lattie Haw, the nurse caring for the patient in the ICU, who verbally acknowledged these results. Electronically Signed   By: Evangeline Dakin M.D.   On: 01/27/2016 15:37   Dg Chest Port 1 View  01/28/2016  CLINICAL DATA:  Status post extubation.  Near syncope 1 day ago. EXAM: PORTABLE CHEST 1 VIEW COMPARISON:  Earlier today. FINDINGS: The  endotracheal tube has been removed. Stable right jugular catheter. The cardiac silhouette remains borderline enlarged. Stable oval calcification overlying the heart and left lower lung zone. Minimal increase in prominence of the pulmonary vasculature and interstitial markings. Old, healed right rib fractures. Thoracolumbar spine degenerative changes. IMPRESSION: 1. Interval mild pulmonary vascular congestion and minimal interstitial pulmonary edema. 2. Stable borderline cardiomegaly. Electronically Signed   By: Claudie Revering M.D.   On: 01/28/2016 10:29   Dg Chest Port 1 View  01/28/2016  CLINICAL DATA:  Acute respiratory failure EXAM: PORTABLE CHEST 1 VIEW COMPARISON:  01/27/2016 FINDINGS: Right jugular venous catheter and endotracheal tube are stable. Left basilar consolidation has improved. Right basilar atelectasis has increased with lower lung volumes. No pneumothorax. IMPRESSION: Improved left basilar consolidation. Increasing right basilar atelectasis. Electronically Signed   By: Marybelle Killings M.D.   On: 01/28/2016 08:17   Dg Chest Port 1 View  01/27/2016  CLINICAL DATA:  Central line placement. EXAM: PORTABLE CHEST 1 VIEW COMPARISON:  01/27/2016 at 1511 hours FINDINGS: The endotracheal tube has been withdrawn. Tip now projects 3.5 cm above the carina. Right internal jugular central venous line tip projects the lower superior vena cava. No pneumothorax Left lung base consolidation is without change previous exam. No new lung abnormalities. IMPRESSION: 1. Endotracheal tube now well positioned with its tip 3.5 cm above the carina. 2. New right internal jugular central venous line tip lies in the lower superior vena cava. No pneumothorax. Electronically Signed   By: Lajean Manes M.D.   On: 01/27/2016 16:55   US Abdomen Limited Ruq  01/27/2016  CLINICAL DATA:  82 year old female with nausea and vomiting. EXAM: US ABDOMEN LIMITED - RIGHT UPPER QUADRANT COMPARISON:  CT dated 03/01/2015 FINDINGS: Gallbladder:  There is  diffuse edematous appearance and thickening of the gallbladder wall. The gallbladder wall measures approximately 11 mm in thickness. There is no gallstone or pericholecystic fluid. Evaluation for Murphy's sign was limited as the patient is vented. Common bile duct: Diameter: 4 mm. Liver: The liver demonstrates a heterogeneous echotexture. IMPRESSION: Diffuse thickening of the gallbladder wall may be related to underlying cirrhosis or represent chronic cholecystitis. An acalculous cholecystitis is not excluded. Clinical correlation is recommended. Heterogeneous liver secondary to underlying cirrhosis. Electronically Signed   By: Anner Crete M.D.   On: 01/27/2016 18:58      Assessment: Esophgeal variceal bleed status post banding  Plan:   Continue octreotide drip. Watch for further signs of bleeding   Cassell Clement 01/28/2016, 10:52 AM  Pager: [phone] If no answer or after 5 PM call [phone]

## 2016-01-28 NOTE — Progress Notes (Signed)
Pt having frequent hemoptysis mucus/dark blood mix since extubation.  Informed Dr. Lamonte Sakai.  Irven Baltimore, RN

## 2016-01-29 DIAGNOSIS — J690 Pneumonitis due to inhalation of food and vomit: Secondary | ICD-10-CM

## 2016-01-29 LAB — CBC
HEMATOCRIT: 29 % — AB (ref 36.0–46.0)
HEMATOCRIT: 29.3 % — AB (ref 36.0–46.0)
HEMOGLOBIN: 9.4 g/dL — AB (ref 12.0–15.0)
Hemoglobin: 9.7 g/dL — ABNORMAL LOW (ref 12.0–15.0)
MCH: 30.8 pg (ref 26.0–34.0)
MCH: 31 pg (ref 26.0–34.0)
MCHC: 32.4 g/dL (ref 30.0–36.0)
MCHC: 33.1 g/dL (ref 30.0–36.0)
MCV: 95.1 fL (ref 78.0–100.0)
MCV: 93.6 fL (ref 78.0–100.0)
PLATELETS: 60 10*3/uL — AB (ref 150–400)
Platelets: 58 10*3/uL — ABNORMAL LOW (ref 150–400)
RBC: 3.05 MIL/uL — AB (ref 3.87–5.11)
RBC: 3.13 MIL/uL — ABNORMAL LOW (ref 3.87–5.11)
RDW: 16.1 % — ABNORMAL HIGH (ref 11.5–15.5)
RDW: 16 % — AB (ref 11.5–15.5)
WBC: 8.3 10*3/uL (ref 4.0–10.5)
WBC: 6.9 10*3/uL (ref 4.0–10.5)

## 2016-01-29 LAB — BASIC METABOLIC PANEL
ANION GAP: 6 (ref 5–15)
BUN: 33 mg/dL — ABNORMAL HIGH (ref 6–20)
CHLORIDE: 113 mmol/L — AB (ref 101–111)
CO2: 24 mmol/L (ref 22–32)
Calcium: 8.1 mg/dL — ABNORMAL LOW (ref 8.9–10.3)
Creatinine, Ser: 0.84 mg/dL (ref 0.44–1.00)
GFR calc non Af Amer: >60 mL/min (ref >60)
GLUCOSE: 124 mg/dL — AB (ref 65–99)
POTASSIUM: 3.5 mmol/L (ref 3.5–5.1)
Sodium: 143 mmol/L (ref 135–145)

## 2016-01-29 LAB — PROCALCITONIN: Procalcitonin: <0.1 ng/mL

## 2016-01-29 LAB — HEPATITIS PANEL, ACUTE
HEP B S AG: NEGATIVE
Hep A IgM: NEGATIVE
Hep B C IgM: NEGATIVE

## 2016-01-29 MED ORDER — PANTOPRAZOLE SODIUM 40 MG IV SOLR
40.0000 mg | INTRAVENOUS | Status: DC
  Administered 2016-01-29 – 2016-01-30 (×2): 40 mg via INTRAVENOUS
  Filled 2016-01-29 (×2): qty 40

## 2016-01-29 MED ORDER — CETYLPYRIDINIUM CHLORIDE 0.05 % MT LIQD
7.0000 mL | Freq: Two times a day (BID) | OROMUCOSAL | Status: DC
  Administered 2016-01-29 – 2016-01-30 (×4): 7 mL via OROMUCOSAL

## 2016-01-29 MED ORDER — POTASSIUM CHLORIDE 10 MEQ/50ML IV SOLN
10.0000 meq | INTRAVENOUS | Status: AC
  Administered 2016-01-29 (×2): 10 meq via INTRAVENOUS
  Filled 2016-01-29 (×2): qty 50

## 2016-01-29 MED ORDER — CHLORHEXIDINE GLUCONATE 0.12 % MT SOLN
15.0000 mL | Freq: Two times a day (BID) | OROMUCOSAL | Status: DC
  Administered 2016-01-29 – 2016-01-30 (×4): 15 mL via OROMUCOSAL
  Filled 2016-01-29 (×3): qty 15

## 2016-01-29 MED ORDER — DEXTROSE-NACL 5-0.45 % IV SOLN
INTRAVENOUS | Status: DC
  Administered 2016-01-29 – 2016-01-31 (×4): via INTRAVENOUS

## 2016-01-29 NOTE — Progress Notes (Signed)
[facility] Progress Note  Subjective: No further signs of GI bleeding since endoscopy with variceal banding.  Objective: Vital signs in last 24 hours: Temp:  [98.1 F (36.7 C)-100.2 F (37.9 C)] 98.4 F (36.9 C) (02/19 0800) Pulse Rate:  [79-99] 82 (02/19 0500) Resp:  [15-24] 15 (02/19 0500) BP: (119-157)/(32-58) 131/58 mmHg (02/19 0500) SpO2:  [93 %-99 %] 96 % (02/19 0500) Weight:  [69.6 kg (153 lb 7 oz)] 69.6 kg (153 lb 7 oz) (02/19 0441) Weight change: 6.096 kg (13 lb 7 oz)   PE:  She is in no distress  Abdomen soft and nontender  Lab Results: Results for orders placed or performed during the hospital encounter of 01/27/16 (from the past 24 hour(s))  Glucose, capillary     Status: None   Collection Time: 01/28/16 11:47 AM  Result Value Ref Range   Glucose-Capillary 73 65 - 99 mg/dL  Glucose, capillary     Status: Abnormal   Collection Time: 01/28/16  4:57 PM  Result Value Ref Range   Glucose-Capillary 174 (H) 65 - 99 mg/dL  CBC     Status: Abnormal   Collection Time: 01/28/16  5:45 PM  Result Value Ref Range   WBC 17.7 (H) 4.0 - 10.5 K/uL   RBC 3.42 (L) 3.87 - 5.11 MIL/uL   Hemoglobin 10.6 (L) 12.0 - 15.0 g/dL   HCT 31.8 (L) 36.0 - 46.0 %   MCV 93.0 78.0 - 100.0 fL   MCH 31.0 26.0 - 34.0 pg   MCHC 33.3 30.0 - 36.0 g/dL   RDW 15.9 (H) 11.5 - 15.5 %   Platelets 91 (L) 150 - 400 K/uL  Procalcitonin     Status: None   Collection Time: 01/29/16  4:53 AM  Result Value Ref Range   Procalcitonin <0.10 ng/mL  Basic metabolic panel     Status: Abnormal   Collection Time: 01/29/16  4:53 AM  Result Value Ref Range   Sodium 143 135 - 145 mmol/L   Potassium 3.5 3.5 - 5.1 mmol/L   Chloride 113 (H) 101 - 111 mmol/L   CO2 24 22 - 32 mmol/L   Glucose, Bld 124 (H) 65 - 99 mg/dL   BUN 33 (H) 6 - 20 mg/dL   Creatinine, Ser 0.84 0.44 - 1.00 mg/dL   Calcium 8.1 (L) 8.9 - 10.3 mg/dL   GFR calc non Af Amer >60 >60 mL/min   GFR calc Af Amer >60 >60 mL/min   Anion  gap 6 5 - 15  CBC     Status: Abnormal   Collection Time: 01/29/16  4:53 AM  Result Value Ref Range   WBC 8.3 4.0 - 10.5 K/uL   RBC 3.05 (L) 3.87 - 5.11 MIL/uL   Hemoglobin 9.4 (L) 12.0 - 15.0 g/dL   HCT 29.0 (L) 36.0 - 46.0 %   MCV 95.1 78.0 - 100.0 fL   MCH 30.8 26.0 - 34.0 pg   MCHC 32.4 30.0 - 36.0 g/dL   RDW 16.1 (H) 11.5 - 15.5 %   Platelets 58 (L) 150 - 400 K/uL    Studies/Results: No results found.    Assessment: Variceal bleed  Plan:   She appears to be stable. I think she can be transferred to the floor. Follow clinically and watch for further signs of bleeding. When she is discharged she will lead to follow-up with Dr. Amedeo Plenty.    SAM F GANEM 01/29/2016, 11:33 AM  Pager: [phone] If no answer or after 5  PM call [phone] Lab Results  Component Value Date   HGB 9.4* 01/29/2016   HGB 10.6* 01/28/2016   HGB 11.6* 01/28/2016   HGB 11.7 07/21/2015   HCT 29.0* 01/29/2016   HCT 31.8* 01/28/2016   HCT 36.0 01/28/2016   HCT 38.6 07/21/2015   ALKPHOS 103 01/27/2016   ALKPHOS 113 08/26/2015   ALKPHOS 152* 08/25/2015   ALKPHOS 169* 07/21/2015   AST 23 01/27/2016   AST 20 08/26/2015   AST 31 08/25/2015   AST 32 07/21/2015   ALT 14 01/27/2016   ALT 16 08/26/2015   ALT 21 08/25/2015   ALT 24 07/21/2015

## 2016-01-29 NOTE — Progress Notes (Signed)
[facility] ADULT ICU REPLACEMENT PROTOCOL FOR AM LAB REPLACEMENT ONLY  The patient does apply for the [facility] Adult ICU Electrolyte Replacment Protocol based on the criteria listed below:   1. Is GFR >/= 40 ml/min? Yes.    Patient's GFR today is >60 2. Is urine output >/= 0.5 ml/kg/hr for the last 6 hours? Yes.   Patient's UOP is 2.0 ml/kg/hr 3. Is BUN < 60 mg/dL? Yes.    Patient's BUN today 33 4. Abnormal electrolyte(s): K 3.5 5. Ordered repletion with: per protocol 6. If a panic level lab has been reported, has the CCM MD in charge been notified? No..   Physician:    Ronda Fairly A 01/29/2016 6:09 AM \

## 2016-01-29 NOTE — Progress Notes (Signed)
PULMONARY / CRITICAL CARE MEDICINE   Name: Tiffany [name] MRN: [id number] DOB: Jun 30, 1929    ADMISSION DATE:  01/27/2016 CONSULTATION DATE:  01/27/16  REFERRING MD:  Dr. Amedeo Plenty  CHIEF COMPLAINT:  GIB, Intubated for Airway Protection  HISTORY OF PRESENT ILLNESS:  82 year old female with a past medical history of hypertension, chronic systolic heart failure, atrial fibrillation, arthritis, dementia (on Namenda), seasonal allergies, Raynaud's disease, GERD, prior GIB in 02/2015 thought to be related to a Dieulafoy lesion and persistent anemia s/p colonoscopy / capsule endoscopy that were unrevealing for anemia source who presented to [facility] on 2/17 after a near syncopal episode and vomiting up blood.    At baseline the patient lives at home with her spouse in a 1 level home. She continues to drive and goes to the [facility] for exercise. She is able to bathe / feed herself, some shopping and light cooking.  Had a caretaker for approximately 6 weeks early in 2016 after hospitalization.  After the 02/2015 admission she was seen by Dr. Lenna Gilford was able to come off oxygen after discharge. She has been very cautious to avoid caustic medications.  Family reports she was a social drinker (never heavy) but has not drank in the last 5 years.  Patient was in her usual state of health until 6:30 AM on 2/17 at which time she vomited up blood. Initially they were unsure that it was blood however a second episode had multiple clots. She also had a near syncopal episode. She denied LOC on admission. On presentation she denied black tarry or bloody stools.  GI was consulted in the emergency room and the patient taken to endoscopy for EGD. Notes reflect esophagus and proximal stomach were full of blood & clots in the fundus.  There also appeared to be a distal esophageal varix bleeding and multiple bands were placed. Estimated blood loss approximately 500 ML's per endoscopy staff. The patient was returned to ICU intubated.    PCCM  consulted for evaluation.  SUBJECTIVE:  Did well post-extubation Some blood-tinged sputum Still having black / maroon stools, note Hgb drop  VITAL SIGNS: BP 131/58 mmHg  Pulse 82  Temp(Src) 98.1 F (36.7 C) (Oral)  Resp 15  Ht 5\' 1"  (1.549 m)  Wt 69.6 kg (153 lb 7 oz)  BMI 29.01 kg/m2  SpO2 96%  HEMODYNAMICS:    VENTILATOR SETTINGS: Vent Mode:  [-] PSV FiO2 (%):  [40 %] 40 % PEEP:  [5 cmH20] 5 cmH20 Pressure Support:  [5 cmH20] 5 cmH20  INTAKE / OUTPUT: I/O last 3 completed shifts: In: 5814.8 [I.V.:5049.8; Blood:665; IV Piggyback:100] Out: 905 [Urine:905]  PHYSICAL EXAMINATION: General:  Elderly female in NAD on vent  Neuro:  Sedated, moves all extremities spontaneously  HEENT:  ETT, bloody secretions from mouth, small amount in vent tubing Cardiovascular:  s1s2 rrr, no m/r/g Lungs:  Even/non-labored, lungs bilaterally clear Abdomen:  Soft, non-distented Musculoskeletal:  No acute deformities  Skin:  Warm/dry, no edema   LABS:  BMET  Recent Labs Lab 01/27/16 1124 01/28/16 0510 01/29/16 0453  NA 145 150* 143  K 4.4 4.2 3.5  CL 109 118* 113*  CO2 25 23 24   BUN 40* 43* 33*  CREATININE 1.18* 1.06* 0.84  GLUCOSE 151* 140* 124*    Electrolytes  Recent Labs Lab 01/27/16 1124 01/28/16 0510 01/29/16 0453  CALCIUM 8.7* 8.6* 8.1*  MG  --  1.9  --   PHOS  --  4.7*  --     CBC  Recent Labs Lab 01/28/16 0510 01/28/16 1745 01/29/16 0453  WBC 11.6* 17.7* 8.3  HGB 11.6* 10.6* 9.4*  HCT 36.0 31.8* 29.0*  PLT 78* 91* 58*    Coag's  Recent Labs Lab 01/27/16 1124  INR 1.25    Sepsis Markers  Recent Labs Lab 01/27/16 1655 01/28/16 0510 01/29/16 0453  PROCALCITON <0.10 <0.10 <0.10    ABG  Recent Labs Lab 01/27/16 1757 01/28/16 0405  PHART 7.432 7.348*  PCO2ART 30.4* 40.1  PO2ART 340* 89.3    Liver Enzymes  Recent Labs Lab 01/27/16 1124  AST 23  ALT 14  ALKPHOS 103  BILITOT 0.8  ALBUMIN 3.7    Cardiac  Enzymes  Recent Labs Lab 01/27/16 1655 01/27/16 2235 01/28/16 0510  TROPONINI 0.09* 0.16* 0.08*    Glucose  Recent Labs Lab 01/28/16 0752 01/28/16 1147 01/28/16 1657  GLUCAP 130* 73 174*    Imaging Dg Chest Port 1 View  01/28/2016  CLINICAL DATA:  Status post extubation.  Near syncope 1 day ago. EXAM: PORTABLE CHEST 1 VIEW COMPARISON:  Earlier today. FINDINGS: The endotracheal tube has been removed. Stable right jugular catheter. The cardiac silhouette remains borderline enlarged. Stable oval calcification overlying the heart and left lower lung zone. Minimal increase in prominence of the pulmonary vasculature and interstitial markings. Old, healed right rib fractures. Thoracolumbar spine degenerative changes. IMPRESSION: 1. Interval mild pulmonary vascular congestion and minimal interstitial pulmonary edema. 2. Stable borderline cardiomegaly. Electronically Signed   By: Claudie Revering M.D.   On: 01/28/2016 10:29     STUDIES:  2/17 EGD >> ? esophageal varices, multiple bands  CULTURES:   ANTIBIOTICS: Vanco 2/17 >>  Zosyn 2/17 >>   SIGNIFICANT EVENTS: 2/17  Admit with hematemesis   LINES/TUBES: ETT 2/17 >>  R IJ TLC 2/17 >>   DISCUSSION: 82 y/o F with PMH of Dieulafoy lesion, mild dementia, AF, chronic sCHF, & HTN admitted with hematemesis thought to be related to esophageal varices (not present on prior EGD < 1 year ago).  Returned to ICU on vent for airway protection.    ASSESSMENT / PLAN:  GASTROINTESTINAL A:   Acute GIB - s/p EGD 2/17 per Dr. Amedeo Plenty with esophageal varices  Hx of Dieulafoy Lesion Evidence for Cirrhosis on RUQ [place] 2/17 Possible chronic cholecystitis vs changes associated with cirrhosis.  P:   Monitor for further bleeding Will obtain CT Imaging of ABD once stabilized, able to take PO contrast Protonix gtt >> d/c 2/19 Octreotide gtt  Clear diet > will defer to Gi as to when OK to advance diet NO OGT with multiple bands Follow hepatitis panel,  esp for chronic hep C  PULMONARY A: Intubated for Airway Protection  Possible Aspiration in setting of Hematemesis  LLL Airspace Disease / CAP - infiltrate +/- effusion  Hx Hypoxic Respiratory Failure - after prior 02/2015 admission for GIB, able to come off O2.  Works out at [employer]. P P:   Push pulm hygiene Intermittent CXR  See ID   CARDIOVASCULAR A:  Hx HTN, PAF, Chronic sCHF  P:  Monitor hemodynamics, change to SDU status Hold home Toprol, lasix / KCL for now PRN lopressor for SBP > 180 DNR in the event of cardiac arrest  RENAL A:   At Risk AKI - in setting of GIB  Hypernatremia - improving P:   Trend BMP / UOP  Replace electrolytes as indicated  Change IVF to d5 0.45 NS on 2/19  HEMATOLOGIC A:   Anemia - persistent  since 02/2015, prior negative colonoscopy and capsule endoscopy.  Has continued to drift down with some maroon stools,  P:  Trend CBC - continue to trend, note progressive drop last 36h SCD's for DVT prophylaxis   INFECTIOUS A:   Possible CAP + Aspiration - LLL airspace disease on initial CXR  P:   Monitor fever curve / WBC ABX as above  ENDOCRINE A:   At Risk Hypoglycemia  P:   Trend glucose on BMP  NEUROLOGIC A:   ICU associated pain / discomfort  Hx Dementia  P:   RASS goal: 0 PRN fentanyl for pain  Hold home Namenda, remeron; restart when able to take good PO   FAMILY  - Updates:  Family updated at bedside 2/18.  Goals of care discussed 2/16- continue current medical therapies.  Hopeful to stabilize and return to baseline.  In the event of cardiac arrest, no heroic measures.    - Inter-disciplinary family meet or Palliative Care meeting due by:  Ongoing  Change to SDU status, follow CBC  Baltazar Apo, MD, PhD 01/29/2016, 6:11 AM [facility] [phone] or if no answer [phone]

## 2016-01-29 NOTE — Evaluation (Signed)
Physical Therapy Evaluation Patient Details Name: Tiffany [name] MRN: [id number] DOB: 07-23-1929 Today's Date: 01/29/2016   History of Present Illness  Pt admitted with dx weakness, s/p fall and hematemesis.  Pt s/p EGD with variceal banding 01/27/16.  Pt with hx of dementia, CHF, back surgery(2012) and A-fib  Clinical Impression  Pt admitted as above and presenting with functional mobility limitations 2* generalized weakness, ambulatory instability and limited endurance.  Pt should progress to dc home with family assist and dependent on acute stay progress could benefit from follow HHPT.    Follow Up Recommendations Home health PT    Equipment Recommendations  None recommended by PT    Recommendations for Other Services OT consult     Precautions / Restrictions Precautions Precautions: Fall Restrictions Weight Bearing Restrictions: No      Mobility  Bed Mobility Overal bed mobility: Needs Assistance Bed Mobility: Supine to Sit;Sit to Supine     Supine to sit: Min guard Sit to supine: Min guard   General bed mobility comments: Min cues for sequence and to control LEs in/out bed  Transfers Overall transfer level: Needs assistance Equipment used: Rolling walker (2 wheeled) Transfers: Sit to/from Stand Sit to Stand: Min assist         General transfer comment: cues for use of UEs to self assist.  Min assist to rise and to initially steady  Ambulation/Gait Ambulation/Gait assistance: Min assist Ambulation Distance (Feet): 200 Feet Assistive device: Rolling walker (2 wheeled) Gait Pattern/deviations: Step-through pattern;Decreased step length - right;Decreased step length - left;Shuffle;Trunk flexed     General Gait Details: cues for posture, position from RW and pacing  Stairs            Wheelchair Mobility    Modified Rankin (Stroke Patients Only)       Balance Overall balance assessment: Needs assistance Sitting-balance support: No upper  extremity supported;Feet supported Sitting balance-Leahy Scale: Good     Standing balance support: No upper extremity supported Standing balance-Leahy Scale: Fair                               Pertinent Vitals/Pain Pain Assessment: No/denies pain    Home Living Family/patient expects to be discharged to:: Private residence Living Arrangements: Spouse/significant other Available Help at Discharge: Family Type of Home: House Home Access: Stairs to enter Entrance Stairs-Rails: Psychiatric nurse of Steps: 2 Home Layout: One level Home Equipment: Environmental consultant - 2 wheels      Prior Function Level of Independence: Independent               Hand Dominance        Extremity/Trunk Assessment   Upper Extremity Assessment: Generalized weakness           Lower Extremity Assessment: Generalized weakness      Cervical / Trunk Assessment: Kyphotic  Communication   Communication: No difficulties  Cognition Arousal/Alertness: Awake/alert Behavior During Therapy: WFL for tasks assessed/performed Overall Cognitive Status: Within Functional Limits for tasks assessed                      General Comments      Exercises        Assessment/Plan    PT Assessment Patient needs continued PT services  PT Diagnosis Difficulty walking   PT Problem List Decreased strength;Decreased activity tolerance;Decreased balance;Decreased mobility;Decreased knowledge of use of DME  PT Treatment Interventions DME instruction;Gait training;Stair  training;Functional mobility training;Therapeutic activities;Therapeutic exercise;Patient/family education   PT Goals (Current goals can be found in the Care Plan section) Acute Rehab PT Goals Patient Stated Goal: Home to take care of my 80 yr old husband PT Goal Formulation: With patient Time For Goal Achievement: 02/11/16 Potential to Achieve Goals: Good    Frequency Min 3X/week   Barriers to discharge         Co-evaluation               End of Session Equipment Utilized During Treatment: Gait belt Activity Tolerance: Patient tolerated treatment well Patient left: in bed;with call bell/phone within reach Nurse Communication: Mobility status         Time: 1327-1400 PT Time Calculation (min) (ACUTE ONLY): 33 min   Charges:   PT Evaluation $PT Eval Low Complexity: 1 Procedure PT Treatments $Gait Training: 8-22 mins   PT G Codes:        Isiah Scheel 2016/02/16, 3:46 PM

## 2016-01-30 ENCOUNTER — Ambulatory Visit: Payer: Self-pay

## 2016-01-30 ENCOUNTER — Encounter (HOSPITAL_COMMUNITY): Payer: Self-pay | Admitting: Gastroenterology

## 2016-01-30 DIAGNOSIS — K7460 Unspecified cirrhosis of liver: Secondary | ICD-10-CM

## 2016-01-30 LAB — BASIC METABOLIC PANEL
Anion gap: 5 (ref 5–15)
BUN: 22 mg/dL — AB (ref 6–20)
CALCIUM: 8.5 mg/dL — AB (ref 8.9–10.3)
CO2: 24 mmol/L (ref 22–32)
Chloride: 117 mmol/L — ABNORMAL HIGH (ref 101–111)
Creatinine, Ser: 0.86 mg/dL (ref 0.44–1.00)
GFR calc Af Amer: >60 mL/min (ref >60)
GFR calc non Af Amer: >60 mL/min (ref >60)
GLUCOSE: 119 mg/dL — AB (ref 65–99)
POTASSIUM: 3.8 mmol/L (ref 3.5–5.1)
Sodium: 146 mmol/L — ABNORMAL HIGH (ref 135–145)

## 2016-01-30 LAB — CBC
HCT: 30.7 % — ABNORMAL LOW (ref 36.0–46.0)
HEMOGLOBIN: 9.9 g/dL — AB (ref 12.0–15.0)
MCH: 30.8 pg (ref 26.0–34.0)
MCHC: 32.2 g/dL (ref 30.0–36.0)
MCV: 95.6 fL (ref 78.0–100.0)
PLATELETS: 73 10*3/uL — AB (ref 150–400)
RBC: 3.21 MIL/uL — AB (ref 3.87–5.11)
RDW: 16 % — ABNORMAL HIGH (ref 11.5–15.5)
WBC: 7.1 10*3/uL (ref 4.0–10.5)

## 2016-01-30 MED ORDER — MEMANTINE HCL ER 28 MG PO CP24
28.0000 mg | ORAL_CAPSULE | Freq: Every day | ORAL | Status: DC
  Administered 2016-01-30 – 2016-02-02 (×4): 28 mg via ORAL
  Filled 2016-01-30 (×4): qty 1

## 2016-01-30 MED ORDER — PANTOPRAZOLE SODIUM 40 MG IV SOLR
40.0000 mg | Freq: Two times a day (BID) | INTRAVENOUS | Status: DC
  Administered 2016-01-31 – 2016-02-02 (×6): 40 mg via INTRAVENOUS
  Filled 2016-01-30 (×6): qty 40

## 2016-01-30 MED ORDER — METOPROLOL TARTRATE 12.5 MG HALF TABLET
12.5000 mg | ORAL_TABLET | Freq: Two times a day (BID) | ORAL | Status: DC
  Administered 2016-01-30 – 2016-01-31 (×3): 12.5 mg via ORAL
  Filled 2016-01-30 (×3): qty 1

## 2016-01-30 NOTE — Progress Notes (Signed)
Subjective: No further hematemesis. Tolerating full liquid diet. Some melena.  Objective: Vital signs in last 24 hours: Temp:  [98 F (36.7 C)-98.6 F (37 C)] 98 F (36.7 C) (02/20 0700) Pulse Rate:  [30-104] 72 (02/20 0600) Resp:  [18-26] 22 (02/20 0800) BP: (118-158)/(42-65) 154/56 mmHg (02/20 0800) SpO2:  [89 %-99 %] 92 % (02/20 0800) Weight:  [69.2 kg (152 lb 8.9 oz)] 69.2 kg (152 lb 8.9 oz) (02/20 0210) Weight change: -0.4 kg (-14.1 oz) Last BM Date: 01/28/16  PE: GEN:  NAD, younger-appearing than stated age ABD:  Soft, non-tender  Lab Results: CBC    Component Value Date/Time   WBC 7.1 01/30/2016 0438   WBC 6.9 07/21/2015 1310   RBC 3.21* 01/30/2016 0438   RBC 4.05 07/21/2015 1310   HGB 9.9* 01/30/2016 0438   HGB 11.7 07/21/2015 1310   HCT 30.7* 01/30/2016 0438   HCT 38.6 07/21/2015 1310   PLT 73* 01/30/2016 0438   PLT 117* 07/21/2015 1310   MCV 95.6 01/30/2016 0438   MCV 95.3 07/21/2015 1310   MCH 30.8 01/30/2016 0438   MCH 28.9 07/21/2015 1310   MCHC 32.2 01/30/2016 0438   MCHC 30.3* 07/21/2015 1310   RDW 16.0* 01/30/2016 0438   RDW 15.4* 07/21/2015 1310   LYMPHSABS 0.9 01/27/2016 1124   LYMPHSABS 1.2 07/21/2015 1310   MONOABS 0.3 01/27/2016 1124   MONOABS 0.7 07/21/2015 1310   EOSABS 0.1 01/27/2016 1124   EOSABS 0.2 07/21/2015 1310   BASOSABS 0.0 01/27/2016 1124   BASOSABS 0.0 07/21/2015 1310   CMP     Component Value Date/Time   NA 146* 01/30/2016 0438   NA 146* 07/21/2015 1310   K 3.8 01/30/2016 0438   K 4.3 07/21/2015 1310   CL 117* 01/30/2016 0438   CO2 24 01/30/2016 0438   CO2 31* 07/21/2015 1310   GLUCOSE 119* 01/30/2016 0438   GLUCOSE 100 07/21/2015 1310   BUN 22* 01/30/2016 0438   BUN 40.7* 07/21/2015 1310   CREATININE 0.86 01/30/2016 0438   CREATININE 1.4* 07/21/2015 1310   CALCIUM 8.5* 01/30/2016 0438   CALCIUM 9.1 07/21/2015 1310   PROT 6.0* 01/27/2016 1124   PROT 6.9 07/21/2015 1310   ALBUMIN 3.7 01/27/2016 1124   ALBUMIN  3.9 07/21/2015 1310   AST 23 01/27/2016 1124   AST 32 07/21/2015 1310   ALT 14 01/27/2016 1124   ALT 24 07/21/2015 1310   ALKPHOS 103 01/27/2016 1124   ALKPHOS 169* 07/21/2015 1310   BILITOT 0.8 01/27/2016 1124   BILITOT 0.29 07/21/2015 1310   GFRNONAA >60 01/30/2016 0438   GFRAA >60 01/30/2016 0438   Assessment:  1.  Variceal bleeding, resolved with banding.  No evidence of ongoing acute GI tract bleeding. 2.  Evidence of cirrhosis on abdominal ultrasound. 3.  Black stool, likely passage of old blood. 4.  Acute on chronic anemia.  Plan:  1.  Advance diet to soft mechanical. 2.  Transition to pantoprazole 40 mg IV Q12 hours. 3.  Stopping octreotide today. 4.  Agree with CT scan for better characterization of liver, scan probably tomorrow. 5.  Will ultimately need repeat endoscopy in a few weeks for possible further banding. 6.  Likely able to be discharged home from GI standpoint in a couple days. 7.  [facility] will follow.   Landry Dyke 01/30/2016, 10:35 AM   Pager [phone] If no answer or after 5 PM call [phone]

## 2016-01-30 NOTE — Progress Notes (Signed)
PULMONARY / CRITICAL CARE MEDICINE   Name: Tiffany [name] MRN: [id number] DOB: [birth year]    ADMISSION DATE:  01/27/2016 CONSULTATION DATE:  01/27/16  REFERRING MD:  Dr. Amedeo Plenty  CHIEF COMPLAINT:  GIB, Intubated for Airway Protection  HISTORY OF PRESENT ILLNESS:  82 year old female with a past medical history of hypertension, chronic systolic heart failure, atrial fibrillation, arthritis, dementia (on Namenda), seasonal allergies, Raynaud's disease, GERD, prior GIB in 02/2015 thought to be related to a Dieulafoy lesion and persistent anemia s/p colonoscopy / capsule endoscopy that were unrevealing for anemia source who presented to [facility] on 2/17 after a near syncopal episode and vomiting up blood.    At baseline the patient lives at home with her spouse in a 1 level home. She continues to drive and goes to the [facility] for exercise. She is able to bathe / feed herself, some shopping and light cooking.  Had a caretaker for approximately 6 weeks early in 2016 after hospitalization.  After the 02/2015 admission she was seen by Dr. Lenna Gilford was able to come off oxygen after discharge. She has been very cautious to avoid caustic medications.  Family reports she was a social drinker (never heavy) but has not drank in the last 5 years.  Patient was in her usual state of health until 6:30 AM on 2/17 at which time she vomited up blood. Initially they were unsure that it was blood however a second episode had multiple clots. She also had a near syncopal episode. She denied LOC on admission. On presentation she denied black tarry or bloody stools.  GI was consulted in the emergency room and the patient taken to endoscopy for EGD. Notes reflect esophagus and proximal stomach were full of blood & clots in the fundus.  There also appeared to be a distal esophageal varix bleeding and multiple bands were placed. Estimated blood loss approximately 500 ML's per endoscopy staff. The patient was returned to ICU intubated.    PCCM  consulted for evaluation.  SUBJECTIVE:  No new complaints  VITAL SIGNS: BP 154/56 mmHg  Pulse 72  Temp(Src) 98.2 F (36.8 C) (Oral)  Resp 22  Ht 5\' 1"  (1.549 m)  Wt 152 lb 8.9 oz (69.2 kg)  BMI 28.84 kg/m2  SpO2 92%  HEMODYNAMICS:    VENTILATOR SETTINGS:    INTAKE / OUTPUT: I/O last 3 completed shifts: In: 3367.1 [I.V.:3317.1; IV Piggyback:50] Out: 3250 [Urine:3250]  PHYSICAL EXAMINATION: General:  Elderly female in NAD up in chair Neuro:  Awake, oriented, generalized weakness. No focal def   HEENT: MMM, no JVd  Cardiovascular:  s1s2 rrr, no m/r/g Lungs:  Even/non-labored, lungs bilaterally clear Abdomen:  Soft, non-distented Musculoskeletal:  No acute deformities  Skin:  Warm/dry, no edema   LABS:  BMET  Recent Labs Lab 01/28/16 0510 01/29/16 0453 01/30/16 0438  NA 150* 143 146*  K 4.2 3.5 3.8  CL 118* 113* 117*  CO2 23 24 24   BUN 43* 33* 22*  CREATININE 1.06* 0.84 0.86  GLUCOSE 140* 124* 119*    Electrolytes  Recent Labs Lab 01/28/16 0510 01/29/16 0453 01/30/16 0438  CALCIUM 8.6* 8.1* 8.5*  MG 1.9  --   --   PHOS 4.7*  --   --     CBC  Recent Labs Lab 01/29/16 0453 01/29/16 1700 01/30/16 0438  WBC 8.3 6.9 7.1  HGB 9.4* 9.7* 9.9*  HCT 29.0* 29.3* 30.7*  PLT 58* 60* 73*    Coag's  Recent Labs Lab 01/27/16 1124  INR 1.25    Sepsis Markers  Recent Labs Lab 01/27/16 1655 01/28/16 0510 01/29/16 0453  PROCALCITON <0.10 <0.10 <0.10    ABG  Recent Labs Lab 01/27/16 1757 01/28/16 0405  PHART 7.432 7.348*  PCO2ART 30.4* 40.1  PO2ART 340* 89.3    Liver Enzymes  Recent Labs Lab 01/27/16 1124  AST 23  ALT 14  ALKPHOS 103  BILITOT 0.8  ALBUMIN 3.7    Cardiac Enzymes  Recent Labs Lab 01/27/16 1655 01/27/16 2235 01/28/16 0510  TROPONINI 0.09* 0.16* 0.08*    Glucose  Recent Labs Lab 01/28/16 0752 01/28/16 1147 01/28/16 1657  GLUCAP 130* 73 174*    Imaging No results found.   STUDIES:   2/17 EGD >> ? esophageal varices, multiple bands  CULTURES:   ANTIBIOTICS: Vanco 2/17 >> off Zosyn 2/17 >> off   SIGNIFICANT EVENTS: 2/17  Admit with hematemesis   LINES/TUBES: ETT 2/17 >> 2/19 R IJ TLC 2/17 >>   DISCUSSION: 82 y/o F with PMH of Dieulafoy lesion, mild dementia, AF, chronic sCHF, & HTN admitted with hematemesis thought to be related to esophageal varices (not present on prior EGD < 1 year ago).  Returned to ICU on vent for airway protection. Will move to med/surg. Adv diet, if stable needs CT abd 2/21 to eval liver disease. Will need at least 48-72hrs to ensure no further bleeding  ASSESSMENT / PLAN:  GASTROINTESTINAL A:   Acute GIB - s/p EGD 2/17 per Dr. Amedeo Plenty with esophageal varices  Hx of Dieulafoy Lesion Evidence for Cirrhosis on RUQ [place] 2/17 Possible chronic cholecystitis vs changes associated with cirrhosis.  P:   Monitor for further bleeding Will obtain CT Imaging of ABD once stabilized, able to take PO contrast Protonix gtt >> d/c 2/19 D/c octreotide gtt today 2/20 Clear diet > will defer to Gi as to when OK to advance diet NO OGT with multiple bands Follow hepatitis panel, esp for chronic hep C  PULMONARY A: Intubated for Airway Protection -->now extubated  Possible Aspiration in setting of Hematemesis  LLL Airspace Disease / CAP - infiltrate +/- effusion  Hx Hypoxic Respiratory Failure - after prior 02/2015 admission for GIB, able to come off O2.  Works out at [employer]. P P:   Push pulm hygiene Intermittent CXR  See ID   CARDIOVASCULAR A:  Hx HTN, PAF, Chronic sCHF  P:  Resume lopressor  PRN lopressor for SBP > 180 DNR in the event of cardiac arrest  RENAL A:   At Risk AKI - in setting of GIB  Hypernatremia - P:   Trend BMP / UOP  Replace electrolytes as indicated  Cont D5/12 Encourage clear liquids. Thirst should correct Na leve;   HEMATOLOGIC A:   Anemia - persistent since 02/2015, prior negative colonoscopy and capsule  endoscopy.  Has continued to drift down with some maroon stools,  P:  Trend CBC  SCD's for DVT prophylaxis   INFECTIOUS A:   Possible CAP + Aspiration - LLL airspace disease on initial CXR  P:   Monitor fever curve / WBC ABX as above  ENDOCRINE A:   At Risk Hypoglycemia  P:   Trend glucose on BMP  NEUROLOGIC A:   ICU associated pain / discomfort  Hx Dementia  P:   RASS goal: 0 PRN fentanyl for pain  Resume home Namenda, remeron;   FAMILY  - Updates:  Family updated at bedside 2/20.  Goals of care discussed 2/16- continue current medical therapies.  Hopeful  to stabilize and return to baseline.  In the event of cardiac arrest, no heroic measures.    - Inter-disciplinary family meet or Palliative Care meeting due by:  Ongoing  Erick Colace ACNP-BC [place] Pager # [phone] OR # [phone] if no answer

## 2016-01-30 NOTE — Progress Notes (Signed)
[facility] Progress Note Patient Name: NAKARI SARAO DOB: 10/10/1929 MRN: [id number]   Date of Service  01/30/2016  HPI/Events of Note  Physician told family that they would check LFT's in AM. Lab study not ordered.   eICU Interventions  Will order CMP for AM.      Intervention Category Minor Interventions: Clinical assessment - ordering diagnostic tests  Lysle Dingwall 01/30/2016, 5:14 PM

## 2016-01-30 NOTE — Clinical Documentation Improvement (Signed)
Critical Care Gastroenterology  Can the diagnosis of "anemia" be further specified? Thank you   Iron deficiency Anemia  Nutritional anemia, including the nutrition or mineral deficits  Chronic Anemia, including the suspected or known cause  Anemia of chronic disease, including the associated chronic disease state  Acute on Chronic Blood loss anemia  Acute Blood loss anemia  Other  Clinically Undetermined  Document any associated diagnoses/conditions.   Supporting Information: Transfused 2 units PRBC  Please exercise your independent, professional judgment when responding. A specific answer is not anticipated or expected.   Thank You,  [place] [phone]

## 2016-01-31 ENCOUNTER — Inpatient Hospital Stay (HOSPITAL_COMMUNITY): Payer: PPO

## 2016-01-31 DIAGNOSIS — K7460 Unspecified cirrhosis of liver: Secondary | ICD-10-CM | POA: Insufficient documentation

## 2016-01-31 DIAGNOSIS — D62 Acute posthemorrhagic anemia: Secondary | ICD-10-CM

## 2016-01-31 LAB — TYPE AND SCREEN
ABO/RH(D): B NEG
Antibody Screen: NEGATIVE
UNIT DIVISION: 0
UNIT DIVISION: 0
Unit division: 0

## 2016-01-31 LAB — CBC
HEMATOCRIT: 32.8 % — AB (ref 36.0–46.0)
HEMOGLOBIN: 10.5 g/dL — AB (ref 12.0–15.0)
MCH: 30.5 pg (ref 26.0–34.0)
MCHC: 32 g/dL (ref 30.0–36.0)
MCV: 95.3 fL (ref 78.0–100.0)
Platelets: 93 10*3/uL — ABNORMAL LOW (ref 150–400)
RBC: 3.44 MIL/uL — ABNORMAL LOW (ref 3.87–5.11)
RDW: 16.1 % — ABNORMAL HIGH (ref 11.5–15.5)
WBC: 6.6 10*3/uL (ref 4.0–10.5)

## 2016-01-31 LAB — COMPREHENSIVE METABOLIC PANEL
ALBUMIN: 3.1 g/dL — AB (ref 3.5–5.0)
ALK PHOS: 83 U/L (ref 38–126)
ALT: 23 U/L (ref 14–54)
ANION GAP: 6 (ref 5–15)
AST: 34 U/L (ref 15–41)
BUN: 15 mg/dL (ref 6–20)
CO2: 24 mmol/L (ref 22–32)
Calcium: 8.4 mg/dL — ABNORMAL LOW (ref 8.9–10.3)
Chloride: 116 mmol/L — ABNORMAL HIGH (ref 101–111)
Creatinine, Ser: 0.75 mg/dL (ref 0.44–1.00)
GFR calc Af Amer: >60 mL/min (ref >60)
GFR calc non Af Amer: >60 mL/min (ref >60)
GLUCOSE: 120 mg/dL — AB (ref 65–99)
POTASSIUM: 3.4 mmol/L — AB (ref 3.5–5.1)
SODIUM: 146 mmol/L — AB (ref 135–145)
Total Bilirubin: 0.7 mg/dL (ref 0.3–1.2)
Total Protein: 5.5 g/dL — ABNORMAL LOW (ref 6.5–8.1)

## 2016-01-31 MED ORDER — FUROSEMIDE 10 MG/ML IJ SOLN
40.0000 mg | Freq: Once | INTRAMUSCULAR | Status: AC
  Administered 2016-01-31: 40 mg via INTRAVENOUS
  Filled 2016-01-31: qty 4

## 2016-01-31 MED ORDER — PROPRANOLOL HCL 10 MG PO TABS
20.0000 mg | ORAL_TABLET | Freq: Two times a day (BID) | ORAL | Status: DC
  Administered 2016-01-31 – 2016-02-02 (×4): 20 mg via ORAL
  Filled 2016-01-31 (×2): qty 2
  Filled 2016-01-31: qty 1
  Filled 2016-01-31 (×2): qty 2
  Filled 2016-01-31: qty 1

## 2016-01-31 MED ORDER — MENTHOL 3 MG MT LOZG: 1.0000 | LOZENGE | OROMUCOSAL | Status: DC | PRN

## 2016-01-31 MED ORDER — PROPRANOLOL HCL 20 MG PO TABS
20.0000 mg | ORAL_TABLET | Freq: Two times a day (BID) | ORAL | Status: DC
  Filled 2016-01-31: qty 1

## 2016-01-31 MED ORDER — IOHEXOL 300 MG/ML  SOLN
100.0000 mL | Freq: Once | INTRAMUSCULAR | Status: AC | PRN
  Administered 2016-01-31: 100 mL via INTRAVENOUS

## 2016-01-31 MED ORDER — POTASSIUM CHLORIDE CRYS ER 20 MEQ PO TBCR
30.0000 meq | EXTENDED_RELEASE_TABLET | ORAL | Status: AC
  Administered 2016-01-31 (×2): 30 meq via ORAL
  Filled 2016-01-31: qty 1
  Filled 2016-01-31: qty 1.5

## 2016-01-31 NOTE — Consult Note (Addendum)
   [facility] Inpatient Consult   01/31/2016  Tiffany [name] 1929-09-28 [id number]   Patient has been active with the [employer] [place] Management program. Martin Majestic to bedside to speak with patient and daughter, Danny Lawless.  Explained that patient will now be assigned to [facility] for post transition of care calls and possible monthly home visits. Consent is already in EPIC. Danny Lawless (daughter) indicates she is the primary contact at [phone]. Patient's daughter also reports that she thinks her mother needs additional assistance with home health. Made her aware that writer will pass this along to inpatient RNCM. Also explained that [place] Management will not interfere or replace services provided by home health. Appreciative of visit and remains agreeable to [place] [facility] follow up. Made inpatient RNCM aware that patient's daughter is requesting home health.    Marthenia Rolling, MSN-Ed, RN,BSN [facility] Liaison [phone]

## 2016-01-31 NOTE — Progress Notes (Signed)
[facility] PCCM ICU medical director rounds  Patient: Tiffany [name] MR: [id number]  After reviewing the chart and discussing the patient with nursing staff in the ICU, it does not appear that the patient has a condition that requires ICU or step down level of care.  In an effort to best utilize system resources I have written orders to transfer the patient to a non-ICU/SDU inpatient unit.  Roselie Awkward, MD [place] PCCM Pager: [phone] Cell: [phone] After 3pm or if no response, call [phone]  01/31/2016@11 :26 AM

## 2016-01-31 NOTE — Progress Notes (Signed)
PULMONARY / CRITICAL CARE MEDICINE   Name: Tiffany [name] MRN: [id number] DOB: February 05, 1929    ADMISSION DATE:  01/27/2016 CONSULTATION DATE:  01/27/16  REFERRING MD:  Dr. Amedeo Plenty  CHIEF COMPLAINT:  GIB, Intubated for Airway Protection  HISTORY OF PRESENT ILLNESS:  82 year old female with a past medical history of hypertension, chronic systolic heart failure, atrial fibrillation, arthritis, dementia (on Namenda), seasonal allergies, Raynaud's disease, GERD, prior GIB in 02/2015 thought to be related to a Dieulafoy lesion and persistent anemia s/p colonoscopy / capsule endoscopy that were unrevealing for anemia source who presented to [facility] on 2/17 after a near syncopal episode and vomiting up blood.    At baseline the patient lives at home with her spouse in a 1 level home. She continues to drive and goes to the [facility] for exercise. She is able to bathe / feed herself, some shopping and light cooking.  Had a caretaker for approximately 6 weeks early in 2016 after hospitalization.  After the 02/2015 admission she was seen by Dr. Lenna Gilford was able to come off oxygen after discharge. She has been very cautious to avoid caustic medications.  Family reports she was a social drinker (never heavy) but has not drank in the last 5 years.  Patient was in her usual state of health until 6:30 AM on 2/17 at which time she vomited up blood. Initially they were unsure that it was blood however a second episode had multiple clots. She also had a near syncopal episode. She denied LOC on admission. On presentation she denied black tarry or bloody stools.  GI was consulted in the emergency room and the patient taken to endoscopy for EGD. Notes reflect esophagus and proximal stomach were full of blood & clots in the fundus.  There also appeared to be a distal esophageal varix bleeding and multiple bands were placed. Estimated blood loss approximately 500 ML's per endoscopy staff. The patient was returned to ICU intubated.    PCCM  consulted for evaluation.  SUBJECTIVE:  No new complaints  VITAL SIGNS: BP 140/78 mmHg  Pulse 72  Temp(Src) 97.8 F (36.6 C) (Oral)  Resp 21  Ht 5\' 1"  (1.549 m)  Wt 152 lb 8.9 oz (69.2 kg)  BMI 28.84 kg/m2  SpO2 96% 2 liters   INTAKE / OUTPUT: I/O last 3 completed shifts: In: 2780 [P.O.:680; I.V.:2100] Out: 3850 [Urine:3850]  PHYSICAL EXAMINATION: General:  Elderly female in NAD  Neuro:  Awake, oriented, generalized weakness. No focal def   HEENT: MMM, no JVd  Cardiovascular:  s1s2 rrr, no m/r/g Lungs:  Even/non-labored, decreased right > left  Abdomen:  Soft, non-distented Musculoskeletal:  No acute deformities  Skin:  Warm/dry, no edema   LABS:  BMET  Recent Labs Lab 01/29/16 0453 01/30/16 0438 01/31/16 0500  NA 143 146* 146*  K 3.5 3.8 3.4*  CL 113* 117* 116*  CO2 24 24 24   BUN 33* 22* 15  CREATININE 0.84 0.86 0.75  GLUCOSE 124* 119* 120*    Electrolytes  Recent Labs Lab 01/28/16 0510 01/29/16 0453 01/30/16 0438 01/31/16 0500  CALCIUM 8.6* 8.1* 8.5* 8.4*  MG 1.9  --   --   --   PHOS 4.7*  --   --   --     CBC  Recent Labs Lab 01/29/16 1700 01/30/16 0438 01/31/16 0735  WBC 6.9 7.1 6.6  HGB 9.7* 9.9* 10.5*  HCT 29.3* 30.7* 32.8*  PLT 60* 73* 93*    Coag's  Recent Labs Lab  01/27/16 1124  INR 1.25    Sepsis Markers  Recent Labs Lab 01/27/16 1655 01/28/16 0510 01/29/16 0453  PROCALCITON <0.10 <0.10 <0.10    ABG  Recent Labs Lab 01/27/16 1757 01/28/16 0405  PHART 7.432 7.348*  PCO2ART 30.4* 40.1  PO2ART 340* 89.3    Liver Enzymes  Recent Labs Lab 01/27/16 1124 01/31/16 0500  AST 23 34  ALT 14 23  ALKPHOS 103 83  BILITOT 0.8 0.7  ALBUMIN 3.7 3.1*    Cardiac Enzymes  Recent Labs Lab 01/27/16 1655 01/27/16 2235 01/28/16 0510  TROPONINI 0.09* 0.16* 0.08*    Glucose  Recent Labs Lab 01/28/16 0752 01/28/16 1147 01/28/16 1657  GLUCAP 130* 73 174*    Imaging Ct Abd Wo & W Cm  01/31/2016   CLINICAL DATA:  Cirrhosis.  CHF.  Inpatient. EXAM: CT ABDOMEN WITHOUT AND WITH CONTRAST TECHNIQUE: Multidetector CT imaging of the abdomen was performed following the standard protocol before and following the bolus administration of intravenous contrast. CONTRAST:  180mL OMNIPAQUE IOHEXOL 300 MG/ML  SOLN COMPARISON:  01/27/2016 abdominal sonogram. 03/08/2015 high-resolution chest CT study. 03/01/2015 CT abdomen/pelvis. FINDINGS: Lower chest: Partially visualized right pleural effusion, at least moderate to large in size. Small layering left pleural effusion. Complete right lower lobe atelectasis. Segmental right middle lobe atelectasis. Compressive atelectasis in the dependent left lower lobe. Partially visualized is the tip of a superior approach central venous catheter in the middle third of the superior vena cava. Mild cardiomegaly and coronary atherosclerosis. Trace pericardial fluid/thickening. Fluid throughout the mildly patulous lower thoracic esophagus. Hepatobiliary: Diffusely irregular liver contour, keeping with cirrhosis. Stable granulomatous calcification in the left liver lobe. No arterial phase foci of hyperenhancement in the liver. No liver mass. Mild gallbladder distention. There is a 3 mm density in the dependent gallbladder lumen, which could represent a tiny gallstone (series 3/ image 45). There is mild-to-moderate diffuse gallbladder wall thickening with trace pericholecystic fluid. No biliary ductal dilatation. Pancreas: Normal, with no mass or duct dilation. Spleen: Mild splenomegaly (splenic dimensions 16.0 x 8.2 cm axially and 10.3 cm craniocaudal, for a splenic volume of 703 cc), increased since 03/01/2015. No splenic mass. Adrenals/Urinary Tract: Normal adrenals. No hydronephrosis. Simple 1.2 cm renal cyst in the lower left kidney. Simple 2.4 cm renal cyst in the posterior interpolar left kidney. Several additional subcentimeter hypodense renal cortical lesions in both kidneys are too  small to characterize. Stomach/Bowel: Grossly normal stomach. Visualized small and large bowel is normal caliber, with no bowel wall thickening. Mild scattered diverticulosis in the visualize colon. Vascular/Lymphatic: Atherosclerotic nonaneurysmal abdominal aorta. Patent portal, splenic, hepatic and renal veins. Mild gastroesophageal varices. No pathologically enlarged lymph nodes in the abdomen. Other: No pneumoperitoneum, ascites or focal fluid collection. Musculoskeletal: No aggressive appearing focal osseous lesions. Marked degenerative changes in the visualized thoracolumbar spine. Bilateral posterior spinal fusion hardware in the lower lumbar spine, with no gross evidence of hardware fracture or loosening. IMPRESSION: 1. Cirrhosis.  No liver mass. 2. Patent portal, splenic and hepatic veins. 3. Mild splenomegaly, increased. Stable mild gastroesophageal varices. No ascites. 4. Mildly distended gallbladder with mild to moderate diffuse gallbladder wall thickening and trace pericholecystic fluid. Possible tiny gallstone. These findings are nonspecific. The gallbladder wall thickening could be due to non-inflammatory edema such as due to hypoalbuminemia, although acute cholecystitis cannot be excluded on the basis of this examination. If there is clinical concern for acute cholecystitis, consider correlation with hepatobiliary scintigraphy. 5. Moderate to large right and small left pleural effusions  with associated compressive atelectasis. 6. Mild cardiomegaly. Coronary atherosclerosis. Trace pericardial fluid/thickening. 7. Fluid in the lower thoracic esophagus, suggesting esophageal dysmotility and/or gastroesophageal reflux. Electronically Signed   By: Ilona Sorrel M.D.   On: 01/31/2016 13:18   CT abd w/ large right effusion   STUDIES:  2/17 EGD >> ? esophageal varices, multiple bands  CULTURES:   ANTIBIOTICS: Vanco 2/17 >> off Zosyn 2/17 >> off   SIGNIFICANT EVENTS: 2/17  Admit with  hematemesis   LINES/TUBES: ETT 2/17 >> 2/19 R IJ TLC 2/17 >>   DISCUSSION: 82 y/o F with PMH of Dieulafoy lesion, mild dementia, AF, chronic sCHF, & HTN admitted with hematemesis thought to be related to esophageal varices (not present on prior EGD < 1 year ago).  Now w/ evidence of cirrhosis and portal HTN. Source remains unclear. She is doing well. CT of abd did incidentally identify new mod/large right effusion. This is most likely a result of volume resuscitation efforts in a pt w/ known cardiomyopathy as well as newly diagnosed liver disease. Will add lasix and adjust antihypertensive rx. Have changed her lopressor to propranolol to address probable portal HTN in addition to her HTn and systolic disease. We will f/u a CXR in the am.   ASSESSMENT / PLAN:  Right Pleural effusion. Likely a complication of Known systolic dysfxn and volume resuscitation efforts.  Plan:   Lasix F/u AM CXR    Hx HTN, PAF, Chronic sCHF (EF 40-45%) Plan:  Add lasix back; IV for now, then eventually transition back to oral dose Will switch her to non-selective BB to treat both Propranolol 20mg  bid  DNR in the event of cardiac arrest  Acute GIB - s/p EGD 2/17 per Dr. Amedeo Plenty with esophageal varices w/ new dx of Cirrhosis -->GIB resolved s/p banding.  Portal HTN (clinically) Plan:   Cont diet  Cont PPI BID Add non-selective BB   Hypernatremia Plan Allow oral free water intake  Hypokalemia  Plan Replace and recheck   Hx Dementia  Plan:   Resumed home Namenda, remeron;   Erick Colace ACNP-BC [place] Pager # [phone] OR # [phone] if no answer

## 2016-01-31 NOTE — Progress Notes (Signed)
Subjective: Tolerating soft diet. No further melena. No hematemesis.  Objective: Vital signs in last 24 hours: Temp:  [98 F (36.7 C)-98.9 F (37.2 C)] 98.9 F (37.2 C) (02/21 0400) Resp:  [13-29] 23 (02/21 0600) BP: (127-169)/(42-68) 147/54 mmHg (02/21 0507) SpO2:  [88 %-95 %] 92 % (02/21 0600) Weight change:  Last BM Date: 01/30/16  PE: GEN:  NAD, pleasant  Lab Results: CBC    Component Value Date/Time   WBC 6.6 01/31/2016 0735   WBC 6.9 07/21/2015 1310   RBC 3.44* 01/31/2016 0735   RBC 4.05 07/21/2015 1310   HGB 10.5* 01/31/2016 0735   HGB 11.7 07/21/2015 1310   HCT 32.8* 01/31/2016 0735   HCT 38.6 07/21/2015 1310   PLT 93* 01/31/2016 0735   PLT 117* 07/21/2015 1310   MCV 95.3 01/31/2016 0735   MCV 95.3 07/21/2015 1310   MCH 30.5 01/31/2016 0735   MCH 28.9 07/21/2015 1310   MCHC 32.0 01/31/2016 0735   MCHC 30.3* 07/21/2015 1310   RDW 16.1* 01/31/2016 0735   RDW 15.4* 07/21/2015 1310   LYMPHSABS 0.9 01/27/2016 1124   LYMPHSABS 1.2 07/21/2015 1310   MONOABS 0.3 01/27/2016 1124   MONOABS 0.7 07/21/2015 1310   EOSABS 0.1 01/27/2016 1124   EOSABS 0.2 07/21/2015 1310   BASOSABS 0.0 01/27/2016 1124   BASOSABS 0.0 07/21/2015 1310   CMP     Component Value Date/Time   NA 146* 01/31/2016 0500   NA 146* 07/21/2015 1310   K 3.4* 01/31/2016 0500   K 4.3 07/21/2015 1310   CL 116* 01/31/2016 0500   CO2 24 01/31/2016 0500   CO2 31* 07/21/2015 1310   GLUCOSE 120* 01/31/2016 0500   GLUCOSE 100 07/21/2015 1310   BUN 15 01/31/2016 0500   BUN 40.7* 07/21/2015 1310   CREATININE 0.75 01/31/2016 0500   CREATININE 1.4* 07/21/2015 1310   CALCIUM 8.4* 01/31/2016 0500   CALCIUM 9.1 07/21/2015 1310   PROT 5.5* 01/31/2016 0500   PROT 6.9 07/21/2015 1310   ALBUMIN 3.1* 01/31/2016 0500   ALBUMIN 3.9 07/21/2015 1310   AST 34 01/31/2016 0500   AST 32 07/21/2015 1310   ALT 23 01/31/2016 0500   ALT 24 07/21/2015 1310   ALKPHOS 83 01/31/2016 0500   ALKPHOS 169* 07/21/2015  1310   BILITOT 0.7 01/31/2016 0500   BILITOT 0.29 07/21/2015 1310   GFRNONAA >60 01/31/2016 0500   GFRAA >60 01/31/2016 0500   Assessment:  1. Variceal bleeding, resolved with banding. No evidence of ongoing acute GI tract bleeding. 2. Evidence of cirrhosis on abdominal ultrasound. 3. Black stool, likely passage of old blood. 4. Acute on chronic anemia.  Plan:  1.  CT abdomen with contrast to characterize liver and portal vasculature, today. 2.  Transition to pantoprazole 40 mg po bid. 3.  Soft diet. 4.  Off octreotide. 5.  Follow CBCs. 6.  Anticipate discharge home tomorrow.   Landry Dyke 01/31/2016, 9:02 AM   Pager [phone] If no answer or after 5 PM call [phone]

## 2016-01-31 NOTE — Progress Notes (Signed)
Patient Demographics  Tiffany [name], is a 82 y.o. female, DOB - August 26, 1929, [id number]  Admit date - 01/27/2016   Admitting Physician Teena Irani, MD  Outpatient Primary MD for the patient is Kennyth Arnold, FNP  LOS - 4   Chief Complaint  Patient presents with  . Hematemesis    pt reports spitting blood from her mouth  . Fall  . Weakness       Admission HPI/Brief narrative: 82 year old female with a past medical history of hypertension, chronic systolic heart failure, atrial fibrillation, arthritis, dementia (on Namenda), seasonal allergies, Raynaud's disease, GERD, prior GIB in 02/2015 thought to be related to a Dieulafoy lesion and persistent anemia s/p colonoscopy / capsule endoscopy that were unrevealing for anemia source who presented to [facility] on 2/17 after a near syncopal episode and vomiting up blood.  Admitted by PCCM as she required intubation post endoscopy, endoscopy noted for esophagus and proximal stomach full of blood, and clots in the fundus, which appears to be distal esophageal varices bleeding and multiple bands were placed  Significant events: - Endoscopy 2/17 - Intubated 2/17 - 2 units PRBC transfusion on 2/17 - Extubated 2/18 - PCCM>>triad 2/21  Subjective:   Tiffany [name] today has, No headache, No chest pain, No abdominal pain - No Nausea, tolerating by mouth . Assessment & Plan    Active Problems:   GIB (gastrointestinal bleeding)  Upper GI bleed secondary to esophageal varices /anemia of acute blood loss  - Patient known history of Dieulafoy Lesion in the past  - Status post EGD on 2/17 per Dr. Amedeo Plenty, significant for esophageal varices, status post banding  - Required 2 units PRBC transfusion 2/17, hemoglobin remained stable since  - Initially on Protonix drip, stop 2/19  - Initially on octreotide drip was stopped 2/20  - Tolerating clear liquid diet,  advance to soft diet per GI . - Monitor H&H and transfuse as needed  - Followed by GI   Liver cirrhosis - Unclear etiology, GI is following, CT abdomen pelvis today with evidence of liver cirrhosis  Acute respiratory failure - Patient intubated for airway protection, currently extubated - CAP??, pleural efusion contributing to respiratory failure - Still on 6 L nasal cannula, wean as tolerated  Dementia  - Continue with home medication   Chronic combined systolic and diastolic CHF  -  last 2 D ECHO 02/2015 with EF 40-45% and grade I diastolic CHF - Patient with evidence of large right pleural effusion , will start on IV diuresis .  Right pleural effusion - Will start an IV diuresis, will give one dose of IV Lasix today, repeat x-ray in a.m. - Pulmonary to evaluate in a.m. to see if thoracentesis is indicated  Hypernatremia - peak at 150, currently improving, will monitor closely as will start on diuresis.  Thrombocytopenia - In the setting of liver cirrhosis, splenomegaly, SCD for DVT prophylaxis.   Code Status: Partial  Family Communication: None at bedside  Disposition Plan: Instructed to [place], consult PT   Procedures  Endoscopy 2/17 Intubation 2/17 Extubation 2/18 One unit PRBC transfusion 2/17   Consults   PCCM>>TRH 2/21 Gastroenterology   Medications  Scheduled Meds: . memantine  28 mg Oral Daily  . metoprolol tartrate  12.5 mg Oral BID  . mupirocin ointment  1 application Nasal BID  . pantoprazole (PROTONIX) IV  40 mg Intravenous Q12H   Continuous Infusions: . dextrose 5 % and 0.45% NaCl 50 mL/hr at 01/30/16 2230   PRN Meds:.sodium chloride, acetaminophen, menthol-cetylpyridinium, ondansetron (ZOFRAN) IV  DVT Prophylaxis SCDs   Lab Results  Component Value Date   PLT 93* 01/31/2016    Antibiotics    Anti-infectives    None          Objective:   Filed Vitals:   01/31/16 0600 01/31/16 0708 01/31/16 0800 01/31/16 1000  BP:    170/51 148/61  Pulse:      Temp:  98.1 F (36.7 C)    TempSrc:      Resp: 23  17 20   Height:      Weight:      SpO2: 92%  94% 93%    Wt Readings from Last 3 Encounters:  01/30/16 69.2 kg (152 lb 8.9 oz)  01/10/16 63.594 kg (140 lb 3.2 oz)  11/23/15 64.819 kg (142 lb 14.4 oz)     Intake/Output Summary (Last 24 hours) at 01/31/16 1420 Last data filed at 01/31/16 1108  Gross per 24 hour  Intake   1680 ml  Output   1050 ml  Net    630 ml     Physical Exam  Awake Alert, negative [place]ard.AT, dry oral mucosa Symmetrical Chest wall movement, diminished air entry on the right, no wheezing RRR,No Gallops,Rubs or new Murmurs, No Parasternal Heave +ve B.Sounds, Abd Soft, No tenderness,  No rebound - guarding or rigidity. No Cyanosis, Clubbing or edema, No new Rash or bruise    Data Review   Micro Results Recent Results (from the past 240 hour(s))  MRSA PCR Screening     Status: Abnormal   Collection Time: 01/27/16  3:13 PM  Result Value Ref Range Status   MRSA by PCR POSITIVE (A) NEGATIVE Final    Comment:        The GeneXpert MRSA Assay (FDA approved for NASAL specimens only), is one component of a comprehensive MRSA colonization surveillance program. It is not intended to diagnose MRSA infection nor to guide or monitor treatment for MRSA infections. RESULT CALLED TO, READ BACK BY AND VERIFIED WITH: L.FREI AT [id number] ON 01/27/16 BY S.VANHOORNE     Radiology Reports Dg Chest 1 View  01/27/2016  CLINICAL DATA:  Acute respiratory arrest with intubation. Current history of hypertension, atrial fibrillation, chronic systolic and diastolic heart failure. EXAM: Portable CHEST 1 VIEW 3:11 p.m.: COMPARISON:  06/23/2015 and earlier. FINDINGS: Endotracheal tube tip is at the carina. Cardiac silhouette mildly to moderately enlarged, unchanged. Dense consolidation in the left lower lobe silhouetting the left hemidiaphragm, associated with a possible left pleural effusion. Pulmonary  vascularity normal without evidence of pulmonary edema. Lungs otherwise clear. No right pleural effusion. IMPRESSION: 1. Endotracheal tube tip is at the carina. It should be withdrawn approximately 4-5 cm. 2. Left lower lobe pneumonia with a possible left parapneumonic effusion. 3. Stable mild to moderate cardiomegaly without pulmonary edema. I telephoned these results at the time of interpretation on 01/27/2016 at 3:36 pm to Lattie Haw, the nurse caring for the patient in the ICU, who verbally acknowledged these results. Electronically Signed   By: Evangeline Dakin M.D.   On: 01/27/2016 15:37   Ct Abd Wo & W Cm  01/31/2016  CLINICAL DATA:  Cirrhosis.  CHF.  Inpatient. EXAM: CT ABDOMEN WITHOUT AND WITH CONTRAST TECHNIQUE:  Multidetector CT imaging of the abdomen was performed following the standard protocol before and following the bolus administration of intravenous contrast. CONTRAST:  148mL OMNIPAQUE IOHEXOL 300 MG/ML  SOLN COMPARISON:  01/27/2016 abdominal sonogram. 03/08/2015 high-resolution chest CT study. 03/01/2015 CT abdomen/pelvis. FINDINGS: Lower chest: Partially visualized right pleural effusion, at least moderate to large in size. Small layering left pleural effusion. Complete right lower lobe atelectasis. Segmental right middle lobe atelectasis. Compressive atelectasis in the dependent left lower lobe. Partially visualized is the tip of a superior approach central venous catheter in the middle third of the superior vena cava. Mild cardiomegaly and coronary atherosclerosis. Trace pericardial fluid/thickening. Fluid throughout the mildly patulous lower thoracic esophagus. Hepatobiliary: Diffusely irregular liver contour, keeping with cirrhosis. Stable granulomatous calcification in the left liver lobe. No arterial phase foci of hyperenhancement in the liver. No liver mass. Mild gallbladder distention. There is a 3 mm density in the dependent gallbladder lumen, which could represent a tiny gallstone (series 3/  image 45). There is mild-to-moderate diffuse gallbladder wall thickening with trace pericholecystic fluid. No biliary ductal dilatation. Pancreas: Normal, with no mass or duct dilation. Spleen: Mild splenomegaly (splenic dimensions 16.0 x 8.2 cm axially and 10.3 cm craniocaudal, for a splenic volume of 703 cc), increased since 03/01/2015. No splenic mass. Adrenals/Urinary Tract: Normal adrenals. No hydronephrosis. Simple 1.2 cm renal cyst in the lower left kidney. Simple 2.4 cm renal cyst in the posterior interpolar left kidney. Several additional subcentimeter hypodense renal cortical lesions in both kidneys are too small to characterize. Stomach/Bowel: Grossly normal stomach. Visualized small and large bowel is normal caliber, with no bowel wall thickening. Mild scattered diverticulosis in the visualize colon. Vascular/Lymphatic: Atherosclerotic nonaneurysmal abdominal aorta. Patent portal, splenic, hepatic and renal veins. Mild gastroesophageal varices. No pathologically enlarged lymph nodes in the abdomen. Other: No pneumoperitoneum, ascites or focal fluid collection. Musculoskeletal: No aggressive appearing focal osseous lesions. Marked degenerative changes in the visualized thoracolumbar spine. Bilateral posterior spinal fusion hardware in the lower lumbar spine, with no gross evidence of hardware fracture or loosening. IMPRESSION: 1. Cirrhosis.  No liver mass. 2. Patent portal, splenic and hepatic veins. 3. Mild splenomegaly, increased. Stable mild gastroesophageal varices. No ascites. 4. Mildly distended gallbladder with mild to moderate diffuse gallbladder wall thickening and trace pericholecystic fluid. Possible tiny gallstone. These findings are nonspecific. The gallbladder wall thickening could be due to non-inflammatory edema such as due to hypoalbuminemia, although acute cholecystitis cannot be excluded on the basis of this examination. If there is clinical concern for acute cholecystitis, consider  correlation with hepatobiliary scintigraphy. 5. Moderate to large right and small left pleural effusions with associated compressive atelectasis. 6. Mild cardiomegaly. Coronary atherosclerosis. Trace pericardial fluid/thickening. 7. Fluid in the lower thoracic esophagus, suggesting esophageal dysmotility and/or gastroesophageal reflux. Electronically Signed   By: Ilona Sorrel M.D.   On: 01/31/2016 13:18   Dg Chest Port 1 View  01/28/2016  CLINICAL DATA:  Status post extubation.  Near syncope 1 day ago. EXAM: PORTABLE CHEST 1 VIEW COMPARISON:  Earlier today. FINDINGS: The endotracheal tube has been removed. Stable right jugular catheter. The cardiac silhouette remains borderline enlarged. Stable oval calcification overlying the heart and left lower lung zone. Minimal increase in prominence of the pulmonary vasculature and interstitial markings. Old, healed right rib fractures. Thoracolumbar spine degenerative changes. IMPRESSION: 1. Interval mild pulmonary vascular congestion and minimal interstitial pulmonary edema. 2. Stable borderline cardiomegaly. Electronically Signed   By: Claudie Revering M.D.   On: 01/28/2016 10:29  Dg Chest Port 1 View  01/28/2016  CLINICAL DATA:  Acute respiratory failure EXAM: PORTABLE CHEST 1 VIEW COMPARISON:  01/27/2016 FINDINGS: Right jugular venous catheter and endotracheal tube are stable. Left basilar consolidation has improved. Right basilar atelectasis has increased with lower lung volumes. No pneumothorax. IMPRESSION: Improved left basilar consolidation. Increasing right basilar atelectasis. Electronically Signed   By: Marybelle Killings M.D.   On: 01/28/2016 08:17   Dg Chest Port 1 View  01/27/2016  CLINICAL DATA:  Central line placement. EXAM: PORTABLE CHEST 1 VIEW COMPARISON:  01/27/2016 at 1511 hours FINDINGS: The endotracheal tube has been withdrawn. Tip now projects 3.5 cm above the carina. Right internal jugular central venous line tip projects the lower superior vena cava.  No pneumothorax Left lung base consolidation is without change previous exam. No new lung abnormalities. IMPRESSION: 1. Endotracheal tube now well positioned with its tip 3.5 cm above the carina. 2. New right internal jugular central venous line tip lies in the lower superior vena cava. No pneumothorax. Electronically Signed   By: Lajean Manes M.D.   On: 01/27/2016 16:55   US Abdomen Limited Ruq  01/27/2016  CLINICAL DATA:  82 year old female with nausea and vomiting. EXAM: US ABDOMEN LIMITED - RIGHT UPPER QUADRANT COMPARISON:  CT dated 03/01/2015 FINDINGS: Gallbladder: There is diffuse edematous appearance and thickening of the gallbladder wall. The gallbladder wall measures approximately 11 mm in thickness. There is no gallstone or pericholecystic fluid. Evaluation for Murphy's sign was limited as the patient is vented. Common bile duct: Diameter: 4 mm. Liver: The liver demonstrates a heterogeneous echotexture. IMPRESSION: Diffuse thickening of the gallbladder wall may be related to underlying cirrhosis or represent chronic cholecystitis. An acalculous cholecystitis is not excluded. Clinical correlation is recommended. Heterogeneous liver secondary to underlying cirrhosis. Electronically Signed   By: Anner Crete M.D.   On: 01/27/2016 18:58     CBC  Recent Labs Lab 01/27/16 1124  01/28/16 1745 01/29/16 0453 01/29/16 1700 01/30/16 0438 01/31/16 0735  WBC 10.6*  < > 17.7* 8.3 6.9 7.1 6.6  HGB 10.7*  < > 10.6* 9.4* 9.7* 9.9* 10.5*  HCT 34.5*  < > 31.8* 29.0* 29.3* 30.7* 32.8*  PLT 111*  < > 91* 58* 60* 73* 93*  MCV 93.5  < > 93.0 95.1 93.6 95.6 95.3  MCH 29.0  < > 31.0 30.8 31.0 30.8 30.5  MCHC 31.0  < > 33.3 32.4 33.1 32.2 32.0  RDW 14.1  < > 15.9* 16.1* 16.0* 16.0* 16.1*  LYMPHSABS 0.9  --   --   --   --   --   --   MONOABS 0.3  --   --   --   --   --   --   EOSABS 0.1  --   --   --   --   --   --   BASOSABS 0.0  --   --   --   --   --   --   < > = values in this interval not  displayed.  Chemistries   Recent Labs Lab 01/27/16 1124 01/28/16 0510 01/29/16 0453 01/30/16 0438 01/31/16 0500  NA 145 150* 143 146* 146*  K 4.4 4.2 3.5 3.8 3.4*  CL 109 118* 113* 117* 116*  CO2 25 23 24 24 24   GLUCOSE 151* 140* 124* 119* 120*  BUN 40* 43* 33* 22* 15  CREATININE 1.18* 1.06* 0.84 0.86 0.75  CALCIUM 8.7* 8.6* 8.1* 8.5* 8.4*  MG  --  1.9  --   --   --   AST 23  --   --   --  34  ALT 14  --   --   --  23  ALKPHOS 103  --   --   --  83  BILITOT 0.8  --   --   --  0.7   ------------------------------------------------------------------------------------------------------------------ estimated creatinine clearance is 46.6 mL/min (by C-G formula based on Cr of 0.75). ------------------------------------------------------------------------------------------------------------------ No results for input(s): HGBA1C in the last 72 hours. ------------------------------------------------------------------------------------------------------------------ No results for input(s): CHOL, HDL, LDLCALC, TRIG, CHOLHDL, LDLDIRECT in the last 72 hours. ------------------------------------------------------------------------------------------------------------------ No results for input(s): TSH, T4TOTAL, T3FREE, THYROIDAB in the last 72 hours.  Invalid input(s): FREET3 ------------------------------------------------------------------------------------------------------------------ No results for input(s): VITAMINB12, FOLATE, FERRITIN, TIBC, IRON, RETICCTPCT in the last 72 hours.  Coagulation profile  Recent Labs Lab 01/27/16 1124  INR 1.25    No results for input(s): DDIMER in the last 72 hours.  Cardiac Enzymes  Recent Labs Lab 01/27/16 1655 01/27/16 2235 01/28/16 0510  TROPONINI 0.09* 0.16* 0.08*   ------------------------------------------------------------------------------------------------------------------ Invalid input(s): POCBNP     Time Spent in  minutes   30 minutes   ELGERGAWY, DAWOOD M.D on 01/31/2016 at 2:20 PM  Between 7am to 7pm - Pager - [phone]  After 7pm go to [email or web address] - password [facility]  [facility]   Office  [phone]

## 2016-02-01 ENCOUNTER — Inpatient Hospital Stay (HOSPITAL_COMMUNITY): Payer: PPO

## 2016-02-01 DIAGNOSIS — F0280 Dementia in other diseases classified elsewhere without behavioral disturbance: Secondary | ICD-10-CM

## 2016-02-01 DIAGNOSIS — I5022 Chronic systolic (congestive) heart failure: Secondary | ICD-10-CM

## 2016-02-01 DIAGNOSIS — K920 Hematemesis: Secondary | ICD-10-CM

## 2016-02-01 DIAGNOSIS — I10 Essential (primary) hypertension: Secondary | ICD-10-CM

## 2016-02-01 DIAGNOSIS — G309 Alzheimer's disease, unspecified: Secondary | ICD-10-CM

## 2016-02-01 DIAGNOSIS — J90 Pleural effusion, not elsewhere classified: Secondary | ICD-10-CM

## 2016-02-01 LAB — BASIC METABOLIC PANEL
ANION GAP: 9 (ref 5–15)
BUN: 16 mg/dL (ref 6–20)
CHLORIDE: 112 mmol/L — AB (ref 101–111)
CO2: 24 mmol/L (ref 22–32)
Calcium: 8.4 mg/dL — ABNORMAL LOW (ref 8.9–10.3)
Creatinine, Ser: 0.92 mg/dL (ref 0.44–1.00)
GFR calc non Af Amer: 56 mL/min — ABNORMAL LOW (ref >60)
Glucose, Bld: 122 mg/dL — ABNORMAL HIGH (ref 65–99)
Potassium: 3.3 mmol/L — ABNORMAL LOW (ref 3.5–5.1)
Sodium: 145 mmol/L (ref 135–145)

## 2016-02-01 LAB — CBC
HEMATOCRIT: 32.7 % — AB (ref 36.0–46.0)
HEMOGLOBIN: 10.8 g/dL — AB (ref 12.0–15.0)
MCH: 31.1 pg (ref 26.0–34.0)
MCHC: 33 g/dL (ref 30.0–36.0)
MCV: 94.2 fL (ref 78.0–100.0)
Platelets: 97 10*3/uL — ABNORMAL LOW (ref 150–400)
RBC: 3.47 MIL/uL — ABNORMAL LOW (ref 3.87–5.11)
RDW: 16.1 % — ABNORMAL HIGH (ref 11.5–15.5)
WBC: 6.5 10*3/uL (ref 4.0–10.5)

## 2016-02-01 LAB — PROTEIN, BODY FLUID

## 2016-02-01 LAB — BODY FLUID CELL COUNT WITH DIFFERENTIAL
Eos, Fluid: 0 %
Lymphs, Fluid: 12 %
Monocyte-Macrophage-Serous Fluid: 74 % (ref 50–90)
Neutrophil Count, Fluid: 14 % (ref 0–25)
Total Nucleated Cell Count, Fluid: 909 cu mm (ref 0–1000)

## 2016-02-01 LAB — ALBUMIN, FLUID (OTHER): Albumin, Fluid: 1 g/dL

## 2016-02-01 LAB — LACTATE DEHYDROGENASE, PLEURAL OR PERITONEAL FLUID: LD, Fluid: 78 U/L — ABNORMAL HIGH (ref 3–23)

## 2016-02-01 LAB — GLUCOSE, SEROUS FLUID: GLUCOSE FL: 141 mg/dL

## 2016-02-01 MED ORDER — POTASSIUM CHLORIDE CRYS ER 20 MEQ PO TBCR
40.0000 meq | EXTENDED_RELEASE_TABLET | Freq: Once | ORAL | Status: AC
  Administered 2016-02-01: 40 meq via ORAL
  Filled 2016-02-01: qty 2

## 2016-02-01 NOTE — Progress Notes (Signed)
Speech Language Pathology Treatment: Dysphagia  Patient Details Name: Tiffany [name] MRN: [id number] DOB: [birth year] Today's Date: 02/01/2016 Time: [id number] SLP Time Calculation (min) (ACUTE ONLY): 10 min  Assessment / Plan / Recommendation Clinical Impression  Pt reports great tolerance of po intake.  She denies excessive dyspnea with intake but does admit to exertion dyspnea.  SLP observed pt consuming cheese, applesauce and water.  Functional swallow apparent with no indications of dysphagia.  Educated pt to alternatives for large pill administration and provided pt/spouse with heimlich maneuver in writing.  Advise dpt to be aware of breathing and rest prn.  Recommend advance to regular if GI approves, no follow up.    HPI HPI: Pt is an 82 y.o. female with PMH of hypertension, chronic systolic heart failure, atrial fibrillation, arthritis, dementia (on Namenda), seasonal allergies, Raynaud's disease, GERD, prior GIB in 02/2015 thought to be related to a Dieulafoy lesion and persistent anemia s/p colonoscopy / capsule endoscopy that were unrevealing for anemia source who presented to [facility] on 2/17 after a near syncopal episode and vomiting up blood.Was intubated upon admission 2/17, extubated this a.m. CXR 2/18 post-extubation showed interval mild pulmonary vascular congestion with minimal interstitial pulmonary edema. Since extubation pt has had frequent hemoptysis mucous/ blood mix. Bedside swallow eval ordered to assess swallow function post-extubation. EGD showed possible esophageal varices/ multiple bands.       SLP Plan  All goals met     Recommendations  Diet recommendations: Thin liquid;Regular Liquids provided via: Cup;Straw Medication Administration: Whole meds with liquid (large pills with puree - ) Supervision: Patient able to self feed Compensations: Slow rate;Small sips/bites Postural Changes and/or Swallow Maneuvers: Seated upright 90 degrees;Upright 30-60 min after meal            Plan: All goals met     [place], [place] [facility] SLP [phone]

## 2016-02-01 NOTE — Progress Notes (Signed)
[facility] Progress Note   CLORIS AUST  [id number]  DOB: July 27, 1929  DOA: 01/27/2016 PCP: Kennyth Arnold, FNP  Brief narrative:  82 year old female with a past medical history of hypertension, chronic systolic heart failure, atrial fibrillation, arthritis, dementia (on Namenda), seasonal allergies, Raynaud's disease, GERD, prior GIB in 02/2015 thought to be related to a Dieulafoy lesion and persistent anemia s/p colonoscopy / capsule endoscopy that were unrevealing for anemia source who presented to [facility] on 2/17 after a near syncopal episode and vomiting up blood.  Admitted by PCCM as she required intubation post endoscopy, endoscopy noted for esophagus and proximal stomach full of blood, and clots in the fundus, which appears to be distal esophageal varices bleeding and multiple bands were placed  Subjective: No complaints of abdominal pain nausea vomiting diarrhea cough or dyspnea. No bloody stools.  Assessment/Plan: Principal Problem:   Hematemesis-  acute blood loss anemia -Upper GI bleed secondary to a softball varices -EGD performed with blood noted to be coming from lower esophagus-banding done on 2/17 with resolution of bleeding -Status post 2 units of packed red blood cells on 2/17 -Hemoglobin has been steady -Octreotide and Protonix infusions have been discontinued -Started on solid food today-monitor overnight for further bleeding -Has been started on Inderal-metoprolol discontinued  Active Problems: Cirrhosis of the liver -New diagnosis-etiology uncertain - Child Pugh class A -Hepatitis panel is negative -Outpatient follow-up with GI  Chronic thrombocytopenia -Likely secondary to cirrhosis  Chronic anemia -Possibly due to iron deficiency as she is on oral iron as outpatient  Acute respiratory failure/right-sided pleural effusion - Large right-sided pleural effusion likely secondary to heart failure which persisted despite IV Lasix -Thoracentesis  performed today by pulmonary team-fluid appears to be transudate of  - wean oxygen as able  Chronic combined systolic and diastolic heart failure -Last Echo was performed on 03/06/15 which showed an EF of 40-45% and grade 1 diastolic dysfunction -She is on beta blocker, and Lasix but not on an ACE inhibitor - We'll consider starting an ACE inhibitor on discharge as long as she is not hypotensive  Hypokalemia -Replaced-likely secondary to Lasix   Antibiotics: Anti-infectives    None     Code Status:     Code Status Orders        Start     Ordered   01/27/16 1611  Limited resuscitation (code)   Continuous    Question Answer Comment  In the event of cardiac or respiratory ARREST: Initiate Code Blue, Call Rapid Response Yes   In the event of cardiac or respiratory ARREST: Perform CPR No   In the event of cardiac or respiratory ARREST: Perform Intubation/Mechanical Ventilation Yes   In the event of cardiac or respiratory ARREST: Use NIPPV/BiPAp only if indicated No   In the event of cardiac or respiratory ARREST: Administer ACLS medications if indicated No   In the event of cardiac or respiratory ARREST: Perform Defibrillation or Cardioversion if indicated No      01/27/16 1613    Code Status History    Date Active Date Inactive Code Status Order ID Comments User Context   08/26/2015  1:30 AM 08/26/2015  4:23 PM Full Code [id number]  Rise Patience, MD Inpatient   06/23/2015 11:13 PM 06/28/2015  5:40 PM Full Code [id number]  Etta Quill, DO ED   03/01/2015  2:01 PM 03/12/2015  8:13 PM Full Code [id number]  Rush Farmer, MD Inpatient    Advance Directive Documentation  Most Recent Value   Type of Advance Directive  Healthcare Power of Attorney, Living will   Pre-existing out of facility DNR order (yellow form or pink MOST form)     "MOST" Form in Place?       Family Communication: Husband and Daughter at bedside Disposition Plan: Possibly home tomorrow DVT  prophylaxis: SCDs Consultants: GI, pulmonary critical care Procedures: Thoracentesis, EGD    Objective: Filed Weights   01/30/16 0210 01/31/16 0600 02/01/16 0700  Weight: 69.2 kg (152 lb 8.9 oz) 69.2 kg (152 lb 8.9 oz) 72 kg (158 lb 11.7 oz)    Intake/Output Summary (Last 24 hours) at 02/01/16 1702 Last data filed at 02/01/16 0656  Gross per 24 hour  Intake    546 ml  Output    600 ml  Net    -54 ml     Vitals Filed Vitals:   01/31/16 1747 01/31/16 2200 02/01/16 0700 02/01/16 1415  BP: 138/82 150/67 122/51 128/62  Pulse: 82 85 64 62  Temp: 97.8 F (36.6 C) 98.2 F (36.8 C) 98.4 F (36.9 C) 98.1 F (36.7 C)  TempSrc: Oral Oral Oral Oral  Resp: 18 18 18 18   Height:      Weight:   72 kg (158 lb 11.7 oz)   SpO2: 96% 90% 90% 92%    Exam:  General:  Pt is alert, not in acute distress  HEENT: No icterus, No thrush, oral mucosa moist  Cardiovascular: regular rate and rhythm, S1/S2 No murmur  Respiratory: clear to auscultation bilaterally   Abdomen: Soft, +Bowel sounds, non tender, non distended, no guarding  MSK: No cyanosis or clubbing- no pedal edema   Data Reviewed: Basic Metabolic Panel:  Recent Labs Lab 01/28/16 0510 01/29/16 0453 01/30/16 0438 01/31/16 0500 02/01/16 0419  NA 150* 143 146* 146* 145  K 4.2 3.5 3.8 3.4* 3.3*  CL 118* 113* 117* 116* 112*  CO2 23 24 24 24 24   GLUCOSE 140* 124* 119* 120* 122*  BUN 43* 33* 22* 15 16  CREATININE 1.06* 0.84 0.86 0.75 0.92  CALCIUM 8.6* 8.1* 8.5* 8.4* 8.4*  MG 1.9  --   --   --   --   PHOS 4.7*  --   --   --   --    Liver Function Tests:  Recent Labs Lab 01/27/16 1124 01/31/16 0500  AST 23 34  ALT 14 23  ALKPHOS 103 83  BILITOT 0.8 0.7  PROT 6.0* 5.5*  ALBUMIN 3.7 3.1*    Recent Labs Lab 01/27/16 1124  LIPASE 31   No results for input(s): AMMONIA in the last 168 hours. CBC:  Recent Labs Lab 01/27/16 1124  01/29/16 0453 01/29/16 1700 01/30/16 0438 01/31/16 0735 02/01/16 0419   WBC 10.6*  < > 8.3 6.9 7.1 6.6 6.5  NEUTROABS 9.3*  --   --   --   --   --   --   HGB 10.7*  < > 9.4* 9.7* 9.9* 10.5* 10.8*  HCT 34.5*  < > 29.0* 29.3* 30.7* 32.8* 32.7*  MCV 93.5  < > 95.1 93.6 95.6 95.3 94.2  PLT 111*  < > 58* 60* 73* 93* 97*  < > = values in this interval not displayed. Cardiac Enzymes:  Recent Labs Lab 01/27/16 1124 01/27/16 1655 01/27/16 2235 01/28/16 0510  TROPONINI 0.03 0.09* 0.16* 0.08*   BNP (last 3 results)  Recent Labs  03/01/15 1800 03/08/15 0407 01/27/16 1124  BNP 55.6 567.5* 75.5  ProBNP (last 3 results)  Recent Labs  03/31/15 1247  PROBNP 265.0*    CBG:  Recent Labs Lab 01/28/16 0752 01/28/16 1147 01/28/16 1657  GLUCAP 130* 73 174*    Recent Results (from the past 240 hour(s))  MRSA PCR Screening     Status: Abnormal   Collection Time: 01/27/16  3:13 PM  Result Value Ref Range Status   MRSA by PCR POSITIVE (A) NEGATIVE Final    Comment:        The GeneXpert MRSA Assay (FDA approved for NASAL specimens only), is one component of a comprehensive MRSA colonization surveillance program. It is not intended to diagnose MRSA infection nor to guide or monitor treatment for MRSA infections. RESULT CALLED TO, READ BACK BY AND VERIFIED WITH: L.FREI AT [id number] ON 01/27/16 BY S.VANHOORNE   Body fluid culture     Status: None (Preliminary result)   Collection Time: 02/01/16 11:18 AM  Result Value Ref Range Status   Specimen Description PLEURAL  Final   Special Requests NONE  Final   Gram Stain   Final    ABUNDANT WBC PRESENT,BOTH PMN AND MONONUCLEAR NO ORGANISMS SEEN Performed at [facility]    Culture PENDING  Incomplete   Report Status PENDING  Incomplete     Studies: Ct Abd Wo & W Cm  01/31/2016  CLINICAL DATA:  Cirrhosis.  CHF.  Inpatient. EXAM: CT ABDOMEN WITHOUT AND WITH CONTRAST TECHNIQUE: Multidetector CT imaging of the abdomen was performed following the standard protocol before and following the bolus  administration of intravenous contrast. CONTRAST:  129mL OMNIPAQUE IOHEXOL 300 MG/ML  SOLN COMPARISON:  01/27/2016 abdominal sonogram. 03/08/2015 high-resolution chest CT study. 03/01/2015 CT abdomen/pelvis. FINDINGS: Lower chest: Partially visualized right pleural effusion, at least moderate to large in size. Small layering left pleural effusion. Complete right lower lobe atelectasis. Segmental right middle lobe atelectasis. Compressive atelectasis in the dependent left lower lobe. Partially visualized is the tip of a superior approach central venous catheter in the middle third of the superior vena cava. Mild cardiomegaly and coronary atherosclerosis. Trace pericardial fluid/thickening. Fluid throughout the mildly patulous lower thoracic esophagus. Hepatobiliary: Diffusely irregular liver contour, keeping with cirrhosis. Stable granulomatous calcification in the left liver lobe. No arterial phase foci of hyperenhancement in the liver. No liver mass. Mild gallbladder distention. There is a 3 mm density in the dependent gallbladder lumen, which could represent a tiny gallstone (series 3/ image 45). There is mild-to-moderate diffuse gallbladder wall thickening with trace pericholecystic fluid. No biliary ductal dilatation. Pancreas: Normal, with no mass or duct dilation. Spleen: Mild splenomegaly (splenic dimensions 16.0 x 8.2 cm axially and 10.3 cm craniocaudal, for a splenic volume of 703 cc), increased since 03/01/2015. No splenic mass. Adrenals/Urinary Tract: Normal adrenals. No hydronephrosis. Simple 1.2 cm renal cyst in the lower left kidney. Simple 2.4 cm renal cyst in the posterior interpolar left kidney. Several additional subcentimeter hypodense renal cortical lesions in both kidneys are too small to characterize. Stomach/Bowel: Grossly normal stomach. Visualized small and large bowel is normal caliber, with no bowel wall thickening. Mild scattered diverticulosis in the visualize colon. Vascular/Lymphatic:  Atherosclerotic nonaneurysmal abdominal aorta. Patent portal, splenic, hepatic and renal veins. Mild gastroesophageal varices. No pathologically enlarged lymph nodes in the abdomen. Other: No pneumoperitoneum, ascites or focal fluid collection. Musculoskeletal: No aggressive appearing focal osseous lesions. Marked degenerative changes in the visualized thoracolumbar spine. Bilateral posterior spinal fusion hardware in the lower lumbar spine, with no gross evidence of hardware fracture or loosening.  IMPRESSION: 1. Cirrhosis.  No liver mass. 2. Patent portal, splenic and hepatic veins. 3. Mild splenomegaly, increased. Stable mild gastroesophageal varices. No ascites. 4. Mildly distended gallbladder with mild to moderate diffuse gallbladder wall thickening and trace pericholecystic fluid. Possible tiny gallstone. These findings are nonspecific. The gallbladder wall thickening could be due to non-inflammatory edema such as due to hypoalbuminemia, although acute cholecystitis cannot be excluded on the basis of this examination. If there is clinical concern for acute cholecystitis, consider correlation with hepatobiliary scintigraphy. 5. Moderate to large right and small left pleural effusions with associated compressive atelectasis. 6. Mild cardiomegaly. Coronary atherosclerosis. Trace pericardial fluid/thickening. 7. Fluid in the lower thoracic esophagus, suggesting esophageal dysmotility and/or gastroesophageal reflux. Electronically Signed   By: Ilona Sorrel M.D.   On: 01/31/2016 13:18   Dg Chest Port 1 View  02/01/2016  CLINICAL DATA:  Status post thoracentesis EXAM: PORTABLE CHEST 1 VIEW COMPARISON:  02/01/2016 FINDINGS: Small residual pleural effusion on the right significantly decreased after thoracentesis. No pneumothorax identified. Calcified granuloma over the lingula stable. Mild cardiac enlargement. IMPRESSION: No pneumothorax status post thoracentesis Electronically Signed   By: Skipper Cliche M.D.   On:  02/01/2016 12:52   Dg Chest Port 1 View  02/01/2016  CLINICAL DATA:  Pleural effusion EXAM: PORTABLE CHEST 1 VIEW COMPARISON:  January 28, 2016 FINDINGS: There is a large pleural effusion on the right, significantly increased from most recent prior study. A smaller effusion is noted on the left which appears somewhat loculated. No well-defined edema or consolidation is appreciable. A calcified granuloma in the left mid lung region is stable. Heart is borderline enlarged with pulmonary vascularity within normal limits. No adenopathy. There is degenerative change in the thoracic spine. Bones are osteoporotic. IMPRESSION: Sizable pleural effusion on the right, significantly increased from prior study. Apparent loculated effusion left base. No change in cardiac silhouette. Stable granuloma left mid lung. No airspace consolidation is appreciable, although effusion could mask underlying infiltrate. Electronically Signed   By: Lowella Grip III M.D.   On: 02/01/2016 07:17    Scheduled Meds:  Scheduled Meds: . memantine  28 mg Oral Daily  . pantoprazole (PROTONIX) IV  40 mg Intravenous Q12H  . propranolol  20 mg Oral Q12H   Continuous Infusions:   Time spent on care of this patient: 81 min   [place], MD 02/01/2016, 5:02 PM  LOS: 5 days   [facility] Office  [phone] Pager - Text Page per [email or web address] If 7PM-7AM, please contact night-coverage [email or web address]

## 2016-02-01 NOTE — Progress Notes (Signed)
SATURATION QUALIFICATIONS: (This note is used to comply with regulatory documentation for home oxygen)  Patient Saturations on Room Air at Rest = 88%  Patient Saturations on Room Air while Ambulating = %  Patient Saturations on  Liters of oxygen while Ambulating = %  Please briefly explain why patient needs home oxygen: Patient desats on room air at rest

## 2016-02-01 NOTE — Evaluation (Signed)
SATURATION QUALIFICATIONS: (This note is used to comply with regulatory documentation for home oxygen)  Patient Saturations on Room Air at Rest = 90%  Patient Saturations on Room Air while Ambulating = 88%  Patient Saturations on 2 Liters of oxygen while Ambulating = 94%  Please briefly explain why patient needs home oxygen: Patient has significant drop in oxygen saturation when ambulating.

## 2016-02-01 NOTE — Care Management Important Message (Signed)
Important Message  Patient Details Important Message  Patient Details IM Letter given to Nora/Case Manager to present to Patient Name: Tiffany [name] MRN: [id number] Date of Birth: [birth year]   Medicare Important Message Given:  Yes    Camillo Flaming 02/01/2016, 12:05 PM Name: Tiffany [name] MRN: [id number] Date of Birth: [birth year]   Medicare Important Message Given:  Yes    Camillo Flaming 02/01/2016, 12:04 PM

## 2016-02-01 NOTE — Care Management Note (Signed)
Case Management Note  Patient Details  Name: Tiffany [name] MRN: [id number] Date of Birth: 07-22-1929  Subjective/Objective:          82 yo admitted with GIB          Action/Plan: From home with husband.  Expected Discharge Date:  01/30/16               Expected Discharge Plan:  [place]  In-House Referral:     Discharge planning Services  CM Consult  Post Acute Care Choice:  Home Health Choice offered to:  Adult Children, Patient  DME Arranged:    DME Agency:     HH Arranged:  RN, PT, OT, Nurse's Aide [place] Agency:  [place]  Status of Service:  Completed, signed off  Medicare Important Message Given:    Date Medicare IM Given:    Medicare IM give by:    Date Additional Medicare IM Given:    Additional Medicare Important Message give by:     If discussed at [place] of Stay Meetings, dates discussed:    Additional Comments: This CM met with pt, husband and daughter Sherron Flemings about discharge plans. Pt is needing some help at home and would like [facility] services.  PT is recommending HHPT.  Choice was offered for home health services and [facility] was chosen.  [facility] rep called for referral.  MD orders will be needed for HHRN/PT/OT/Aide. Private duty list was also left with family, and it was explained that private duty was private pay.  No equipment needs were expressed or recommended by PT.  RN to do desaturation screen and to alert this CM of home 02 needed.  Lynnell Catalan, RN 02/01/2016, 11:15 AM

## 2016-02-01 NOTE — Progress Notes (Signed)
Subjective: No hematemesis. Having some dark stool. No abdominal pain; tolerating diet.  Objective: Vital signs in last 24 hours: Temp:  [97.8 F (36.6 C)-98.4 F (36.9 C)] 98.4 F (36.9 C) (02/22 0700) Pulse Rate:  [64-85] 64 (02/22 0700) Resp:  [18-21] 18 (02/22 0700) BP: (122-150)/(51-82) 122/51 mmHg (02/22 0700) SpO2:  [90 %-96 %] 90 % (02/22 0700) Weight:  [72 kg (158 lb 11.7 oz)] 72 kg (158 lb 11.7 oz) (02/22 0700) Weight change: 2.8 kg (6 lb 2.8 oz) Last BM Date: 01/30/16  PE: GEN:  NAD, upbeat, younger-appearing than stated age ABD:  Soft, non-tender.  Lab Results: CBC    Component Value Date/Time   WBC 6.5 02/01/2016 0419   WBC 6.9 07/21/2015 1310   RBC 3.47* 02/01/2016 0419   RBC 4.05 07/21/2015 1310   HGB 10.8* 02/01/2016 0419   HGB 11.7 07/21/2015 1310   HCT 32.7* 02/01/2016 0419   HCT 38.6 07/21/2015 1310   PLT 97* 02/01/2016 0419   PLT 117* 07/21/2015 1310   MCV 94.2 02/01/2016 0419   MCV 95.3 07/21/2015 1310   MCH 31.1 02/01/2016 0419   MCH 28.9 07/21/2015 1310   MCHC 33.0 02/01/2016 0419   MCHC 30.3* 07/21/2015 1310   RDW 16.1* 02/01/2016 0419   RDW 15.4* 07/21/2015 1310   LYMPHSABS 0.9 01/27/2016 1124   LYMPHSABS 1.2 07/21/2015 1310   MONOABS 0.3 01/27/2016 1124   MONOABS 0.7 07/21/2015 1310   EOSABS 0.1 01/27/2016 1124   EOSABS 0.2 07/21/2015 1310   BASOSABS 0.0 01/27/2016 1124   BASOSABS 0.0 07/21/2015 1310   CMP     Component Value Date/Time   NA 145 02/01/2016 0419   NA 146* 07/21/2015 1310   K 3.3* 02/01/2016 0419   K 4.3 07/21/2015 1310   CL 112* 02/01/2016 0419   CO2 24 02/01/2016 0419   CO2 31* 07/21/2015 1310   GLUCOSE 122* 02/01/2016 0419   GLUCOSE 100 07/21/2015 1310   BUN 16 02/01/2016 0419   BUN 40.7* 07/21/2015 1310   CREATININE 0.92 02/01/2016 0419   CREATININE 1.4* 07/21/2015 1310   CALCIUM 8.4* 02/01/2016 0419   CALCIUM 9.1 07/21/2015 1310   PROT 5.5* 01/31/2016 0500   PROT 6.9 07/21/2015 1310   ALBUMIN 3.1*  01/31/2016 0500   ALBUMIN 3.9 07/21/2015 1310   AST 34 01/31/2016 0500   AST 32 07/21/2015 1310   ALT 23 01/31/2016 0500   ALT 24 07/21/2015 1310   ALKPHOS 83 01/31/2016 0500   ALKPHOS 169* 07/21/2015 1310   BILITOT 0.7 01/31/2016 0500   BILITOT 0.29 07/21/2015 1310   GFRNONAA 56* 02/01/2016 0419   GFRAA >60 02/01/2016 0419   CT scan:  Cirrhosis changes, no obvious liver mass or portal vascular clot/obstruction; mild splenomegaly; R > L pleural effusions.  Assessment:  1. Variceal bleeding, resolved with banding. No evidence of ongoing acute GI tract bleeding. 2. Evidence of cirrhosis on abdominal ultrasound. 3. Black stool, likely passage of old blood. 4. Acute on chronic anemia. 5.  Pleural effusions.  Could be hepatic hydrothorax or from volume in setting of heart failure.  Given lack of significant ascites, suspect volume in setting of chronic heart failure.  Plan:  1.  Propranolol good option for varices, and is seemingly going to be utilized from cardiac/rate standpoint as well. 2.  Advance diet as tolerated. 3.  OK to discharge home today from GI perspective. 4.  Will arrange outpatient GI follow-up in 2-3 weeks. 5.  Will probably need repeat  endoscopy with further variceal banding in a few weeks as well.   Landry Dyke 02/01/2016, 9:34 AM   Pager [phone] If no answer or after 5 PM call [phone]

## 2016-02-01 NOTE — Progress Notes (Signed)
PULMONARY / CRITICAL CARE MEDICINE   Name: Tiffany [name] MRN: [id number] DOB: November 17, 1929    ADMISSION DATE:  01/27/2016 CONSULTATION DATE:  01/27/16  REFERRING MD:  Dr. Amedeo Plenty  CHIEF COMPLAINT:  GIB, Intubated for Airway Protection  HISTORY OF PRESENT ILLNESS:  82 year old female with a past medical history of hypertension, chronic systolic heart failure, atrial fibrillation, arthritis, dementia (on Namenda), seasonal allergies, Raynaud's disease, GERD, prior GIB in 02/2015 thought to be related to a Dieulafoy lesion and persistent anemia s/p colonoscopy / capsule endoscopy that were unrevealing for anemia source who presented to [facility] on 2/17 after a near syncopal episode and vomiting up blood.   -->EGD: 2/17 varices multiple banding  -->Moved out of ICU 2/21; CT abd completed to better define Cirrhosis -->PCCM asked to see again on 2/21 d/t incidental finding of mod-->large right effusion.   SUBJECTIVE:  Does still have cough   VITAL SIGNS: BP 122/51 mmHg  Pulse 64  Temp(Src) 98.4 F (36.9 C) (Oral)  Resp 18  Ht 5\' 1"  (1.549 m)  Wt 158 lb 11.7 oz (72 kg)  BMI 30.01 kg/m2  SpO2 90% 2 liters   INTAKE / OUTPUT: I/O last 3 completed shifts: In: 2276 [P.O.:680; I.V.:1596] Out: 2300 [Urine:2300]  PHYSICAL EXAMINATION: General:  Elderly female in NAD  Neuro:  Awake, oriented, generalized weakness. No focal def   HEENT: MMM, no JVd neck dressing CD&I Cardiovascular:  s1s2 rrr, no m/r/g Lungs:  Even/non-labored, decreased right > left. Occasional rhonchi  Abdomen:  Soft, non-distented Musculoskeletal:  No acute deformities  Skin:  Warm/dry, no edema   LABS:  BMET  Recent Labs Lab 01/30/16 0438 01/31/16 0500 02/01/16 0419  NA 146* 146* 145  K 3.8 3.4* 3.3*  CL 117* 116* 112*  CO2 24 24 24   BUN 22* 15 16  CREATININE 0.86 0.75 0.92  GLUCOSE 119* 120* 122*    Electrolytes  Recent Labs Lab 01/28/16 0510  01/30/16 0438 01/31/16 0500 02/01/16 0419  CALCIUM  8.6*  < > 8.5* 8.4* 8.4*  MG 1.9  --   --   --   --   PHOS 4.7*  --   --   --   --   < > = values in this interval not displayed.  CBC  Recent Labs Lab 01/30/16 0438 01/31/16 0735 02/01/16 0419  WBC 7.1 6.6 6.5  HGB 9.9* 10.5* 10.8*  HCT 30.7* 32.8* 32.7*  PLT 73* 93* 97*    Coag's  Recent Labs Lab 01/27/16 1124  INR 1.25      Imaging Ct Abd Wo & W Cm  01/31/2016  CLINICAL DATA:  Cirrhosis.  CHF.  Inpatient. EXAM: CT ABDOMEN WITHOUT AND WITH CONTRAST TECHNIQUE: Multidetector CT imaging of the abdomen was performed following the standard protocol before and following the bolus administration of intravenous contrast. CONTRAST:  174mL OMNIPAQUE IOHEXOL 300 MG/ML  SOLN COMPARISON:  01/27/2016 abdominal sonogram. 03/08/2015 high-resolution chest CT study. 03/01/2015 CT abdomen/pelvis. FINDINGS: Lower chest: Partially visualized right pleural effusion, at least moderate to large in size. Small layering left pleural effusion. Complete right lower lobe atelectasis. Segmental right middle lobe atelectasis. Compressive atelectasis in the dependent left lower lobe. Partially visualized is the tip of a superior approach central venous catheter in the middle third of the superior vena cava. Mild cardiomegaly and coronary atherosclerosis. Trace pericardial fluid/thickening. Fluid throughout the mildly patulous lower thoracic esophagus. Hepatobiliary: Diffusely irregular liver contour, keeping with cirrhosis. Stable granulomatous calcification in the left liver lobe.  No arterial phase foci of hyperenhancement in the liver. No liver mass. Mild gallbladder distention. There is a 3 mm density in the dependent gallbladder lumen, which could represent a tiny gallstone (series 3/ image 45). There is mild-to-moderate diffuse gallbladder wall thickening with trace pericholecystic fluid. No biliary ductal dilatation. Pancreas: Normal, with no mass or duct dilation. Spleen: Mild splenomegaly (splenic dimensions  16.0 x 8.2 cm axially and 10.3 cm craniocaudal, for a splenic volume of 703 cc), increased since 03/01/2015. No splenic mass. Adrenals/Urinary Tract: Normal adrenals. No hydronephrosis. Simple 1.2 cm renal cyst in the lower left kidney. Simple 2.4 cm renal cyst in the posterior interpolar left kidney. Several additional subcentimeter hypodense renal cortical lesions in both kidneys are too small to characterize. Stomach/Bowel: Grossly normal stomach. Visualized small and large bowel is normal caliber, with no bowel wall thickening. Mild scattered diverticulosis in the visualize colon. Vascular/Lymphatic: Atherosclerotic nonaneurysmal abdominal aorta. Patent portal, splenic, hepatic and renal veins. Mild gastroesophageal varices. No pathologically enlarged lymph nodes in the abdomen. Other: No pneumoperitoneum, ascites or focal fluid collection. Musculoskeletal: No aggressive appearing focal osseous lesions. Marked degenerative changes in the visualized thoracolumbar spine. Bilateral posterior spinal fusion hardware in the lower lumbar spine, with no gross evidence of hardware fracture or loosening. IMPRESSION: 1. Cirrhosis.  No liver mass. 2. Patent portal, splenic and hepatic veins. 3. Mild splenomegaly, increased. Stable mild gastroesophageal varices. No ascites. 4. Mildly distended gallbladder with mild to moderate diffuse gallbladder wall thickening and trace pericholecystic fluid. Possible tiny gallstone. These findings are nonspecific. The gallbladder wall thickening could be due to non-inflammatory edema such as due to hypoalbuminemia, although acute cholecystitis cannot be excluded on the basis of this examination. If there is clinical concern for acute cholecystitis, consider correlation with hepatobiliary scintigraphy. 5. Moderate to large right and small left pleural effusions with associated compressive atelectasis. 6. Mild cardiomegaly. Coronary atherosclerosis. Trace pericardial fluid/thickening. 7.  Fluid in the lower thoracic esophagus, suggesting esophageal dysmotility and/or gastroesophageal reflux. Electronically Signed   By: Ilona Sorrel M.D.   On: 01/31/2016 13:18   Dg Chest Port 1 View  02/01/2016  CLINICAL DATA:  Pleural effusion EXAM: PORTABLE CHEST 1 VIEW COMPARISON:  January 28, 2016 FINDINGS: There is a large pleural effusion on the right, significantly increased from most recent prior study. A smaller effusion is noted on the left which appears somewhat loculated. No well-defined edema or consolidation is appreciable. A calcified granuloma in the left mid lung region is stable. Heart is borderline enlarged with pulmonary vascularity within normal limits. No adenopathy. There is degenerative change in the thoracic spine. Bones are osteoporotic. IMPRESSION: Sizable pleural effusion on the right, significantly increased from prior study. Apparent loculated effusion left base. No change in cardiac silhouette. Stable granuloma left mid lung. No airspace consolidation is appreciable, although effusion could mask underlying infiltrate. Electronically Signed   By: Lowella Grip III M.D.   On: 02/01/2016 07:17   CT abd w/ large right effusion   STUDIES:  2/17 EGD >> ? esophageal varices, multiple bands  CULTURES:   ANTIBIOTICS: Vanco 2/17 >> off Zosyn 2/17 >> off   SIGNIFICANT EVENTS: 2/17  Admit with hematemesis   LINES/TUBES: ETT 2/17 >> 2/19 R IJ TLC 2/17 >> out   DISCUSSION: 82 y/o F with PMH of Dieulafoy lesion, mild dementia, AF, chronic sCHF, & HTN admitted with hematemesis thought to be related to esophageal varices (not present on prior EGD < 1 year ago).  Now w/ evidence of  cirrhosis and portal HTN. Source remains unclear. She is doing well. CT of abd did incidentally identify new mod/large right effusion. This is most likely a result of volume resuscitation efforts in a pt w/ known cardiomyopathy as well as newly diagnosed liver disease. Will cont lasix  antihypertensive rx. Have changed her lopressor to propranolol to address probable portal HTN in addition to her HTn and systolic disease. Will evaluate right chest w/ [place] and plan on therapeutic and diagnostic thora given loculated appearance of fluid.    ASSESSMENT / PLAN:  Right Pleural effusion. Initially felt that this was more r/t volume and systolic HF; BUT radiographically it does appear loculated  Plan:   Cont Lasix Will d/w Dr [place] thoracentesis-->given loculated appearance of fluid  F/u AM CXR    Hx HTN, PAF, Chronic sCHF (EF 40-45%) Plan:  Cont lasix  Will switch her to non-selective BB to treat both Propranolol 20mg  bid  DNR in the event of cardiac arrest  Acute GIB - s/p EGD 2/17 per Dr. Amedeo Plenty with esophageal varices w/ new dx of Cirrhosis -->GIB resolved s/p banding.  Portal HTN (clinically) Plan:   Cont diet  Cont PPI BID Added non-selective BB GI arranging f/u  Hypokalemia  Plan Replace and recheck   Hx Dementia  Plan:   Resumed home Namenda, remeron;   Erick Colace ACNP-BC [place] Pager # [phone] OR # [phone] if no answer

## 2016-02-02 DIAGNOSIS — K7460 Unspecified cirrhosis of liver: Secondary | ICD-10-CM

## 2016-02-02 DIAGNOSIS — I8510 Secondary esophageal varices without bleeding: Secondary | ICD-10-CM

## 2016-02-02 DIAGNOSIS — K7469 Other cirrhosis of liver: Secondary | ICD-10-CM

## 2016-02-02 DIAGNOSIS — I8501 Esophageal varices with bleeding: Principal | ICD-10-CM

## 2016-02-02 DIAGNOSIS — I5032 Chronic diastolic (congestive) heart failure: Secondary | ICD-10-CM

## 2016-02-02 LAB — BASIC METABOLIC PANEL
ANION GAP: 8 (ref 5–15)
BUN: 18 mg/dL (ref 6–20)
CALCIUM: 8.4 mg/dL — AB (ref 8.9–10.3)
CHLORIDE: 115 mmol/L — AB (ref 101–111)
CO2: 24 mmol/L (ref 22–32)
Creatinine, Ser: 0.97 mg/dL (ref 0.44–1.00)
GFR calc non Af Amer: 52 mL/min — ABNORMAL LOW (ref >60)
Glucose, Bld: 101 mg/dL — ABNORMAL HIGH (ref 65–99)
POTASSIUM: 3.9 mmol/L (ref 3.5–5.1)
Sodium: 147 mmol/L — ABNORMAL HIGH (ref 135–145)

## 2016-02-02 LAB — CBC
HCT: 32.6 % — ABNORMAL LOW (ref 36.0–46.0)
HEMOGLOBIN: 10.4 g/dL — AB (ref 12.0–15.0)
MCH: 30.2 pg (ref 26.0–34.0)
MCHC: 31.9 g/dL (ref 30.0–36.0)
MCV: 94.8 fL (ref 78.0–100.0)
Platelets: 104 10*3/uL — ABNORMAL LOW (ref 150–400)
RBC: 3.44 MIL/uL — AB (ref 3.87–5.11)
RDW: 16.4 % — ABNORMAL HIGH (ref 11.5–15.5)
WBC: 6 10*3/uL (ref 4.0–10.5)

## 2016-02-02 LAB — LACTATE DEHYDROGENASE: LDH: 162 U/L (ref 98–192)

## 2016-02-02 LAB — PROTEIN, TOTAL: Total Protein: 5.4 g/dL — ABNORMAL LOW (ref 6.5–8.1)

## 2016-02-02 MED ORDER — PROPRANOLOL HCL 20 MG PO TABS: 20.0000 mg | ORAL_TABLET | Freq: Two times a day (BID) | ORAL | Status: AC

## 2016-02-02 NOTE — Progress Notes (Signed)
SATURATION QUALIFICATIONS: (This note is used to comply with regulatory documentation for home oxygen)  Patient Saturations on Room Air at Rest = 97%  Patient Saturations on Room Air while Ambulating = 79%  Patient Saturations on  Liters of oxygen while Ambulating =  Please briefly explain why patient needs home oxygen:

## 2016-02-02 NOTE — Discharge Summary (Signed)
Physician Discharge Summary  Tiffany [name] [id number] DOB: 01-24-1929 DOA: 01/27/2016  PCP: Kennyth Arnold, FNP  Admit date: 01/27/2016 Discharge date: 02/02/2016  Time spent:50 minutes  Recommendations for Outpatient Follow-up:  1. Repeat O2 screen in 1-2 wks to determine if O2 can be weaned off 2. Consider starting ACE I for systolic CHF if BP tolerates   Discharge Condition: stable    Discharge Diagnoses:  Principal Problem:   GIB (gastrointestinal bleeding) Active Problems:   Acute blood loss anemia   Pleural effusion   Alzheimer's dementia   Essential hypertension   Chronic systolic and diastolic heart failure (HCC)   Cirrhosis (HCC)   History of present illness:  82 year old female with a past medical history of hypertension, chronic systolic heart failure, atrial fibrillation, arthritis, dementia (on Namenda), seasonal allergies, Raynaud's disease, GERD, prior GIB in 02/2015 thought to be related to a Dieulafoy lesion and persistent anemia s/p colonoscopy / capsule endoscopy who presented to [facility] on 2/17 after vomiting up blood and nearly passing out.  Admitted by PCCM as she required intubation post endoscopy.    Hospital Course:  Principal Problem:  Hematemesis- acute blood loss anemia -Upper GI bleed secondary to esophageal varices -EGD performed on 2/17 with blood noted to be coming from lower esophagus-banding done with resolution of bleeding -Status post 2 units of packed red blood cells on 2/17 -Octreotide and Protonix infusions have been discontinued -Started on solid food and monitored overnight for further bleeding- no bleeding noted- Hb stableat 10-1/0 range for past 3 days -Has been started on Inderal to decrease portal HTN -metoprolol discontinued  Active Problems: Cirrhosis of the liver -New diagnosis- etiology uncertain-Hepatitis panel is negative - Child Pugh class A -Outpatient follow-up with GI to further determined etiology - no ascites  or hepatic encephalopathy noted as of yet   Chronic thrombocytopenia -Likely secondary to cirrhosis  Chronic anemia -Possibly due to iron deficiency as she is on oral iron as outpatient  Acute respiratory failure/right-sided pleural effusion - Large right-sided pleural effusion likely secondary to heart failure which persisted despite attempting to diurese with IV Lasix -Thoracentesis performed on 2/22 by pulmonary team- repeat CXR after procedure revealed only a small residual effusion without any pulm edema or underlying infiltrates- she continues to be slightly hypoxic on exertion - this may be secondary to atelectasis due to being bedbound for about 1 wk- she will go home with supplementa O2 which should be weaned off by her PCP - pleural fluid appears to be transudate and was likely secondary to IV fluids given during initial resusitation    Chronic combined systolic and diastolic heart failure -Last Echo was performed on 03/06/15 which showed an EF of 40-45% and grade 1 diastolic dysfunction -She is on beta blocker, and Lasix but not on an ACE inhibitor -  consider starting an ACE inhibitor as outpt if BP reasonable- BP is low this AM therefore, have not started ACE I  Hypokalemia -Replaced-likely secondary to Lasix   Procedures:  EGD 2/17  Consultations:  GI  Discharge Exam: Filed Weights   01/31/16 0600 02/01/16 0700 02/02/16 0433  Weight: 69.2 kg (152 lb 8.9 oz) 72 kg (158 lb 11.7 oz) 65.2 kg (143 lb 11.8 oz)   Filed Vitals:   02/02/16 0433  BP: 116/45  Pulse: 61  Temp: 98.4 F (36.9 C)  Resp: 18    General: AAO x 3, no distress Cardiovascular: RRR, no murmurs  Respiratory: clear to auscultation bilaterally GI: soft, non-tender,  non-distended, bowel sound positive  Discharge Instructions You were cared for by a hospitalist during your hospital stay. If you have any questions about your discharge medications or the care you received while you were in the  hospital after you are discharged, you can call the unit and asked to speak with the hospitalist on call if the hospitalist that took care of you is not available. Once you are discharged, your primary care physician will handle any further medical issues. Please note that NO REFILLS for any discharge medications will be authorized once you are discharged, as it is imperative that you return to your primary care physician (or establish a relationship with a primary care physician if you do not have one) for your aftercare needs so that they can reassess your need for medications and monitor your lab values.      Discharge Instructions    (HEART FAILURE PATIENTS) Call MD:  Anytime you have any of the following symptoms: 1) 3 pound weight gain in 24 hours or 5 pounds in 1 week 2) shortness of breath, with or without a dry hacking cough 3) swelling in the hands, feet or stomach 4) if you have to sleep on extra pillows at night in order to breathe.    Complete by:  As directed      AMB Referral to [facility]    Complete by:  As directed   Please assign to [facility]. Active with [facility] currently. Consents already in system. Please call with questions. Marthenia Rolling, [place], RN,BSN-[facility] [id number]  Reason for consult:  Please assign to [facility] RNCM  Expected date of contact:  1-3 days (reserved for hospital discharges)     Diet - low sodium heart healthy    Complete by:  As directed      Increase activity slowly    Complete by:  As directed             Medication List    STOP taking these medications        acetaminophen 500 MG tablet  Commonly known as:  TYLENOL     metoprolol succinate 25 MG 24 hr tablet  Commonly known as:  TOPROL-XL      TAKE these medications        ferrous sulfate 325 (65 FE) MG tablet  Take 325 mg by mouth daily with breakfast.     fluticasone 50 MCG/ACT nasal spray  Commonly known as:  FLONASE  Place  2 sprays into both nostrils daily.     furosemide 40 MG tablet  Commonly known as:  LASIX  Take 0.5 tablets (20 mg total) by mouth daily.     mirtazapine 15 MG tablet  Commonly known as:  REMERON  Take 1 tablet (15 mg total) by mouth at bedtime.     NAMENDA XR 28 MG Cp24 24 hr capsule  Generic drug:  memantine  TAKE 1 CAPSULE (28 MG TOTAL) BY MOUTH DAILY.     pantoprazole 40 MG tablet  Commonly known as:  PROTONIX  Take 1 tablet (40 mg total) by mouth 2 (two) times daily.     PEPCID PO  Take 1 tablet by mouth daily as needed (heartburn). Reported on 01/02/2016     Potassium Chloride ER 20 MEQ Tbcr  TAKE 1 TABLET (20 MEQ TOTAL) BY MOUTH DAILY.     propranolol 20 MG tablet  Commonly known as:  INDERAL  Take 1 tablet (20 mg total)  by mouth every 12 (twelve) hours.       No Known Allergies    The results of significant diagnostics from this hospitalization (including imaging, microbiology, ancillary and laboratory) are listed below for reference.    Significant Diagnostic Studies: Dg Chest 1 View  01/27/2016  CLINICAL DATA:  Acute respiratory arrest with intubation. Current history of hypertension, atrial fibrillation, chronic systolic and diastolic heart failure. EXAM: Portable CHEST 1 VIEW 3:11 p.m.: COMPARISON:  06/23/2015 and earlier. FINDINGS: Endotracheal tube tip is at the carina. Cardiac silhouette mildly to moderately enlarged, unchanged. Dense consolidation in the left lower lobe silhouetting the left hemidiaphragm, associated with a possible left pleural effusion. Pulmonary vascularity normal without evidence of pulmonary edema. Lungs otherwise clear. No right pleural effusion. IMPRESSION: 1. Endotracheal tube tip is at the carina. It should be withdrawn approximately 4-5 cm. 2. Left lower lobe pneumonia with a possible left parapneumonic effusion. 3. Stable mild to moderate cardiomegaly without pulmonary edema. I telephoned these results at the time of interpretation on  01/27/2016 at 3:36 pm to Lattie Haw, the nurse caring for the patient in the ICU, who verbally acknowledged these results. Electronically Signed   By: Evangeline Dakin M.D.   On: 01/27/2016 15:37   Ct Abd Wo & W Cm  01/31/2016  CLINICAL DATA:  Cirrhosis.  CHF.  Inpatient. EXAM: CT ABDOMEN WITHOUT AND WITH CONTRAST TECHNIQUE: Multidetector CT imaging of the abdomen was performed following the standard protocol before and following the bolus administration of intravenous contrast. CONTRAST:  132mL OMNIPAQUE IOHEXOL 300 MG/ML  SOLN COMPARISON:  01/27/2016 abdominal sonogram. 03/08/2015 high-resolution chest CT study. 03/01/2015 CT abdomen/pelvis. FINDINGS: Lower chest: Partially visualized right pleural effusion, at least moderate to large in size. Small layering left pleural effusion. Complete right lower lobe atelectasis. Segmental right middle lobe atelectasis. Compressive atelectasis in the dependent left lower lobe. Partially visualized is the tip of a superior approach central venous catheter in the middle third of the superior vena cava. Mild cardiomegaly and coronary atherosclerosis. Trace pericardial fluid/thickening. Fluid throughout the mildly patulous lower thoracic esophagus. Hepatobiliary: Diffusely irregular liver contour, keeping with cirrhosis. Stable granulomatous calcification in the left liver lobe. No arterial phase foci of hyperenhancement in the liver. No liver mass. Mild gallbladder distention. There is a 3 mm density in the dependent gallbladder lumen, which could represent a tiny gallstone (series 3/ image 45). There is mild-to-moderate diffuse gallbladder wall thickening with trace pericholecystic fluid. No biliary ductal dilatation. Pancreas: Normal, with no mass or duct dilation. Spleen: Mild splenomegaly (splenic dimensions 16.0 x 8.2 cm axially and 10.3 cm craniocaudal, for a splenic volume of 703 cc), increased since 03/01/2015. No splenic mass. Adrenals/Urinary Tract: Normal adrenals. No  hydronephrosis. Simple 1.2 cm renal cyst in the lower left kidney. Simple 2.4 cm renal cyst in the posterior interpolar left kidney. Several additional subcentimeter hypodense renal cortical lesions in both kidneys are too small to characterize. Stomach/Bowel: Grossly normal stomach. Visualized small and large bowel is normal caliber, with no bowel wall thickening. Mild scattered diverticulosis in the visualize colon. Vascular/Lymphatic: Atherosclerotic nonaneurysmal abdominal aorta. Patent portal, splenic, hepatic and renal veins. Mild gastroesophageal varices. No pathologically enlarged lymph nodes in the abdomen. Other: No pneumoperitoneum, ascites or focal fluid collection. Musculoskeletal: No aggressive appearing focal osseous lesions. Marked degenerative changes in the visualized thoracolumbar spine. Bilateral posterior spinal fusion hardware in the lower lumbar spine, with no gross evidence of hardware fracture or loosening. IMPRESSION: 1. Cirrhosis.  No liver mass. 2. Patent portal,  splenic and hepatic veins. 3. Mild splenomegaly, increased. Stable mild gastroesophageal varices. No ascites. 4. Mildly distended gallbladder with mild to moderate diffuse gallbladder wall thickening and trace pericholecystic fluid. Possible tiny gallstone. These findings are nonspecific. The gallbladder wall thickening could be due to non-inflammatory edema such as due to hypoalbuminemia, although acute cholecystitis cannot be excluded on the basis of this examination. If there is clinical concern for acute cholecystitis, consider correlation with hepatobiliary scintigraphy. 5. Moderate to large right and small left pleural effusions with associated compressive atelectasis. 6. Mild cardiomegaly. Coronary atherosclerosis. Trace pericardial fluid/thickening. 7. Fluid in the lower thoracic esophagus, suggesting esophageal dysmotility and/or gastroesophageal reflux. Electronically Signed   By: Ilona Sorrel M.D.   On: 01/31/2016  13:18   Dg Chest Port 1 View  02/01/2016  CLINICAL DATA:  Status post thoracentesis EXAM: PORTABLE CHEST 1 VIEW COMPARISON:  02/01/2016 FINDINGS: Small residual pleural effusion on the right significantly decreased after thoracentesis. No pneumothorax identified. Calcified granuloma over the lingula stable. Mild cardiac enlargement. IMPRESSION: No pneumothorax status post thoracentesis Electronically Signed   By: Skipper Cliche M.D.   On: 02/01/2016 12:52   Dg Chest Port 1 View  02/01/2016  CLINICAL DATA:  Pleural effusion EXAM: PORTABLE CHEST 1 VIEW COMPARISON:  January 28, 2016 FINDINGS: There is a large pleural effusion on the right, significantly increased from most recent prior study. A smaller effusion is noted on the left which appears somewhat loculated. No well-defined edema or consolidation is appreciable. A calcified granuloma in the left mid lung region is stable. Heart is borderline enlarged with pulmonary vascularity within normal limits. No adenopathy. There is degenerative change in the thoracic spine. Bones are osteoporotic. IMPRESSION: Sizable pleural effusion on the right, significantly increased from prior study. Apparent loculated effusion left base. No change in cardiac silhouette. Stable granuloma left mid lung. No airspace consolidation is appreciable, although effusion could mask underlying infiltrate. Electronically Signed   By: Lowella Grip III M.D.   On: 02/01/2016 07:17   Dg Chest Port 1 View  01/28/2016  CLINICAL DATA:  Status post extubation.  Near syncope 1 day ago. EXAM: PORTABLE CHEST 1 VIEW COMPARISON:  Earlier today. FINDINGS: The endotracheal tube has been removed. Stable right jugular catheter. The cardiac silhouette remains borderline enlarged. Stable oval calcification overlying the heart and left lower lung zone. Minimal increase in prominence of the pulmonary vasculature and interstitial markings. Old, healed right rib fractures. Thoracolumbar spine  degenerative changes. IMPRESSION: 1. Interval mild pulmonary vascular congestion and minimal interstitial pulmonary edema. 2. Stable borderline cardiomegaly. Electronically Signed   By: Claudie Revering M.D.   On: 01/28/2016 10:29   Dg Chest Port 1 View  01/28/2016  CLINICAL DATA:  Acute respiratory failure EXAM: PORTABLE CHEST 1 VIEW COMPARISON:  01/27/2016 FINDINGS: Right jugular venous catheter and endotracheal tube are stable. Left basilar consolidation has improved. Right basilar atelectasis has increased with lower lung volumes. No pneumothorax. IMPRESSION: Improved left basilar consolidation. Increasing right basilar atelectasis. Electronically Signed   By: Marybelle Killings M.D.   On: 01/28/2016 08:17   Dg Chest Port 1 View  01/27/2016  CLINICAL DATA:  Central line placement. EXAM: PORTABLE CHEST 1 VIEW COMPARISON:  01/27/2016 at 1511 hours FINDINGS: The endotracheal tube has been withdrawn. Tip now projects 3.5 cm above the carina. Right internal jugular central venous line tip projects the lower superior vena cava. No pneumothorax Left lung base consolidation is without change previous exam. No new lung abnormalities. IMPRESSION: 1. Endotracheal tube  now well positioned with its tip 3.5 cm above the carina. 2. New right internal jugular central venous line tip lies in the lower superior vena cava. No pneumothorax. Electronically Signed   By: Lajean Manes M.D.   On: 01/27/2016 16:55   US Abdomen Limited Ruq  01/27/2016  CLINICAL DATA:  82 year old female with nausea and vomiting. EXAM: US ABDOMEN LIMITED - RIGHT UPPER QUADRANT COMPARISON:  CT dated 03/01/2015 FINDINGS: Gallbladder: There is diffuse edematous appearance and thickening of the gallbladder wall. The gallbladder wall measures approximately 11 mm in thickness. There is no gallstone or pericholecystic fluid. Evaluation for Murphy's sign was limited as the patient is vented. Common bile duct: Diameter: 4 mm. Liver: The liver demonstrates a  heterogeneous echotexture. IMPRESSION: Diffuse thickening of the gallbladder wall may be related to underlying cirrhosis or represent chronic cholecystitis. An acalculous cholecystitis is not excluded. Clinical correlation is recommended. Heterogeneous liver secondary to underlying cirrhosis. Electronically Signed   By: Anner Crete M.D.   On: 01/27/2016 18:58    Microbiology: Recent Results (from the past 240 hour(s))  MRSA PCR Screening     Status: Abnormal   Collection Time: 01/27/16  3:13 PM  Result Value Ref Range Status   MRSA by PCR POSITIVE (A) NEGATIVE Final    Comment:        The GeneXpert MRSA Assay (FDA approved for NASAL specimens only), is one component of a comprehensive MRSA colonization surveillance program. It is not intended to diagnose MRSA infection nor to guide or monitor treatment for MRSA infections. RESULT CALLED TO, READ BACK BY AND VERIFIED WITH: L.FREI AT [id number] ON 01/27/16 BY S.VANHOORNE   Body fluid culture     Status: None (Preliminary result)   Collection Time: 02/01/16 11:18 AM  Result Value Ref Range Status   Specimen Description PLEURAL  Final   Special Requests NONE  Final   Gram Stain   Final    ABUNDANT WBC PRESENT,BOTH PMN AND MONONUCLEAR NO ORGANISMS SEEN Performed at [facility]    Culture PENDING  Incomplete   Report Status PENDING  Incomplete     Labs: Basic Metabolic Panel:  Recent Labs Lab 01/28/16 0510 01/29/16 0453 01/30/16 0438 01/31/16 0500 02/01/16 0419 02/02/16 0405  NA 150* 143 146* 146* 145 147*  K 4.2 3.5 3.8 3.4* 3.3* 3.9  CL 118* 113* 117* 116* 112* 115*  CO2 23 24 24 24 24 24   GLUCOSE 140* 124* 119* 120* 122* 101*  BUN 43* 33* 22* 15 16 18   CREATININE 1.06* 0.84 0.86 0.75 0.92 0.97  CALCIUM 8.6* 8.1* 8.5* 8.4* 8.4* 8.4*  MG 1.9  --   --   --   --   --   PHOS 4.7*  --   --   --   --   --    Liver Function Tests:  Recent Labs Lab 01/27/16 1124 01/31/16 0500 02/02/16 0405  AST 23 34  --    ALT 14 23  --   ALKPHOS 103 83  --   BILITOT 0.8 0.7  --   PROT 6.0* 5.5* 5.4*  ALBUMIN 3.7 3.1*  --     Recent Labs Lab 01/27/16 1124  LIPASE 31   No results for input(s): AMMONIA in the last 168 hours. CBC:  Recent Labs Lab 01/27/16 1124  01/29/16 1700 01/30/16 0438 01/31/16 0735 02/01/16 0419 02/02/16 0405  WBC 10.6*  < > 6.9 7.1 6.6 6.5 6.0  NEUTROABS 9.3*  --   --   --   --   --   --  HGB 10.7*  < > 9.7* 9.9* 10.5* 10.8* 10.4*  HCT 34.5*  < > 29.3* 30.7* 32.8* 32.7* 32.6*  MCV 93.5  < > 93.6 95.6 95.3 94.2 94.8  PLT 111*  < > 60* 73* 93* 97* 104*  < > = values in this interval not displayed. Cardiac Enzymes:  Recent Labs Lab 01/27/16 1124 01/27/16 1655 01/27/16 2235 01/28/16 0510  TROPONINI 0.03 0.09* 0.16* 0.08*   BNP: BNP (last 3 results)  Recent Labs  03/01/15 1800 03/08/15 0407 01/27/16 1124  BNP 55.6 567.5* 75.5    ProBNP (last 3 results)  Recent Labs  03/31/15 1247  PROBNP 265.0*    CBG:  Recent Labs Lab 01/28/16 0752 01/28/16 1147 01/28/16 1657  GLUCAP 130* 73 174*       SignedDebbe Odea, MD [facility] 02/02/2016, 12:10 PM

## 2016-02-02 NOTE — Procedures (Addendum)
Thoracentesis Procedure Note (LATE ENTRY, WAS PERFORMED ON 2/22 but I was pulled away emergently for another patient and could not write note)  Pre-operative Diagnosis: Pleural effusion  Post-operative Diagnosis: same  Indications: enlarging pleural effusion  Procedure Details  Consent: Informed consent was obtained. Risks of the procedure were discussed including: infection, bleeding, pain, pneumothorax.  Under sterile conditions the patient was positioned. Betadine solution and sterile drapes were utilized.  1% buffered lidocaine was used to anesthetize the 8th rib space. Fluid was obtained without any difficulties and minimal blood loss.  A dressing was applied to the wound and wound care instructions were provided.   Findings 1500 ml of clear pleural fluid was obtained. A sample was sent to Pathology for cytogenetics, flow, and cell counts, as well as for infection analysis.  Complications:  None; patient tolerated the procedure well.          Condition: stable  Plan A follow up chest x-ray was ordered. Bed Rest for 0 hours. Tylenol 650 mg. for pain.  Attending Attestation: I performed the procedure.  Roselie Awkward, MD [place] P[facility]M Pager: [phone] Cell: [phone] After 3pm or if no response, call [phone]

## 2016-02-02 NOTE — Progress Notes (Signed)
[facility] PCCM  S: Feels well O:  Filed Vitals:   02/01/16 0700 02/01/16 1415 02/01/16 2155 02/02/16 0433  BP: 122/51 128/62 77/51 116/45  Pulse: 64 62 67 61  Temp: 98.4 F (36.9 C) 98.1 F (36.7 C) 98.7 F (37.1 C) 98.4 F (36.9 C)  TempSrc: Oral Oral Oral Oral  Resp: 18 18 18 18   Height:      Weight: 72 kg (158 lb 11.7 oz)   65.2 kg (143 lb 11.8 oz)  SpO2: 90% 92% 90% 94%   RA  Gen: elderly but well appearing HENT: OP clear, neck supple PULM: CTA B, normal percussion CV: RRR, no mgr, trace edema GI: BS+, soft, nontender Derm: no cyanosis or rash Psyche: normal mood and affect  2/22 CXR images reviewed> small residual R effusion after thoracentesis, no pneumothorax or pneumonia  Pleural fluid labs reviewed, fluid protein < 3.0 and albumin 1  Impression/Plan:  Pleural fluid> transudate.  Suspect this was due to crystalloid resuscitation in the setting of GI bleed with underlying cirrhosis and +/- diastolic heart failure.  She doesn't appear to be in decompensated heart failure now so I suspect the liver is more at play here.  This is similar to the process in 02/2015 when she had a GI bleed and needed a thoracentesis.  That was a transudate as well and attributed to volume resuscitation. > Keep net even > repeat CXR as outpatient  > diurese if the effusion recurs   PCCM will sign off  Tiffany Awkward, MD [place] P[facility]M Pager: [phone] Cell: [phone] After 3pm or if no response, call [phone]

## 2016-02-02 NOTE — Progress Notes (Signed)
SATURATION QUALIFICATIONS: (This note is used to comply with regulatory documentation for home oxygen)  Patient Saturations on Room Air at Rest = 97%  Patient Saturations on Room Air while Ambulating = 79%  Patient Saturations on 0 Liters of oxygen while Ambulating = 79%  Please briefly explain why patient needs home oxygen:  Patient desats when walking on room air

## 2016-02-02 NOTE — Progress Notes (Signed)
Per Desaturation screen done by nursing pt does qualify for home 02.  Order placed by MD.  [facility] DME rep contacted for home 02 referral. Marney Doctor RN,BSN,NCM [phone]

## 2016-02-03 ENCOUNTER — Encounter: Payer: Self-pay | Admitting: *Deleted

## 2016-02-03 ENCOUNTER — Other Ambulatory Visit: Payer: Self-pay | Admitting: *Deleted

## 2016-02-03 NOTE — Patient Outreach (Signed)
[facility]) Care Management  02/03/2016  Tiffany [name] September 19, 1929 [id number]  Assessment: Transition of care- week 1 Referral received from hospital liaison Lonn [place]) for transition of care. Patient was previously active with [place] coach (D. Dia Sitter) prior to hospitalization. 82 year old female  with recent admission to hospital on 2/17- 02/02/16 with gastrointestinal bleeding (nearly passing out), blood loss anemia and congestive heart failure.  Call placed and spoke with a lady who identified herself as patient's daughter/ caregiver/ Healthcare power of attorney (Tiffany [name]- retired Marine scientist). Daughter states that she is the primary contact for patient since patient has Alzheimer's dementia and does not understand what is being told most times. Patient is forgetful as well. Daughter reports that patient has hard of hearing and unable to hear good, mostly requires constant repeating of what was said. Patient can get short of breath when talking and is using 2 liters of oxygen at present.   According to daughter, patient use to care for herself prior to hospitalization. Patient lives with her 82 year old husband. Daughter is supportive of their needs. Since patient is still weak after discharge from the hospital, daughter has planned to hire a nurse aide from [employer] for 4 hours a day to assist with patient's activities of daily living and light housework (bathing, dressing, cooking, laundry and transportation, etc.) and also having housekeeper to come twice instead of once a month, as stated by daughter.    Transition of care call completed with daughter. No further bleeding reported.  Daughter expressed good understanding of medications and hospital stay. Patient's medications managed by daughter.  Post hospital follow-up appointment will be arranged by daughter today as mentioned. Transportation is being provided by daughter as stated.  According to  patient's daughter, she is very much aware of what to monitor regarding patient's health condition.   She denies of immediate needs at this time. She agreed to transition of care call next week. Encouraged to call [facility] management coordinator or 24-hour nurse line if needed. Daughter has [facility] information provided.   Plan: Transition of care call 02/07/16. Will set-up initial home visit if daughter agrees.  Dak Szumski A. Ariez Neilan, BSN, RN-BC [facility] Management Coordinator Cell: [phone]

## 2016-02-04 ENCOUNTER — Inpatient Hospital Stay (HOSPITAL_COMMUNITY): Payer: PPO

## 2016-02-04 ENCOUNTER — Encounter (HOSPITAL_COMMUNITY): Payer: Self-pay | Admitting: Oncology

## 2016-02-04 ENCOUNTER — Encounter (HOSPITAL_COMMUNITY): Admission: EM | Disposition: E | Payer: Self-pay | Source: Home / Self Care | Attending: Internal Medicine

## 2016-02-04 ENCOUNTER — Inpatient Hospital Stay (HOSPITAL_COMMUNITY)
Admission: EM | Admit: 2016-02-04 | Discharge: 2016-03-10 | DRG: 432 | Disposition: E | Payer: PPO | Attending: Internal Medicine | Admitting: Internal Medicine

## 2016-02-04 ENCOUNTER — Inpatient Hospital Stay (HOSPITAL_COMMUNITY): Payer: PPO | Admitting: Anesthesiology

## 2016-02-04 DIAGNOSIS — D62 Acute posthemorrhagic anemia: Secondary | ICD-10-CM | POA: Diagnosis not present

## 2016-02-04 DIAGNOSIS — Z66 Do not resuscitate: Secondary | ICD-10-CM | POA: Diagnosis present

## 2016-02-04 DIAGNOSIS — K766 Portal hypertension: Secondary | ICD-10-CM | POA: Diagnosis present

## 2016-02-04 DIAGNOSIS — K7469 Other cirrhosis of liver: Secondary | ICD-10-CM | POA: Diagnosis present

## 2016-02-04 DIAGNOSIS — I7300 Raynaud's syndrome without gangrene: Secondary | ICD-10-CM | POA: Diagnosis present

## 2016-02-04 DIAGNOSIS — K7460 Unspecified cirrhosis of liver: Secondary | ICD-10-CM | POA: Diagnosis present

## 2016-02-04 DIAGNOSIS — K922 Gastrointestinal hemorrhage, unspecified: Secondary | ICD-10-CM | POA: Diagnosis not present

## 2016-02-04 DIAGNOSIS — I10 Essential (primary) hypertension: Secondary | ICD-10-CM | POA: Diagnosis present

## 2016-02-04 DIAGNOSIS — J9600 Acute respiratory failure, unspecified whether with hypoxia or hypercapnia: Secondary | ICD-10-CM | POA: Diagnosis present

## 2016-02-04 DIAGNOSIS — J80 Acute respiratory distress syndrome: Secondary | ICD-10-CM | POA: Diagnosis not present

## 2016-02-04 DIAGNOSIS — E878 Other disorders of electrolyte and fluid balance, not elsewhere classified: Secondary | ICD-10-CM | POA: Diagnosis present

## 2016-02-04 DIAGNOSIS — F0280 Dementia in other diseases classified elsewhere without behavioral disturbance: Secondary | ICD-10-CM | POA: Diagnosis present

## 2016-02-04 DIAGNOSIS — J302 Other seasonal allergic rhinitis: Secondary | ICD-10-CM | POA: Diagnosis present

## 2016-02-04 DIAGNOSIS — Z79899 Other long term (current) drug therapy: Secondary | ICD-10-CM | POA: Diagnosis not present

## 2016-02-04 DIAGNOSIS — I4891 Unspecified atrial fibrillation: Secondary | ICD-10-CM | POA: Diagnosis present

## 2016-02-04 DIAGNOSIS — Z515 Encounter for palliative care: Secondary | ICD-10-CM | POA: Diagnosis not present

## 2016-02-04 DIAGNOSIS — E870 Hyperosmolality and hypernatremia: Secondary | ICD-10-CM | POA: Diagnosis present

## 2016-02-04 DIAGNOSIS — J90 Pleural effusion, not elsewhere classified: Secondary | ICD-10-CM | POA: Diagnosis present

## 2016-02-04 DIAGNOSIS — Z8249 Family history of ischemic heart disease and other diseases of the circulatory system: Secondary | ICD-10-CM

## 2016-02-04 DIAGNOSIS — Z8041 Family history of malignant neoplasm of ovary: Secondary | ICD-10-CM | POA: Diagnosis not present

## 2016-02-04 DIAGNOSIS — I248 Other forms of acute ischemic heart disease: Secondary | ICD-10-CM | POA: Diagnosis present

## 2016-02-04 DIAGNOSIS — N179 Acute kidney failure, unspecified: Secondary | ICD-10-CM | POA: Diagnosis present

## 2016-02-04 DIAGNOSIS — K219 Gastro-esophageal reflux disease without esophagitis: Secondary | ICD-10-CM | POA: Diagnosis present

## 2016-02-04 DIAGNOSIS — Z978 Presence of other specified devices: Secondary | ICD-10-CM

## 2016-02-04 DIAGNOSIS — J849 Interstitial pulmonary disease, unspecified: Secondary | ICD-10-CM | POA: Diagnosis present

## 2016-02-04 DIAGNOSIS — E860 Dehydration: Secondary | ICD-10-CM | POA: Diagnosis present

## 2016-02-04 DIAGNOSIS — Z800 Family history of malignant neoplasm of digestive organs: Secondary | ICD-10-CM | POA: Diagnosis not present

## 2016-02-04 DIAGNOSIS — I5042 Chronic combined systolic (congestive) and diastolic (congestive) heart failure: Secondary | ICD-10-CM | POA: Diagnosis present

## 2016-02-04 DIAGNOSIS — E872 Acidosis: Secondary | ICD-10-CM | POA: Diagnosis present

## 2016-02-04 DIAGNOSIS — R0602 Shortness of breath: Secondary | ICD-10-CM | POA: Insufficient documentation

## 2016-02-04 DIAGNOSIS — I8511 Secondary esophageal varices with bleeding: Secondary | ICD-10-CM | POA: Diagnosis present

## 2016-02-04 DIAGNOSIS — M1990 Unspecified osteoarthritis, unspecified site: Secondary | ICD-10-CM | POA: Diagnosis present

## 2016-02-04 DIAGNOSIS — G309 Alzheimer's disease, unspecified: Secondary | ICD-10-CM

## 2016-02-04 DIAGNOSIS — I959 Hypotension, unspecified: Secondary | ICD-10-CM | POA: Diagnosis present

## 2016-02-04 DIAGNOSIS — Z8261 Family history of arthritis: Secondary | ICD-10-CM | POA: Diagnosis not present

## 2016-02-04 DIAGNOSIS — K920 Hematemesis: Secondary | ICD-10-CM | POA: Diagnosis present

## 2016-02-04 DIAGNOSIS — I8510 Secondary esophageal varices without bleeding: Secondary | ICD-10-CM | POA: Diagnosis present

## 2016-02-04 DIAGNOSIS — D72829 Elevated white blood cell count, unspecified: Secondary | ICD-10-CM | POA: Diagnosis present

## 2016-02-04 HISTORY — DX: Unspecified cirrhosis of liver: K74.60

## 2016-02-04 LAB — CBC
HCT: 41 % (ref 36.0–46.0)
HEMATOCRIT: 34.1 % — AB (ref 36.0–46.0)
HEMATOCRIT: 28 % — AB (ref 36.0–46.0)
HEMATOCRIT: 41.9 % (ref 36.0–46.0)
HEMOGLOBIN: 10.9 g/dL — AB (ref 12.0–15.0)
Hemoglobin: 9 g/dL — ABNORMAL LOW (ref 12.0–15.0)
Hemoglobin: 13.6 g/dL (ref 12.0–15.0)
Hemoglobin: 13 g/dL (ref 12.0–15.0)
MCH: 30.2 pg (ref 26.0–34.0)
MCH: 30.6 pg (ref 26.0–34.0)
MCH: 30.1 pg (ref 26.0–34.0)
MCH: 29.5 pg (ref 26.0–34.0)
MCHC: 32 g/dL (ref 30.0–36.0)
MCHC: 32.1 g/dL (ref 30.0–36.0)
MCHC: 32.5 g/dL (ref 30.0–36.0)
MCHC: 31.7 g/dL (ref 30.0–36.0)
MCV: 94.5 fL (ref 78.0–100.0)
MCV: 95.2 fL (ref 78.0–100.0)
MCV: 92.7 fL (ref 78.0–100.0)
MCV: 93.2 fL (ref 78.0–100.0)
PLATELETS: 167 10*3/uL (ref 150–400)
Platelets: 235 10*3/uL (ref 150–400)
Platelets: 116 10*3/uL — ABNORMAL LOW (ref 150–400)
Platelets: 277 10*3/uL (ref 150–400)
RBC: 3.61 MIL/uL — ABNORMAL LOW (ref 3.87–5.11)
RBC: 2.94 MIL/uL — ABNORMAL LOW (ref 3.87–5.11)
RBC: 4.52 MIL/uL (ref 3.87–5.11)
RBC: 4.4 MIL/uL (ref 3.87–5.11)
RDW: 16.3 % — AB (ref 11.5–15.5)
RDW: 16.3 % — AB (ref 11.5–15.5)
RDW: 16.4 % — AB (ref 11.5–15.5)
RDW: 16.6 % — ABNORMAL HIGH (ref 11.5–15.5)
WBC: 13.3 10*3/uL — ABNORMAL HIGH (ref 4.0–10.5)
WBC: 6.9 10*3/uL (ref 4.0–10.5)
WBC: 26 10*3/uL — ABNORMAL HIGH (ref 4.0–10.5)
WBC: 20.9 10*3/uL — ABNORMAL HIGH (ref 4.0–10.5)

## 2016-02-04 LAB — BLOOD GAS, ARTERIAL
ACID-BASE DEFICIT: 5.4 mmol/L — AB (ref 0.0–2.0)
ACID-BASE DEFICIT: 5.9 mmol/L — AB (ref 0.0–2.0)
BICARBONATE: 21 meq/L (ref 20.0–24.0)
BICARBONATE: 19.6 meq/L — AB (ref 20.0–24.0)
Drawn by: 276051
Drawn by: 422461
FIO2: 1
FIO2: 1
LHR: 12 {breaths}/min
LHR: 12 {breaths}/min
O2 SAT: 91.3 %
O2 Saturation: 98.1 %
PATIENT TEMPERATURE: 97.6
PCO2 ART: 39.6 mmHg (ref 35.0–45.0)
PEEP/CPAP: 5 cmH2O
PEEP/CPAP: 10 cmH2O
PH ART: 7.313 — AB (ref 7.350–7.450)
PO2 ART: 135 mmHg — AB (ref 80.0–100.0)
PO2 ART: 65.1 mmHg — AB (ref 80.0–100.0)
Patient temperature: 98.6
TCO2: 19.4 mmol/L (ref 0–100)
TCO2: 17.8 mmol/L (ref 0–100)
VT: 380 mL
VT: 380 mL
pCO2 arterial: 46.9 mmHg — ABNORMAL HIGH (ref 35.0–45.0)
pH, Arterial: 7.274 — ABNORMAL LOW (ref 7.350–7.450)

## 2016-02-04 LAB — APTT: aPTT: 27 seconds (ref 24–37)

## 2016-02-04 LAB — CBC WITH DIFFERENTIAL/PLATELET
BASOS ABS: 0.1 10*3/uL (ref 0.0–0.1)
Basophils Relative: 0 %
Eosinophils Absolute: 0.8 10*3/uL — ABNORMAL HIGH (ref 0.0–0.7)
Eosinophils Relative: 3 %
HEMATOCRIT: 42.3 % (ref 36.0–46.0)
HEMOGLOBIN: 13.7 g/dL (ref 12.0–15.0)
Lymphocytes Relative: 22 %
Lymphs Abs: 5.3 10*3/uL — ABNORMAL HIGH (ref 0.7–4.0)
MCH: 30.3 pg (ref 26.0–34.0)
MCHC: 32.4 g/dL (ref 30.0–36.0)
MCV: 93.6 fL (ref 78.0–100.0)
MONO ABS: 2 10*3/uL — AB (ref 0.1–1.0)
Monocytes Relative: 8 %
NEUTROS ABS: 15.9 10*3/uL — AB (ref 1.7–7.7)
NEUTROS PCT: 66 %
Platelets: 251 10*3/uL (ref 150–400)
RBC: 4.52 MIL/uL (ref 3.87–5.11)
RDW: 16.3 % — AB (ref 11.5–15.5)
WBC: 23.9 10*3/uL — ABNORMAL HIGH (ref 4.0–10.5)

## 2016-02-04 LAB — COMPREHENSIVE METABOLIC PANEL
ALBUMIN: 3.3 g/dL — AB (ref 3.5–5.0)
ALBUMIN: 2.9 g/dL — AB (ref 3.5–5.0)
ALK PHOS: 115 U/L (ref 38–126)
ALT: 16 U/L (ref 14–54)
ALT: 14 U/L (ref 14–54)
ANION GAP: 11 (ref 5–15)
ANION GAP: 9 (ref 5–15)
AST: 22 U/L (ref 15–41)
AST: 18 U/L (ref 15–41)
Alkaline Phosphatase: 101 U/L (ref 38–126)
BILIRUBIN TOTAL: 0.6 mg/dL (ref 0.3–1.2)
BUN: 23 mg/dL — ABNORMAL HIGH (ref 6–20)
BUN: 25 mg/dL — ABNORMAL HIGH (ref 6–20)
CALCIUM: 8.9 mg/dL (ref 8.9–10.3)
CALCIUM: 8 mg/dL — AB (ref 8.9–10.3)
CO2: 21 mmol/L — ABNORMAL LOW (ref 22–32)
CO2: 21 mmol/L — AB (ref 22–32)
Chloride: 117 mmol/L — ABNORMAL HIGH (ref 101–111)
Chloride: 115 mmol/L — ABNORMAL HIGH (ref 101–111)
Creatinine, Ser: 1.13 mg/dL — ABNORMAL HIGH (ref 0.44–1.00)
Creatinine, Ser: 0.97 mg/dL (ref 0.44–1.00)
GFR calc non Af Amer: 52 mL/min — ABNORMAL LOW (ref >60)
GFR, EST AFRICAN AMERICAN: 50 mL/min — AB (ref >60)
GFR, EST NON AFRICAN AMERICAN: 43 mL/min — AB (ref >60)
GLUCOSE: 142 mg/dL — AB (ref 65–99)
Glucose, Bld: 159 mg/dL — ABNORMAL HIGH (ref 65–99)
POTASSIUM: 4 mmol/L (ref 3.5–5.1)
POTASSIUM: 4.3 mmol/L (ref 3.5–5.1)
SODIUM: 145 mmol/L (ref 135–145)
Sodium: 149 mmol/L — ABNORMAL HIGH (ref 135–145)
TOTAL PROTEIN: 5.9 g/dL — AB (ref 6.5–8.1)
TOTAL PROTEIN: 5.1 g/dL — AB (ref 6.5–8.1)
Total Bilirubin: 0.6 mg/dL (ref 0.3–1.2)

## 2016-02-04 LAB — URINALYSIS, ROUTINE W REFLEX MICROSCOPIC
Bilirubin Urine: NEGATIVE
GLUCOSE, UA: NEGATIVE mg/dL
HGB URINE DIPSTICK: NEGATIVE
Ketones, ur: NEGATIVE mg/dL
Leukocytes, UA: NEGATIVE
Nitrite: NEGATIVE
PROTEIN: NEGATIVE mg/dL
Specific Gravity, Urine: 1.028 (ref 1.005–1.030)
pH: 5.5 (ref 5.0–8.0)

## 2016-02-04 LAB — CBG MONITORING, ED: Glucose-Capillary: 130 mg/dL — ABNORMAL HIGH (ref 65–99)

## 2016-02-04 LAB — BODY FLUID CULTURE: CULTURE: NO GROWTH

## 2016-02-04 LAB — CREATININE, URINE, RANDOM: CREATININE, URINE: 158.54 mg/dL

## 2016-02-04 LAB — TROPONIN I: TROPONIN I: 0.1 ng/mL — AB (ref <0.031)

## 2016-02-04 LAB — PROTIME-INR
INR: 1.37 (ref 0.00–1.49)
INR: 1.27 (ref 0.00–1.49)
PROTHROMBIN TIME: 15.6 s — AB (ref 11.6–15.2)
Prothrombin Time: 16.5 seconds — ABNORMAL HIGH (ref 11.6–15.2)

## 2016-02-04 LAB — BRAIN NATRIURETIC PEPTIDE: B NATRIURETIC PEPTIDE 5: 80.8 pg/mL (ref 0.0–100.0)

## 2016-02-04 LAB — HEPATIC FUNCTION PANEL
ALT: 15 U/L (ref 14–54)
AST: 22 U/L (ref 15–41)
Albumin: 3.3 g/dL — ABNORMAL LOW (ref 3.5–5.0)
Alkaline Phosphatase: 105 U/L (ref 38–126)
BILIRUBIN INDIRECT: 0.7 mg/dL (ref 0.3–0.9)
Bilirubin, Direct: 0.2 mg/dL (ref 0.1–0.5)
TOTAL PROTEIN: 6.1 g/dL — AB (ref 6.5–8.1)
Total Bilirubin: 0.9 mg/dL (ref 0.3–1.2)

## 2016-02-04 LAB — GLUCOSE, CAPILLARY
GLUCOSE-CAPILLARY: 154 mg/dL — AB (ref 65–99)
Glucose-Capillary: 85 mg/dL (ref 65–99)

## 2016-02-04 LAB — LACTIC ACID, PLASMA: LACTIC ACID, VENOUS: 1.3 mmol/L (ref 0.5–2.0)

## 2016-02-04 LAB — PREPARE RBC (CROSSMATCH)

## 2016-02-04 SURGERY — ERCP, WITH INTERVENTION IF INDICATED
Anesthesia: General

## 2016-02-04 SURGERY — EGD (ESOPHAGOGASTRODUODENOSCOPY)
Anesthesia: Moderate Sedation

## 2016-02-04 MED ORDER — DEXTROSE 5 % IV SOLN
1.0000 g | Freq: Once | INTRAVENOUS | Status: AC
  Administered 2016-02-04: 1 g via INTRAVENOUS
  Filled 2016-02-04: qty 10

## 2016-02-04 MED ORDER — FENTANYL CITRATE (PF) 100 MCG/2ML IJ SOLN
INTRAMUSCULAR | Status: AC
  Filled 2016-02-04: qty 2

## 2016-02-04 MED ORDER — SODIUM CHLORIDE 0.9 % IV SOLN
50.0000 ug/h | INTRAVENOUS | Status: DC
  Administered 2016-02-04 (×2): 50 ug/h via INTRAVENOUS
  Administered 2016-02-04: 12:00:00 via INTRAVENOUS
  Administered 2016-02-05 – 2016-02-07 (×3): 50 ug/h via INTRAVENOUS
  Filled 2016-02-04 (×22): qty 1

## 2016-02-04 MED ORDER — FENTANYL CITRATE (PF) 100 MCG/2ML IJ SOLN
50.0000 ug | Freq: Once | INTRAMUSCULAR | Status: AC
  Administered 2016-02-04: 50 ug via INTRAVENOUS
  Filled 2016-02-04: qty 2

## 2016-02-04 MED ORDER — SODIUM CHLORIDE 0.9 % IV SOLN
Freq: Once | INTRAVENOUS | Status: AC
  Administered 2016-02-04: 12:00:00 via INTRAVENOUS

## 2016-02-04 MED ORDER — SUCCINYLCHOLINE CHLORIDE 20 MG/ML IJ SOLN
INTRAMUSCULAR | Status: DC | PRN
  Administered 2016-02-04: 100 mg via INTRAVENOUS

## 2016-02-04 MED ORDER — MIDAZOLAM HCL 5 MG/ML IJ SOLN
INTRAMUSCULAR | Status: AC
  Filled 2016-02-04: qty 2

## 2016-02-04 MED ORDER — CEFTRIAXONE SODIUM 1 G IJ SOLR
1.0000 g | INTRAMUSCULAR | Status: DC
  Administered 2016-02-05 – 2016-02-07 (×3): 1 g via INTRAVENOUS
  Filled 2016-02-04 (×3): qty 10

## 2016-02-04 MED ORDER — MIDAZOLAM HCL 2 MG/2ML IJ SOLN
2.0000 mg | INTRAMUSCULAR | Status: DC | PRN
  Administered 2016-02-05 (×3): 2 mg via INTRAVENOUS
  Filled 2016-02-04 (×4): qty 2

## 2016-02-04 MED ORDER — PANTOPRAZOLE SODIUM 40 MG IV SOLR
40.0000 mg | Freq: Two times a day (BID) | INTRAVENOUS | Status: DC
  Administered 2016-02-07 – 2016-02-09 (×4): 40 mg via INTRAVENOUS
  Filled 2016-02-04 (×4): qty 40

## 2016-02-04 MED ORDER — DEXTROSE-NACL 5-0.45 % IV SOLN
INTRAVENOUS | Status: AC
  Administered 2016-02-04: 05:00:00 via INTRAVENOUS

## 2016-02-04 MED ORDER — PROPOFOL 10 MG/ML IV BOLUS
INTRAVENOUS | Status: AC
  Filled 2016-02-04: qty 20

## 2016-02-04 MED ORDER — LIDOCAINE HCL (CARDIAC) 20 MG/ML IV SOLN
INTRAVENOUS | Status: DC | PRN
  Administered 2016-02-04: 100 mg via INTRAVENOUS

## 2016-02-04 MED ORDER — ETOMIDATE 2 MG/ML IV SOLN
INTRAVENOUS | Status: AC
  Filled 2016-02-04: qty 10

## 2016-02-04 MED ORDER — MORPHINE SULFATE (PF) 2 MG/ML IV SOLN: 2.0000 mg | INTRAVENOUS | Status: DC | PRN

## 2016-02-04 MED ORDER — LACTATED RINGERS IV SOLN
INTRAVENOUS | Status: DC | PRN
  Administered 2016-02-04: 10:00:00 via INTRAVENOUS

## 2016-02-04 MED ORDER — PROPOFOL 10 MG/ML IV BOLUS
INTRAVENOUS | Status: DC | PRN
  Administered 2016-02-04 (×3): 20 mg via INTRAVENOUS
  Administered 2016-02-04: 25 mg via INTRAVENOUS
  Administered 2016-02-04: 20 mg via INTRAVENOUS

## 2016-02-04 MED ORDER — INSULIN ASPART 100 UNIT/ML ~~LOC~~ SOLN
1.0000 [IU] | SUBCUTANEOUS | Status: DC
  Administered 2016-02-04: 2 [IU] via SUBCUTANEOUS
  Administered 2016-02-05 – 2016-02-06 (×6): 1 [IU] via SUBCUTANEOUS

## 2016-02-04 MED ORDER — SODIUM CHLORIDE 0.9 % IV SOLN
80.0000 mg | Freq: Once | INTRAVENOUS | Status: AC
  Administered 2016-02-04: 80 mg via INTRAVENOUS
  Filled 2016-02-04: qty 80

## 2016-02-04 MED ORDER — SODIUM CHLORIDE 0.9 % IV SOLN
INTRAVENOUS | Status: DC
  Administered 2016-02-04 – 2016-02-08 (×2): via INTRAVENOUS

## 2016-02-04 MED ORDER — SODIUM CHLORIDE 0.9 % IV SOLN
25.0000 ug/h | INTRAVENOUS | Status: DC
  Administered 2016-02-04: 50 ug/h via INTRAVENOUS
  Administered 2016-02-05: 125 ug/h via INTRAVENOUS
  Administered 2016-02-06: 100 ug/h via INTRAVENOUS
  Administered 2016-02-07: 175 ug/h via INTRAVENOUS
  Administered 2016-02-07: 125 ug/h via INTRAVENOUS
  Administered 2016-02-08: 175 ug/h via INTRAVENOUS
  Filled 2016-02-04 (×8): qty 50

## 2016-02-04 MED ORDER — FENTANYL CITRATE (PF) 100 MCG/2ML IJ SOLN: 25.0000 ug | INTRAMUSCULAR | Status: DC | PRN

## 2016-02-04 MED ORDER — METOPROLOL TARTRATE 1 MG/ML IV SOLN
2.5000 mg | INTRAVENOUS | Status: DC | PRN
  Administered 2016-02-06: 1 mg via INTRAVENOUS
  Filled 2016-02-04 (×2): qty 5

## 2016-02-04 MED ORDER — LIDOCAINE HCL (CARDIAC) 20 MG/ML IV SOLN
INTRAVENOUS | Status: AC
  Filled 2016-02-04: qty 5

## 2016-02-04 MED ORDER — CHLORHEXIDINE GLUCONATE 0.12% ORAL RINSE (MEDLINE KIT)
15.0000 mL | Freq: Two times a day (BID) | OROMUCOSAL | Status: DC
  Administered 2016-02-04: 15 mL via OROMUCOSAL

## 2016-02-04 MED ORDER — ANTISEPTIC ORAL RINSE SOLUTION (CORINZ)
7.0000 mL | Freq: Four times a day (QID) | OROMUCOSAL | Status: DC
  Administered 2016-02-05: 7 mL via OROMUCOSAL

## 2016-02-04 MED ORDER — ONDANSETRON HCL 4 MG PO TABS: 4.0000 mg | ORAL_TABLET | Freq: Three times a day (TID) | ORAL | Status: DC | PRN

## 2016-02-04 MED ORDER — HYDROXYZINE HCL 50 MG/ML IM SOLN
25.0000 mg | Freq: Four times a day (QID) | INTRAMUSCULAR | Status: DC | PRN
  Filled 2016-02-04: qty 0.5

## 2016-02-04 MED ORDER — ROCURONIUM BROMIDE 100 MG/10ML IV SOLN
INTRAVENOUS | Status: DC | PRN
  Administered 2016-02-04 (×2): 50 mg via INTRAVENOUS

## 2016-02-04 MED ORDER — SODIUM CHLORIDE 0.9 % IV SOLN
INTRAVENOUS | Status: DC | PRN
  Administered 2016-02-04: 11:00:00 via INTRAVENOUS

## 2016-02-04 MED ORDER — FENTANYL BOLUS VIA INFUSION
25.0000 ug | INTRAVENOUS | Status: DC | PRN
  Administered 2016-02-04 – 2016-02-09 (×9): 25 ug via INTRAVENOUS
  Filled 2016-02-04: qty 25

## 2016-02-04 MED ORDER — SODIUM CHLORIDE 0.9 % IV BOLUS (SEPSIS)
500.0000 mL | Freq: Once | INTRAVENOUS | Status: AC
  Administered 2016-02-04: 500 mL via INTRAVENOUS

## 2016-02-04 MED ORDER — SODIUM CHLORIDE 0.9% FLUSH
3.0000 mL | Freq: Two times a day (BID) | INTRAVENOUS | Status: DC
  Administered 2016-02-04 – 2016-02-09 (×8): 3 mL via INTRAVENOUS

## 2016-02-04 MED ORDER — ONDANSETRON HCL 4 MG/2ML IJ SOLN: 4.0000 mg | Freq: Once | INTRAMUSCULAR | Status: DC | PRN

## 2016-02-04 MED ORDER — MIDAZOLAM HCL 2 MG/2ML IJ SOLN
2.0000 mg | INTRAMUSCULAR | Status: DC | PRN
  Administered 2016-02-04 – 2016-02-07 (×2): 2 mg via INTRAVENOUS
  Filled 2016-02-04: qty 2

## 2016-02-04 MED ORDER — OCTREOTIDE LOAD VIA INFUSION
50.0000 ug | Freq: Once | INTRAVENOUS | Status: AC
  Administered 2016-02-04: 50 ug via INTRAVENOUS
  Filled 2016-02-04: qty 25

## 2016-02-04 MED ORDER — ONDANSETRON HCL 4 MG/2ML IJ SOLN: 4.0000 mg | Freq: Four times a day (QID) | INTRAMUSCULAR | Status: DC | PRN

## 2016-02-04 MED ORDER — ROCURONIUM BROMIDE 100 MG/10ML IV SOLN
INTRAVENOUS | Status: AC
  Filled 2016-02-04: qty 1

## 2016-02-04 MED ORDER — SODIUM CHLORIDE 0.9 % IV SOLN
8.0000 mg/h | INTRAVENOUS | Status: AC
  Administered 2016-02-04 – 2016-02-06 (×5): 8 mg/h via INTRAVENOUS
  Filled 2016-02-04 (×15): qty 80

## 2016-02-04 MED ORDER — ETOMIDATE 2 MG/ML IV SOLN
INTRAVENOUS | Status: DC | PRN
  Administered 2016-02-04: 6 mg via INTRAVENOUS

## 2016-02-04 MED ORDER — DIPHENHYDRAMINE HCL 50 MG/ML IJ SOLN
INTRAMUSCULAR | Status: AC
  Filled 2016-02-04: qty 1

## 2016-02-04 NOTE — ED Notes (Signed)
Pt cannot use restroom at this time, aware urine specimen is needed.  

## 2016-02-04 NOTE — Progress Notes (Signed)
Second attempt to visit; Family was not present. Please page if support is needed. [place], M.Div. [phone]   01/17/2016 2000  Clinical Encounter Type  Visited With Other (Comment)

## 2016-02-04 NOTE — Consult Note (Signed)
PULMONARY / CRITICAL CARE MEDICINE   Name: Tiffany [name] MRN: [id number] DOB: 07-Apr-1929    ADMISSION DATE:  01/11/2016 CONSULTATION DATE:  01/27/16  REFERRING MD:  Dr. Amedeo Plenty  CHIEF COMPLAINT:  GIB, Intubated for Airway Protection  HISTORY OF PRESENT ILLNESS:  82 year old female with a past medical history of hypertension, chronic systolic heart failure ef 45%  In march 2016, atrial fibrillation, arthritis, dementia (on Namenda), seasonal allergies, Raynaud's disease, GERD, prior GIB in 02/2015 thought to be related to a Dieulafoy lesion and persistent anemia s/p colonoscopy / capsule endoscopy that were unrevealing for anemia source. She presented to [facility] on 2/17 after a near syncopal episode and vomiting up blood.  Subsequent course as follows     SIGNIFICANT EVENTS and studies 2/17  Admit with hematemesis  2/17 EGD >> ? esophageal varices, multiple bands -> intubated, needed 2U PRBC 2/21 - CT abd w/ large right effusion and Cirrhosis without mass.   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 Rx inderal    SUBJECTIVE/OVERNIGHT/INTERVAL HX  01/11/2016 - Readmitted - OR for endoscopy with Dr Amedeo Plenty and then to ICU and PCCM consulted. PEr report from various sources - failed banding and [place] tube placed and with reported success at 2nd attempt. Never dropped bp. Currently s/p rocuronium last 60 min agao    has a past medical history of Hypertension; Arthritis; Dementia; Allergic rhinitis; GI bleed; Raynaud's disease; GERD (gastroesophageal reflux disease); Acute respiratory failure ([place]); Pleural effusion; ILD (interstitial lung disease) ([place]); Pulmonary edema; Chronic diastolic (congestive) heart failure ([place]); Chronic systolic heart failure ([place]); A-fib ([place]); and  Cirrhosis of liver not due to alcohol ([place]).   reports that she has never smoked. She has never used smokeless tobacco.  Past Surgical History  Procedure Laterality Date  . Back surgery  09/25/2011  . Abdominal hysterectomy    . Esophagogastroduodenoscopy N/A 03/01/2015    Procedure: ESOPHAGOGASTRODUODENOSCOPY (EGD);  Surgeon: Arta Silence, MD;  Location: [facility] END[facility]COPY;  Service: Endoscopy;  Laterality: N/A;  . Esophagogastroduodenoscopy N/A 03/02/2015    Procedure: ESOPHAGOGASTRODUODENOSCOPY (EGD);  Surgeon: Arta Silence, MD;  Location: [facility] ENDOSCOPY;  Service: Endoscopy;  Laterality: N/A;  bedside  . Esophagogastroduodenoscopy (egd) with propofol N/A 06/25/2015    Procedure: ESOPHAGOGASTRODUODENOSCOPY (EGD) WITH PROPOFOL;  Surgeon: Clarene Essex, MD;  Location: [facility] ENDOSCOPY;  Service: Endoscopy;  Laterality: N/A;  . Colonoscopy with propofol N/A 06/27/2015    Procedure: COLONOSCOPY WITH PROPOFOL;  Surgeon: Arta Silence, MD;  Location: [facility] ENDOSCOPY;  Service: Endoscopy;  Laterality: N/A;  . Givens capsule study N/A 06/27/2015    Procedure: GIVENS CAPSULE STUDY;  Surgeon: Arta Silence, MD;  Location: [facility] ENDOSCOPY;  Service: Endoscopy;  Laterality: N/A;  . Esophagogastroduodenoscopy N/A 01/27/2016    Procedure: ESOPHAGOGASTRODUODENOSCOPY (EGD);  Surgeon: Teena Irani, MD;  Location: [facility] ENDOSCOPY;  Service: Endoscopy;  Laterality: N/A;    No Known Allergies  Immunization History  Administered Date(s) Administered  . Influenza Split 08/20/2012  . Influenza,inj,Quad PF,36+ Mos 08/25/2013  . Influenza-Unspecified 08/23/2014, 10/05/2015  . Pneumococcal Polysaccharide-23 04/15/2014    Family History  Problem Relation Age of Onset  . Heart attack Brother   . Ovarian cancer Mother   . Osteoarthritis Mother   . Colon cancer Sister      Current facility-administered medications:  .  cefTRIAXone (  ROCEPHIN) 1 g in dextrose 5 % 50 mL IVPB, 1 g, Intravenous, Q24H, Ivor Costa, MD, 1 g at 02/03/2016  0449 .  fentaNYL (SUBLIMAZE) injection 25-50 mcg, 25-50 mcg, Intravenous, Q5 min PRN, Jillyn Hidden, MD .  hydrOXYzine (VISTARIL) injection 25 mg, 25 mg, Intramuscular, Q6H PRN, Ivor Costa, MD .  morphine 2 MG/ML injection 2 mg, 2 mg, Intravenous, Q4H PRN, Ivor Costa, MD .  [COMPLETED] octreotide (SANDOSTATIN) 2 mcg/mL load via infusion 50 mcg, 50 mcg, Intravenous, Once, 50 mcg at 01/24/2016 0226 **AND** octreotide (SANDOSTATIN) 500 mcg in sodium chloride 0.9 % 250 mL (2 mcg/mL) infusion, 50 mcg/hr, Intravenous, Continuous, Orpah Greek, MD, Last Rate: 25 mL/hr at 01/21/2016 1020 .  ondansetron (ZOFRAN) injection 4 mg, 4 mg, Intravenous, Once PRN, Jillyn Hidden, MD .  pantoprazole (PROTONIX) 80 mg in sodium chloride 0.9 % 250 mL (0.32 mg/mL) infusion, 8 mg/hr, Intravenous, Continuous, Orpah Greek, MD, Last Rate: 25 mL/hr at 01/17/2016 1020, 8 mg/hr at 02/03/2016 1020 .  [START ON 02/07/2016] pantoprazole (PROTONIX) injection 40 mg, 40 mg, Intravenous, Q12H, Orpah Greek, MD .  sodium chloride flush (NS) 0.9 % injection 3 mL, 3 mL, Intravenous, Q12H, Ivor Costa, MD, 3 mL at 01/26/2016 0946  Current outpatient prescriptions:  .  ferrous sulfate 325 (65 FE) MG tablet, Take 325 mg by mouth daily with breakfast., Disp: , Rfl:  .  furosemide (LASIX) 40 MG tablet, Take 0.5 tablets (20 mg total) by mouth daily., Disp: 30 tablet, Rfl: 5 .  NAMENDA XR 28 MG CP24 24 hr capsule, TAKE 1 CAPSULE (28 MG TOTAL) BY MOUTH DAILY., Disp: 30 capsule, Rfl: 2 .  pantoprazole (PROTONIX) 40 MG tablet, Take 1 tablet (40 mg total) by mouth 2 (two) times daily., Disp: 180 tablet, Rfl: 1 .  Potassium Chloride ER 20 MEQ TBCR, TAKE 1 TABLET (20 MEQ TOTAL) BY MOUTH DAILY., Disp: 30 tablet, Rfl: 5 .  propranolol (INDERAL) 20 MG tablet, Take 1 tablet (20 mg total) by mouth every 12 (twelve) hours., Disp: 60 tablet, Rfl: 0 .  mirtazapine (REMERON) 15 MG tablet, Take 1 tablet (15 mg total) by mouth at bedtime. (Patient  not taking: Reported on 02/02/2016), Disp: 90 tablet, Rfl: 1  Facility-Administered Medications Ordered in Other Encounters:  .  0.9 %  sodium chloride infusion, , , Continuous PRN, Lind Covert, CRNA, Stopped at 01/16/2016 1230 .  etomidate (AMIDATE) injection, , , Anesthesia Intra-op, Lind Covert, CRNA, 6 mg at 01/27/2016 1025 .  lactated ringers infusion, , , Continuous PRN, Lind Covert, CRNA .  lidocaine (cardiac) 100 mg/47ml (XYLOCAINE) 20 MG/ML injection 2%, , , Anesthesia Intra-op, Lind Covert, CRNA, 100 mg at 01/23/2016 1025 .  propofol (DIPRIVAN) 10 mg/mL bolus/IV push, , , Anesthesia Intra-op, Lind Covert, CRNA, 20 mg at 01/18/2016 1204 .  rocuronium (ZEMURON) injection, , , Anesthesia Intra-op, Lind Covert, CRNA, 50 mg at 01/18/2016 1200 .  succinylcholine (ANECTINE) injection, , , Anesthesia Intra-op, Lind Covert, CRNA, 100 mg at 01/17/2016 1025    CULTURES: Results for orders placed or performed during the hospital encounter of 01/27/16  MRSA PCR Screening     Status: Abnormal   Collection Time: 01/27/16  3:13 PM  Result Value Ref Range Status   MRSA by PCR POSITIVE (A) NEGATIVE Final    Comment:        The GeneXpert MRSA Assay (FDA approved for NASAL specimens only), is one component of a comprehensive MRSA  colonization surveillance program. It is not intended to diagnose MRSA infection nor to guide or monitor treatment for MRSA infections. RESULT CALLED TO, READ BACK BY AND VERIFIED WITH: L.FREI AT [id number] ON 01/27/16 BY S.VANHOORNE   Body fluid culture     Status: None   Collection Time: 02/01/16 11:18 AM  Result Value Ref Range Status   Specimen Description PLEURAL  Final   Special Requests NONE  Final   Gram Stain   Final    ABUNDANT WBC PRESENT,BOTH PMN AND MONONUCLEAR NO ORGANISMS SEEN    Culture   Final    NO GROWTH 3 DAYS Performed at [facility]    Report Status 01/28/2016 FINAL  Final      ANTIBIOTICS: Vanco 2/17 >>  off Zosyn 2/17 >> off   LINES/TUBES: ETT 2/17 >> 2/19,  R IJ TLC 2/17 >> out ............. ETT 2/25 (rpeat admit ) >>  CVL by OR 2/25 >> Aline byu OR 2/25 >      VITAL SIGNS: BP 110/65 mmHg  Pulse 73  Temp(Src) 98.2 F (36.8 C) (Oral)  Resp 17  SpO2 98% 2 liters   INTAKE / OUTPUT: I/O last 3 completed shifts: In: 500 [I.V.:500] Out: -   PHYSICAL EXAMINATION: General:  Elderly female in NAD . Intubated. [place] critically ill Neuro:  RASS -4 immediate post anesthesia - examined in ICU bed HEENT: Has [place] tube to traction . Intubated Cardiovascular:  s1s2 rrr, no m/r/g Lungs:  Synch with vent. CTA biltarellay Abdomen:  Soft, non-distented Musculoskeletal:  No acute deformities  Skin:  Warm/dry, no edema   LABS:  PULMONARY No results for input(s): PHART, PCO2ART, PO2ART, HCO3, TCO2, O2SAT in the last 168 hours.  Invalid input(s): PCO2, PO2  CBC  Recent Labs Lab 02/02/16 0405 01/20/2016 0157 02/05/2016 0458  HGB 10.4* 10.9* 9.0*  HCT 32.6* 34.1* 28.0*  WBC 6.0 13.3* 6.9  PLT 104* 235 116*    COAGULATION  Recent Labs Lab 02/03/2016 0458  INR 1.37    CARDIAC  No results for input(s): TROPONINI in the last 168 hours. No results for input(s): PROBNP in the last 168 hours.   CHEMISTRY  Recent Labs Lab 01/31/16 0500 02/01/16 0419 02/02/16 0405 01/15/2016 0157 01/29/2016 0458  NA 146* 145 147* 149* 145  K 3.4* 3.3* 3.9 4.0 4.3  CL 116* 112* 115* 117* 115*  CO2 24 24 24  21* 21*  GLUCOSE 120* 122* 101* 159* 142*  BUN 15 16 18  23* 25*  CREATININE 0.75 0.92 0.97 1.13* 0.97  CALCIUM 8.4* 8.4* 8.4* 8.9 8.0*   Estimated Creatinine Clearance: 37.3 mL/min (by C-G formula based on Cr of 0.97).   LIVER  Recent Labs Lab 01/31/16 0500 02/02/16 0405 01/23/2016 0157 01/13/2016 0458  AST 34  --  22 18  ALT 23  --  16 14  ALKPHOS 83  --  115 101  BILITOT 0.7  --  0.6 0.6  PROT 5.5* 5.4* 5.9* 5.1*  ALBUMIN 3.1*  --  3.3* 2.9*  INR  --   --   --   1.37     INFECTIOUS  Recent Labs Lab 01/29/16 0453  PROCALCITON <0.10     ENDOCRINE CBG (last 3)   Recent Labs  02/02/2016 0849  GLUCAP 130*         IMAGING x48h  - image(s) personally visualized  -   highlighted in bold Dg Chest Port 1 View  01/28/2016  CLINICAL DATA:  82 year old female with shortness  of breath and acute respiratory failure. EXAM: PORTABLE CHEST 1 VIEW COMPARISON:  02/01/2016 and prior exams. FINDINGS: This is a mildly low volume film. Cardiomegaly is noted with new mild pulmonary vascular congestion. Bibasilar opacities and probable effusions present. There is no evidence pneumothorax. No other significant changes identified. IMPRESSION: Cardiomegaly with mild pulmonary vascular congestion. Continued bibasilar atelectasis/ opacities and probable effusions. Electronically Signed   By: Margarette Canada M.D.   On: 01/13/2016 08:19   Dg Abd Portable 1v  01/20/2016  CLINICAL DATA:  GI bleed. [place] tube placement for gastric bleeding EXAM: PORTABLE ABDOMEN - 1 VIEW COMPARISON:  None. FINDINGS: Enteric tube is in place, coiling in the proximal stomach. Nonobstructive bowel gas pattern. Bilateral lower lobe airspace opacities noted. IMPRESSION: Enteric tube coiling within the proximal stomach. Electronically Signed   By: Rolm Baptise M.D.   On: 01/29/2016 12:21    ASSESSMENT / PLAN:  PULMONARY A: #recurrent R effusion march 2016 and feb 2017 when resuscitated for Gi bleed  #current Acute resp failure - for GI Bleed, sp intubation in OR @ risk for recurrent gi bleed  P:   Full vent support Check cxr for pleural effusio - if persists consider hepatic hydrothorax  CARDIOVASCULAR A:  #baseline  - chronic systolic CHF  #Now  At risk for hemorrhagic shock - currently maintaining bP At risk for MI   P:  Check echo Cycle troponin MAP goal > 65  RENAL A:   At risk for AKI/ATN due to volume loss and Cirrhosis  P:   Monitor d5 half at  75cc/h   GASTROINTESTINAL A:   #Baseline  - child A cirrhosis - new dx feb 2017 Current  - Recurrent life threatening variceal bleed - 3rd episode since march 2043m, 2nd in feb 20178  P:   Might be a candiate for TIPS - have reached out to Dr Amedeo Plenty Octreotide gtt + Protoxnix Gtt GI following  HEMATOLOGIC A:   Acute blood loss anemia  P:  PRBC for hgb < 7gm% or active bleeding  INFECTIOUS A:   No evidence of infection  P:   SBP prophylaxis with ceftriaxone monitor  ENDOCRINE A:   No hx of DM   P:   ssi  NEUROLOGIC A:   #baseline Dementia - on home Namenda and Remeron  #curernt - at risk for encephalopathy due to GIB, cirrhosis and dementia P:   Lactulose Prn sedation RASS goal: o to -2    FAMILY  - Updates: no family at bedside 01/29/2016 in ICU but GI RN said Dr Amedeo Plenty of GI did update them  - Inter-disciplinary family meet or Palliative Care meeting due by:  02/11/16 which is day 7. Currently Chaplain consult called    The patient is critically ill with multiple organ systems failure and requires high complexity decision making for assessment and support, frequent evaluation and titration of therapies, application of advanced monitoring technologies and extensive interpretation of multiple databases.   Critical Care Time devoted to patient care services described in this note is  60  Minutes. This time reflects time of care of this signee Dr Brand Males. This critical care time does not reflect procedure time, or teaching time or supervisory time of PA/NP/Med student/Med Resident etc but could involve care discussion time    Dr. Brand Males, M.D., [facility].C.P Pulmonary and Critical Care Medicine Staff Physician [place] [facility] Pager: [phone], If no answer or between  15:00h - 7:00h:  call [phone]  01/13/2016 12:44 PM

## 2016-02-04 NOTE — Op Note (Signed)
[facility] [place], [address]   ENDOSCOPY PROCEDURE REPORT  PATIENT: Tiffany, [name]  MR#: [id number] BIRTHDATE: 12/06/1929 , 69  yrs. old GENDER: female ENDOSCOPIST: Teena Irani, MD REFERRED BY: PROCEDURE DATE:  02-20-2016 PROCEDURE: ASA CLASS: INDICATIONS:     hematemesis MEDICATIONS: general anesthesia TOPICAL ANESTHETIC:  DESCRIPTION OF PROCEDURE: After the risks benefits and alternatives of the procedure were thoroughly explained, informed consent was obtained.  The Pentax Gastroscope [id number] endoscope was introduced through the mouth and advanced to the second portion of the duodenum , Without limitations.  The instrument was slowly withdrawn as the mucosa was fully examined. Estimated blood loss is zero unless otherwise noted in this procedure report.    copious blood in the esophagus withprominent esophageal varices, some with bands others with scarred areas from previous banding stomach moderate amount of blood not examined in great detail. Duodenum normal. Scope was withdrawn and the banding apparatus reintroduced. A band was placed over a distal eschar over a varix but it began bleeding copiously and did not retain that a varix in the banding device. From that point for visualization was very few poor due toincreasing active bleeding. Several bands were attempted to be placed but a few these miss fired. It was felt that endoscopic therapy was not going to be successful due to the copious bleeding and poor visualization. The scope was withdrawn and a [place] tube was placed with the gastric balloon inflated to500 mL after confirmation below the diaphragm inflation of 100 mL of air. 8 traction device was fashioned using an IV bag over an IV pole with gauze rope            The scope was then withdrawn from the patient and the procedure completed.  COMPLICATIONS: There were no immediate complications.  ENDOSCOPIC  IMPRESSION: massive upper GI bleeding from esophageal varices  RECOMMENDATIONS: continue [place] tube transfuse pooled RBCs as needed. IV octreotide, IV Protonix and antibiotics.  REPEAT EXAM:  eSigned:  Teena Irani, MD 2016/02/20 12:32 PM    CC:  CPT CODES: ICD CODES:  The ICD and CPT codes recommended by this software are interpretations from the data that the clinical staff has captured with the software.  The verification of the translation of this report to the ICD and CPT codes and modifiers is the sole responsibility of the health care institution and practicing physician where this report was generated.  [place]. will not be held responsible for the validity of the ICD and CPT codes included on this report.  AMA assumes no liability for data contained or not contained herein. CPT is a Designer, television/film set of the [employer].  PATIENT NAME:  Tiffany, [name] MR#: [id number]

## 2016-02-04 NOTE — Consult Note (Signed)
[facility] Consult Note  Referring Provider: No ref. provider found Primary Care Physician:  Kennyth Arnold, FNP Primary Gastroenterologist:  Dr.  Laurel Dimmer Complaint: Vomiting blood HPI: Tiffany [name] is an 82 y.o. white female  recently discharged from the hospital after presenting with acute upper GI bleed determined to be from esophageal varices which were not present on EGD 2 years prior when she bled from an [place] for a lesion. On the most recent admission multiple bands were placed with cessation of bleeding. She underwent thoracentesis for pleural effusion which was transudate of and was discharged on Inderal. She did well until yesterday when she vomited up bright red blood very similar to her previous presentation. She was slightly hypotensive in the emergency room with a stable hemoglobin 10.9 compared to 10.4 on February 23. The cause of her portal hypertension has not yet been worked up.  Past Medical History  Diagnosis Date  . Hypertension   . Arthritis   . Dementia   . Allergic rhinitis   . GI bleed   . Raynaud's disease   . GERD (gastroesophageal reflux disease)   . Acute respiratory failure ([place])   . Pleural effusion   . ILD (interstitial lung disease) ([place])   . Pulmonary edema   . Chronic diastolic (congestive) heart failure ([place])   . Chronic systolic heart failure ([place])   . A-fib ([place])   . Cirrhosis of liver not due to alcohol [facility])     Past Surgical History  Procedure Laterality Date  . Back surgery  09/25/2011  . Abdominal hysterectomy    . Esophagogastroduodenoscopy N/A 03/01/2015    Procedure: ESOPHAGOGASTRODUODENOSCOPY (EGD);  Surgeon: Arta Silence, MD;  Location: [facility];  Service: Endoscopy;  Laterality: N/A;  . Esophagogastroduodenoscopy N/A 03/02/2015    Procedure: ESOPHAGOGASTRODUODENOSCOPY (EGD);  Surgeon: Arta Silence, MD;  Location: [facility];  Service: Endoscopy;  Laterality: N/A;  bedside  . Esophagogastroduodenoscopy (egd)  with propofol N/A 06/25/2015    Procedure: ESOPHAGOGASTRODUODENOSCOPY (EGD) WITH PROPOFOL;  Surgeon: Clarene Essex, MD;  Location: [facility] ENDOSCOPY;  Service: Endoscopy;  Laterality: N/A;  . Colonoscopy with propofol N/A 06/27/2015    Procedure: COLONOSCOPY WITH PROPOFOL;  Surgeon: Arta Silence, MD;  Location: [facility] ENDOSCOPY;  Service: Endoscopy;  Laterality: N/A;  . Givens capsule study N/A 06/27/2015    Procedure: GIVENS CAPSULE STUDY;  Surgeon: Arta Silence, MD;  Location: [facility] ENDOSCOPY;  Service: Endoscopy;  Laterality: N/A;  . Esophagogastroduodenoscopy N/A 01/27/2016    Procedure: ESOPHAGOGASTRODUODENOSCOPY (EGD);  Surgeon: Teena Irani, MD;  Location: [facility];  Service: Endoscopy;  Laterality: N/A;     (Not in a hospital admission)  Allergies: No Known Allergies  Family History  Problem Relation Age of Onset  . Heart attack Brother   . Ovarian cancer Mother   . Osteoarthritis Mother   . Colon cancer Sister     Social History:  reports that she has never smoked. She has never used smokeless tobacco. She reports that she does not drink alcohol or use illicit drugs.  Review of Systems: negative except as above   Blood pressure 112/47, pulse 72, temperature 98.2 F (36.8 C), temperature source Oral, resp. rate 20, SpO2 100 %. Head: Normocephalic, without obvious abnormality, atraumatic Neck: no adenopathy, no carotid bruit, no JVD, supple, symmetrical, trachea midline and thyroid not enlarged, symmetric, no tenderness/mass/nodules Resp: clear to auscultation bilaterally Cardio: regular rate and rhythm, S1, S2 normal, no murmur, click, rub or gallop GI: Abdomen soft nondistended Extremities: extremities normal, atraumatic,  no cyanosis or edema  Results for orders placed or performed during the hospital encounter of 01/23/2016 (from the past 48 hour(s))  Type and screen [place]     Status: None   Collection Time: 01/27/2016  1:27 AM  Result Value Ref Range    ABO/RH(D) B NEG    Antibody Screen NEG    Sample Expiration 02/07/2016   Comprehensive metabolic panel     Status: Abnormal   Collection Time: 01/20/2016  1:57 AM  Result Value Ref Range   Sodium 149 (H) 135 - 145 mmol/L   Potassium 4.0 3.5 - 5.1 mmol/L   Chloride 117 (H) 101 - 111 mmol/L   CO2 21 (L) 22 - 32 mmol/L   Glucose, Bld 159 (H) 65 - 99 mg/dL   BUN 23 (H) 6 - 20 mg/dL   Creatinine, Ser 1.13 (H) 0.44 - 1.00 mg/dL   Calcium 8.9 8.9 - 10.3 mg/dL   Total Protein 5.9 (L) 6.5 - 8.1 g/dL   Albumin 3.3 (L) 3.5 - 5.0 g/dL   AST 22 15 - 41 U/L   ALT 16 14 - 54 U/L   Alkaline Phosphatase 115 38 - 126 U/L   Total Bilirubin 0.6 0.3 - 1.2 mg/dL   GFR calc non Af Amer 43 (L) >60 mL/min   GFR calc Af Amer 50 (L) >60 mL/min    Comment: (NOTE) The eGFR has been calculated using the CKD EPI equation. This calculation has not been validated in all clinical situations. eGFR's persistently <60 mL/min signify possible Chronic Kidney Disease.    Anion gap 11 5 - 15  CBC     Status: Abnormal   Collection Time: 01/13/2016  1:57 AM  Result Value Ref Range   WBC 13.3 (H) 4.0 - 10.5 K/uL   RBC 3.61 (L) 3.87 - 5.11 MIL/uL   Hemoglobin 10.9 (L) 12.0 - 15.0 g/dL   HCT 34.1 (L) 36.0 - 46.0 %   MCV 94.5 78.0 - 100.0 fL   MCH 30.2 26.0 - 34.0 pg   MCHC 32.0 30.0 - 36.0 g/dL   RDW 16.3 (H) 11.5 - 15.5 %   Platelets 235 150 - 400 K/uL    Comment: DELTA CHECK NOTED REPEATED TO VERIFY   CBC     Status: Abnormal   Collection Time: 02/07/2016  4:58 AM  Result Value Ref Range   WBC 6.9 4.0 - 10.5 K/uL   RBC 2.94 (L) 3.87 - 5.11 MIL/uL   Hemoglobin 9.0 (L) 12.0 - 15.0 g/dL   HCT 28.0 (L) 36.0 - 46.0 %   MCV 95.2 78.0 - 100.0 fL   MCH 30.6 26.0 - 34.0 pg   MCHC 32.1 30.0 - 36.0 g/dL   RDW 16.3 (H) 11.5 - 15.5 %   Platelets 116 (L) 150 - 400 K/uL    Comment: DELTA CHECK NOTED SPECIMEN CHECKED FOR CLOTS REPEATED TO VERIFY PLATELET COUNT CONFIRMED BY SMEAR   Brain natriuretic peptide     Status:  None   Collection Time: 02/03/2016  4:58 AM  Result Value Ref Range   B Natriuretic Peptide 80.8 0.0 - 100.0 pg/mL  Protime-INR     Status: Abnormal   Collection Time: 01/26/2016  4:58 AM  Result Value Ref Range   Prothrombin Time 16.5 (H) 11.6 - 15.2 seconds   INR 1.37 0.00 - 1.49  APTT     Status: None   Collection Time: 01/14/2016  4:58 AM  Result Value Ref Range  aPTT 27 24 - 37 seconds  Comprehensive metabolic panel     Status: Abnormal   Collection Time: 01/22/2016  4:58 AM  Result Value Ref Range   Sodium 145 135 - 145 mmol/L   Potassium 4.3 3.5 - 5.1 mmol/L   Chloride 115 (H) 101 - 111 mmol/L   CO2 21 (L) 22 - 32 mmol/L   Glucose, Bld 142 (H) 65 - 99 mg/dL   BUN 25 (H) 6 - 20 mg/dL   Creatinine, Ser 0.97 0.44 - 1.00 mg/dL   Calcium 8.0 (L) 8.9 - 10.3 mg/dL   Total Protein 5.1 (L) 6.5 - 8.1 g/dL   Albumin 2.9 (L) 3.5 - 5.0 g/dL   AST 18 15 - 41 U/L   ALT 14 14 - 54 U/L   Alkaline Phosphatase 101 38 - 126 U/L   Total Bilirubin 0.6 0.3 - 1.2 mg/dL   GFR calc non Af Amer 52 (L) >60 mL/min   GFR calc Af Amer >60 >60 mL/min    Comment: (NOTE) The eGFR has been calculated using the CKD EPI equation. This calculation has not been validated in all clinical situations. eGFR's persistently <60 mL/min signify possible Chronic Kidney Disease.    Anion gap 9 5 - 15   No results found.  Assessment: Recurrent upper GI bleed, very likely esophageal varices. Plan:  Octreotide and Protonix Urgent endoscopy under general anesthesia. Eventual workup of portal hypertension/cirrhosis. Karynn Deblasi C 02/07/2016, 6:19 AM  Pager [phone] If no answer or after 5 PM call [phone]

## 2016-02-04 NOTE — ED Notes (Signed)
Pt w/ hx of esophageal varices.  Recently admitted for the same where she was dx w/ non alcoholic liver cirrhosis.  Approximately 1 hour ago pt began to vomit bright red blood.

## 2016-02-04 NOTE — ED Provider Notes (Signed)
CSN: [id number]     Arrival date & time 02/02/2016  0135 History  By signing my name below, I, Terrance Branch, attest that this documentation has been prepared under the direction and in the presence of Orpah Greek, MD. Electronically Signed: Randa Evens, ED Scribe. 01/27/2016. 2:07 AM.    Chief Complaint  Patient presents with  . Hematemesis    The history is provided by the patient and a relative. No language interpreter was used.   HPI Comments: Tiffany [name] is a 82 y.o. female with PMHx of esophageal varices who presents to the Emergency Department complaining of hematemesis onset 1 hour PTA. Family states that she was recently discharged from the hospital for the same symptoms. Fa,ily states that she was diagnosed with non acholic liver cirrohisis. Family states that esophageal varices was banded. Pt denies pain at this time. No treatments tried PTA.   Past Medical History  Diagnosis Date  . Hypertension   . Arthritis   . Dementia   . Allergic rhinitis   . GI bleed   . Raynaud's disease   . GERD (gastroesophageal reflux disease)   . Acute respiratory failure ([place])   . Pleural effusion   . ILD (interstitial lung disease) ([place])   . Pulmonary edema   . Chronic diastolic (congestive) heart failure ([place])   . Chronic systolic heart failure ([place])   . A-fib ([place])   . Cirrhosis of liver not due to alcohol [facility])    Past Surgical History  Procedure Laterality Date  . Back surgery  09/25/2011  . Abdominal hysterectomy    . Esophagogastroduodenoscopy N/A 03/01/2015    Procedure: ESOPHAGOGASTRODUODENOSCOPY (EGD);  Surgeon: Arta Silence, MD;  Location: [facility];  Service: Endoscopy;  Laterality: N/A;  . Esophagogastroduodenoscopy N/A 03/02/2015    Procedure: ESOPHAGOGASTRODUODENOSCOPY (EGD);  Surgeon: Arta Silence, MD;  Location: [facility];  Service: Endoscopy;  Laterality: N/A;  bedside  . Esophagogastroduodenoscopy (egd) with propofol N/A 06/25/2015     Procedure: ESOPHAGOGASTRODUODENOSCOPY (EGD) WITH PROPOFOL;  Surgeon: Clarene Essex, MD;  Location: [facility] ENDOSCOPY;  Service: Endoscopy;  Laterality: N/A;  . Colonoscopy with propofol N/A 06/27/2015    Procedure: COLONOSCOPY WITH PROPOFOL;  Surgeon: Arta Silence, MD;  Location: [facility] ENDOSCOPY;  Service: Endoscopy;  Laterality: N/A;  . Givens capsule study N/A 06/27/2015    Procedure: GIVENS CAPSULE STUDY;  Surgeon: Arta Silence, MD;  Location: [facility] ENDOSCOPY;  Service: Endoscopy;  Laterality: N/A;  . Esophagogastroduodenoscopy N/A 01/27/2016    Procedure: ESOPHAGOGASTRODUODENOSCOPY (EGD);  Surgeon: Teena Irani, MD;  Location: [facility];  Service: Endoscopy;  Laterality: N/A;   Family History  Problem Relation Age of Onset  . Heart attack Brother   . Ovarian cancer Mother   . Osteoarthritis Mother   . Colon cancer Sister    Social History  Substance Use Topics  . Smoking status: Never Smoker   . Smokeless tobacco: Never Used  . Alcohol Use: No   OB History    No data available      Review of Systems  Gastrointestinal: Positive for vomiting. Negative for abdominal pain.  All other systems reviewed and are negative.    Allergies  Review of patient's allergies indicates no known allergies.  Home Medications   Prior to Admission medications   Medication Sig Start Date End Date Taking? Authorizing Provider  Famotidine (PEPCID PO) Take 1 tablet by mouth daily as needed (heartburn). Reported on 01/02/2016    Historical Provider, MD  ferrous sulfate 325 (65 FE) MG  tablet Take 325 mg by mouth daily with breakfast.    Historical Provider, MD  fluticasone (FLONASE) 50 MCG/ACT nasal spray Place 2 sprays into both nostrils daily. 10/05/15   Kennyth Arnold, FNP  furosemide (LASIX) 40 MG tablet Take 0.5 tablets (20 mg total) by mouth daily. 06/28/15   Barton Dubois, MD  mirtazapine (REMERON) 15 MG tablet Take 1 tablet (15 mg total) by mouth at bedtime. 11/01/15   Kennyth Arnold, FNP  NAMENDA XR 28  MG CP24 24 hr capsule TAKE 1 CAPSULE (28 MG TOTAL) BY MOUTH DAILY. 10/10/15   Pieter Partridge, DO  pantoprazole (PROTONIX) 40 MG tablet Take 1 tablet (40 mg total) by mouth 2 (two) times daily. 07/04/15   Kennyth Arnold, FNP  Potassium Chloride ER 20 MEQ TBCR TAKE 1 TABLET (20 MEQ TOTAL) BY MOUTH DAILY. 11/10/15   Noralee Space, MD  propranolol (INDERAL) 20 MG tablet Take 1 tablet (20 mg total) by mouth every 12 (twelve) hours. 02/02/16   Debbe Odea, MD   BP 107/56 mmHg  Pulse 80  Temp(Src) 98 F (36.7 C) (Oral)  Resp 23  SpO2 93%   Physical Exam  Constitutional: She is oriented to person, place, and time. She appears well-developed and well-nourished. No distress.  HENT:  Head: Normocephalic and atraumatic.  Right Ear: Hearing normal.  Left Ear: Hearing normal.  Nose: Nose normal.  Mouth/Throat: Oropharynx is clear and moist and mucous membranes are normal.  Eyes: Conjunctivae and EOM are normal. Pupils are equal, round, and reactive to light.  Neck: Normal range of motion. Neck supple.  Cardiovascular: Regular rhythm, S1 normal and S2 normal.  Exam reveals no gallop and no friction rub.   No murmur heard. Pulmonary/Chest: Effort normal and breath sounds normal. No respiratory distress. She exhibits no tenderness.  Abdominal: Soft. Normal appearance and bowel sounds are normal. There is no hepatosplenomegaly. There is no tenderness. There is no rebound, no guarding, no tenderness at McBurney's point and negative Murphy's sign. No hernia.  Musculoskeletal: Normal range of motion.  Neurological: She is alert and oriented to person, place, and time. She has normal strength. No cranial nerve deficit or sensory deficit. Coordination normal. GCS eye subscore is 4. GCS verbal subscore is 5. GCS motor subscore is 6.  Skin: Skin is warm, dry and intact. No rash noted. No cyanosis.  Psychiatric: She has a normal mood and affect. Her speech is normal and behavior is normal. Thought content normal.   Nursing note and vitals reviewed.   ED Course  Procedures (including critical care time) DIAGNOSTIC STUDIES: Oxygen Saturation is 98% on RA, normal by my interpretation.    COORDINATION OF CARE: 2:04 AM-Discussed treatment plan with pt at bedside and pt agreed to plan.     Labs Review Labs Reviewed  COMPREHENSIVE METABOLIC PANEL - Abnormal; Notable for the following:    Sodium 149 (*)    Chloride 117 (*)    CO2 21 (*)    Glucose, Bld 159 (*)    BUN 23 (*)    Creatinine, Ser 1.13 (*)    Total Protein 5.9 (*)    Albumin 3.3 (*)    GFR calc non Af Amer 43 (*)    GFR calc Af Amer 50 (*)    All other components within normal limits  CBC - Abnormal; Notable for the following:    WBC 13.3 (*)    RBC 3.61 (*)    Hemoglobin 10.9 (*)  HCT 34.1 (*)    RDW 16.3 (*)    All other components within normal limits  TYPE AND SCREEN    Imaging Review No results found.    EKG Interpretation None      MDM   Final diagnoses:  Hematemesis without nausea    Presents to the ER for evaluation of hematemesis. Patient was recently hospitalized for similar. During that hospitalization she had upper endoscopy which showed bleeding varices secondary to nonalcoholic cirrhosis. She had variceal banding during EGD.  Patient had episode of vomiting up one large amount of blood earlier to arrival in the ER. She has not had any further hematemesis here in the ER thus far. She was initiated on Protonix protocol drip and octreotide protocol drip. She was empirically given Rocephin.  Patient ishemodynamically stable. Discussed with Dr. Amedeo Plenty, on call for [facility] GI. He agrees with current treatment plan, will see her in the morning.  I personally performed the services described in this documentation, which was scribed in my presence. The recorded information has been reviewed and is accurate.      Orpah Greek, MD 01/16/2016 [phone]

## 2016-02-04 NOTE — Progress Notes (Signed)
Oxygen saturation was not picking up well.  No appropriate wave form was present.  Patient's temperature was checked, and a heating blanket was ordered and placed on patient as a result.  Once patient began warming up appropriately, the oxygen saturation began reading with an appropriate waveform, and consistently with the EKG HR.  The monitor showed that the patient's oxygen level was in the 80's. Patient is on 100% oxygen. Respiratory and MD were notified.  Orders were placed to increase the PEEP and to get a CXR.  Patient's O2 levels are beginning to rise.  MD was also notified about the patient's decreased urine output.  CVP was ordered.   Will continue to monitor.

## 2016-02-04 NOTE — ED Notes (Signed)
OR called and is ready for pt, reports that they will not have to bring patient back to the ER and can admit her directly from endo.

## 2016-02-04 NOTE — ED Notes (Signed)
EKG given to EDP,Pollina,MD., for review. 

## 2016-02-04 NOTE — Anesthesia Procedure Notes (Addendum)
Procedure Name: Intubation Date/Time: 01/27/2016 10:25 AM Performed by: Lind Covert Pre-anesthesia Checklist: Patient identified, Timeout performed, Emergency Drugs available, Suction available and Patient being monitored Patient Re-evaluated:Patient Re-evaluated prior to inductionOxygen Delivery Method: Circle system utilized Preoxygenation: Pre-oxygenation with 100% oxygen Intubation Type: IV induction, Cricoid Pressure applied and Rapid sequence Laryngoscope Size: Mac and 3 Grade View: Grade I Tube type: Parker flex tip Tube size: 7.0 mm Number of attempts: 1 Airway Equipment and Method: Stylet Placement Confirmation: ETT inserted through vocal cords under direct vision,  positive ETCO2 and breath sounds checked- equal and bilateral Secured at: 20 cm Tube secured with: Tape Dental Injury: Teeth and Oropharynx as per pre-operative assessment    Central Venous Catheter Insertion Performed by: anesthesiologist Patient location: Pre-op. Preanesthetic checklist: patient identified, IV checked, site marked, risks and benefits discussed, surgical consent, monitors and equipment checked, pre-op evaluation, timeout performed and anesthesia consent Landmarks identified Catheter size: 8 Fr Central line was placed.Double lumen Procedure performed using ultrasound guided technique. Attempts: 1 Following insertion, dressing applied and line sutured. Post procedure assessment: blood return through all ports. Patient tolerated the procedure well with no immediate complications.

## 2016-02-04 NOTE — Progress Notes (Signed)
[facility] Progress Note Patient Name: NEKESHA MESTER DOB: November 13, 1929 MRN: [id number]   Date of Service  01/21/2016  HPI/Events of Note  sats dropping on 100% 02 with good waveform and no change vent mechanics  eICU Interventions  Increased peep to 10/ cxr ordered      Intervention Category Major Interventions: Respiratory failure - evaluation and management  Christinia Gully 02/07/2016, 6:05 PM

## 2016-02-04 NOTE — Progress Notes (Signed)
Attempted to band esophageal varices, which were bleeding significantly; Dr Amedeo Plenty then requested that the we attempt to place a [place] tube; tube was placed successfully; pt transferred to ICU

## 2016-02-04 NOTE — Anesthesia Preprocedure Evaluation (Addendum)
Anesthesia Evaluation  Patient identified by MRN, date of birth, ID band Patient awake    Reviewed: Allergy & Precautions, NPO status , Patient's Chart, lab work & pertinent test results, reviewed documented beta blocker date and time   Airway Mallampati: I  TM Distance: >3 FB Neck ROM: Full    Dental  (+) Upper Dentures, Dental Advisory Given   Pulmonary    breath sounds clear to auscultation       Cardiovascular hypertension, Pt. on medications and Pt. on home beta blockers + Peripheral Vascular Disease and +CHF   Rhythm:Regular Rate:Normal  EF 45%   Neuro/Psych PSYCHIATRIC DISORDERS Anxiety negative neurological ROS     GI/Hepatic Neg liver ROS, GERD  Medicated,  Endo/Other  negative endocrine ROS  Renal/GU negative Renal ROS  negative genitourinary   Musculoskeletal  (+) Arthritis , Osteoarthritis,    Abdominal   Peds  Hematology  (+) anemia ,   Anesthesia Other Findings - Dementia - Interstitial Lung Disease  Reproductive/Obstetrics                            Lab Results  Component Value Date   WBC 6.9 01/27/2016   HGB 9.0* 01/20/2016   HCT 28.0* 01/28/2016   MCV 95.2 01/28/2016   PLT 116* 01/24/2016   Lab Results  Component Value Date   CREATININE 0.97 01/26/2016   BUN 25* 01/29/2016   NA 145 01/30/2016   K 4.3 01/25/2016   CL 115* 02/05/2016   CO2 21* 01/20/2016   Lab Results  Component Value Date   INR 1.37 01/13/2016   INR 1.25 01/27/2016   INR 1.13 06/23/2015   01/2016 EKG: normal sinus rhythm, nonspecific ST and T waves changes, frequent PVC's noted.   Anesthesia Physical  Anesthesia Plan  ASA: IV  Anesthesia Plan: General   Post-op Pain Management:    Induction: Intravenous and Rapid sequence  Airway Management Planned: Oral ETT  Additional Equipment:   Intra-op Plan:   Post-operative Plan: Possible Post-op intubation/ventilation  Informed  Consent: I have reviewed the patients History and Physical, chart, labs and discussed the procedure including the risks, benefits and alternatives for the proposed anesthesia with the patient or authorized representative who has indicated his/her understanding and acceptance.   Dental advisory given  Plan Discussed with: CRNA  Anesthesia Plan Comments: (Will perform rapid sequence intubation given history of esophageal bleeding and for airway security)       Anesthesia Quick Evaluation

## 2016-02-04 NOTE — Progress Notes (Signed)
E-link MD made aware of patient's poor urine output (55 ml in 4 hrs), CVP 8, BP 117/54 (80, bladder scan showed no urine in bladder). No new order received, MD advised that output be monitored.

## 2016-02-04 NOTE — Consult Note (Signed)
Chief Complaint: Hematemesis   Referring Physician(s): Dr. Conard Novak, [place]  History of Present Illness: Tiffany [name] is a 82 y.o. female admitted via the ED to [facility], with her second episode of hematemesis within a week.  She was admitted 01/27/2016 with hematemesis, and upper endo and banding performed.  She was discharged 02/02/2016.   She returns again with significant hematemesis at her home approximately 1:30am.  She has undergone repeat upper endo with banding.  Esophageal varices were visualized on endo, with requirement of [place] tube placement during the upper endo.    Currently she is admitted in the ICU for intensive medical care.  She is currently receiving IV PPI, fluid resuscitation, Abx, and octreotide.    Her MELD is calculated ~11.    Her CP is calculated ~ B/8.   Her APACHE II score is ~ 25, which designates 51% mortality.    Prior cardiac ECHO in March 2016 shows EF of 40-45%.  Currently there is a repeat ECHO pending.    Her daughter is Tiffany [name], phone [phone]. Her husband is Juanda Crumble phone [phone].       Past Medical History  Diagnosis Date  . Hypertension   . Arthritis   . Dementia   . Allergic rhinitis   . GI bleed   . Raynaud's disease   . GERD (gastroesophageal reflux disease)   . Acute respiratory failure ([place])   . Pleural effusion   . ILD (interstitial lung disease) ([place])   . Pulmonary edema   . Chronic diastolic (congestive) heart failure ([place])   . Chronic systolic heart failure ([place])   . A-fib ([place])   . Cirrhosis of liver not due to alcohol [facility])     Past Surgical History  Procedure Laterality Date  . Back surgery  09/25/2011  . Abdominal hysterectomy    . Esophagogastroduodenoscopy N/A 03/01/2015    Procedure: ESOPHAGOGASTRODUODENOSCOPY (EGD);  Surgeon: Arta Silence, MD;  Location: [facility] ENDOSCOPY;  Service: Endoscopy;  Laterality: N/A;  . Esophagogastroduodenoscopy N/A 03/02/2015    Procedure:  ESOPHAGOGASTRODUODENOSCOPY (EGD);  Surgeon: Arta Silence, MD;  Location: [facility] ENDOSCOPY;  Service: Endoscopy;  Laterality: N/A;  bedside  . Esophagogastroduodenoscopy (egd) with propofol N/A 06/25/2015    Procedure: ESOPHAGOGASTRODUODENOSCOPY (EGD) WITH PROPOFOL;  Surgeon: Clarene Essex, MD;  Location: [facility] ENDOSCOPY;  Service: Endoscopy;  Laterality: N/A;  . Colonoscopy with propofol N/A 06/27/2015    Procedure: COLONOSCOPY WITH PROPOFOL;  Surgeon: Arta Silence, MD;  Location: [facility] ENDOSCOPY;  Service: Endoscopy;  Laterality: N/A;  . [place] capsule study N/A 06/27/2015    Procedure: GIVENS CAPSULE STUDY;  Surgeon: Arta Silence, MD;  Location: [facility] ENDOSCOPY;  Service: Endoscopy;  Laterality: N/A;  . Esophagogastroduodenoscopy N/A 01/27/2016    Procedure: ESOPHAGOGASTRODUODENOSCOPY (EGD);  Surgeon: Teena Irani, MD;  Location: [facility] ENDOSCOPY;  Service: Endoscopy;  Laterality: N/A;    Allergies: Review of patient's allergies indicates no known allergies.  Medications: Prior to Admission medications   Medication Sig Start Date End Date Taking? Authorizing Provider  ferrous sulfate 325 (65 FE) MG tablet Take 325 mg by mouth daily with breakfast.   Yes Historical Provider, MD  furosemide (LASIX) 40 MG tablet Take 0.5 tablets (20 mg total) by mouth daily. 06/28/15  Yes Barton Dubois, MD  NAMENDA XR 28 MG CP24 24 hr capsule TAKE 1 CAPSULE (28 MG TOTAL) BY MOUTH DAILY. 10/10/15  Yes Adam Telford Nab, DO  pantoprazole (PROTONIX) 40 MG tablet Take 1 tablet (40 mg total) by mouth 2 (two)  times daily. 07/04/15  Yes Kennyth Arnold, FNP  Potassium Chloride ER 20 MEQ TBCR TAKE 1 TABLET (20 MEQ TOTAL) BY MOUTH DAILY. 11/10/15  Yes Noralee Space, MD  propranolol (INDERAL) 20 MG tablet Take 1 tablet (20 mg total) by mouth every 12 (twelve) hours. 02/02/16  Yes Debbe Odea, MD  mirtazapine (REMERON) 15 MG tablet Take 1 tablet (15 mg total) by mouth at bedtime. Patient not taking: Reported on 01/30/2016 11/01/15   Kennyth Arnold,  FNP     Family History  Problem Relation Age of Onset  . Heart attack Brother   . Ovarian cancer Mother   . Osteoarthritis Mother   . Colon cancer Sister     Social History   Social History  . Marital Status: Married    Spouse Name: N/A  . Number of Children: 1  . Years of Education: N/A   Occupational History  . Retired    Social History Main Topics  . Smoking status: Never Smoker   . Smokeless tobacco: Never Used  . Alcohol Use: No  . Drug Use: No  . Sexual Activity: No   Other Topics Concern  . 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    She likes to be outside. Golf   She goes to the [employer]           Review of Systems: A 12 point ROS discussed and pertinent positives are indicated in the HPI above.  All other systems are negative.  Review of Systems   Intubated in the ICU.  Current SBP is 180-200. No hypotension.  [place] tube in place.    Vital Signs: BP 171/87 mmHg  Pulse 77  Temp(Src) 98.2 F (36.8 C) (Oral)  Resp 12  Ht 5\' 1"  (1.549 m)  Wt 147 lb 0.8 oz (66.7 kg)  BMI 27.80 kg/m2  SpO2 98%  Physical Exam  Imaging: Dg Chest 1 View  01/27/2016  CLINICAL DATA:  Acute respiratory arrest with intubation. Current history of hypertension, atrial fibrillation, chronic systolic and diastolic heart failure. EXAM: Portable CHEST 1 VIEW 3:11 p.m.: COMPARISON:  06/23/2015 and earlier. FINDINGS: Endotracheal tube tip is at the carina. Cardiac silhouette mildly to moderately enlarged, unchanged. Dense consolidation in the left lower lobe silhouetting the left hemidiaphragm, associated with a possible left pleural effusion. Pulmonary vascularity normal without evidence of pulmonary edema. Lungs otherwise clear. No right pleural effusion. IMPRESSION: 1. Endotracheal tube tip is at the carina. It should be withdrawn approximately 4-5 cm. 2. Left lower lobe pneumonia with a possible left parapneumonic effusion. 3.  Stable mild to moderate cardiomegaly without pulmonary edema. I telephoned these results at the time of interpretation on 01/27/2016 at 3:36 pm to Lattie Haw, the nurse caring for the patient in the ICU, who verbally acknowledged these results. Electronically Signed   By: Evangeline Dakin M.D.   On: 01/27/2016 15:37   Ct Abd Wo & W Cm  01/31/2016  CLINICAL DATA:  Cirrhosis.  CHF.  Inpatient. EXAM: CT ABDOMEN WITHOUT AND WITH CONTRAST TECHNIQUE: Multidetector CT imaging of the abdomen was performed following the standard protocol before and following the bolus administration of intravenous contrast. CONTRAST:  147mL OMNIPAQUE IOHEXOL 300 MG/ML  SOLN COMPARISON:  01/27/2016 abdominal sonogram. 03/08/2015 high-resolution chest CT study. 03/01/2015 CT abdomen/pelvis. FINDINGS: Lower chest: Partially visualized right pleural effusion, at least moderate to large in size. Small layering left pleural effusion. Complete  right lower lobe atelectasis. Segmental right middle lobe atelectasis. Compressive atelectasis in the dependent left lower lobe. Partially visualized is the tip of a superior approach central venous catheter in the middle third of the superior vena cava. Mild cardiomegaly and coronary atherosclerosis. Trace pericardial fluid/thickening. Fluid throughout the mildly patulous lower thoracic esophagus. Hepatobiliary: Diffusely irregular liver contour, keeping with cirrhosis. Stable granulomatous calcification in the left liver lobe. No arterial phase foci of hyperenhancement in the liver. No liver mass. Mild gallbladder distention. There is a 3 mm density in the dependent gallbladder lumen, which could represent a tiny gallstone (series 3/ image 45). There is mild-to-moderate diffuse gallbladder wall thickening with trace pericholecystic fluid. No biliary ductal dilatation. Pancreas: Normal, with no mass or duct dilation. Spleen: Mild splenomegaly (splenic dimensions 16.0 x 8.2 cm axially and 10.3 cm craniocaudal,  for a splenic volume of 703 cc), increased since 03/01/2015. No splenic mass. Adrenals/Urinary Tract: Normal adrenals. No hydronephrosis. Simple 1.2 cm renal cyst in the lower left kidney. Simple 2.4 cm renal cyst in the posterior interpolar left kidney. Several additional subcentimeter hypodense renal cortical lesions in both kidneys are too small to characterize. Stomach/Bowel: Grossly normal stomach. Visualized small and large bowel is normal caliber, with no bowel wall thickening. Mild scattered diverticulosis in the visualize colon. Vascular/Lymphatic: Atherosclerotic nonaneurysmal abdominal aorta. Patent portal, splenic, hepatic and renal veins. Mild gastroesophageal varices. No pathologically enlarged lymph nodes in the abdomen. Other: No pneumoperitoneum, ascites or focal fluid collection. Musculoskeletal: No aggressive appearing focal osseous lesions. Marked degenerative changes in the visualized thoracolumbar spine. Bilateral posterior spinal fusion hardware in the lower lumbar spine, with no gross evidence of hardware fracture or loosening. IMPRESSION: 1. Cirrhosis.  No liver mass. 2. Patent portal, splenic and hepatic veins. 3. Mild splenomegaly, increased. Stable mild gastroesophageal varices. No ascites. 4. Mildly distended gallbladder with mild to moderate diffuse gallbladder wall thickening and trace pericholecystic fluid. Possible tiny gallstone. These findings are nonspecific. The gallbladder wall thickening could be due to non-inflammatory edema such as due to hypoalbuminemia, although acute cholecystitis cannot be excluded on the basis of this examination. If there is clinical concern for acute cholecystitis, consider correlation with hepatobiliary scintigraphy. 5. Moderate to large right and small left pleural effusions with associated compressive atelectasis. 6. Mild cardiomegaly. Coronary atherosclerosis. Trace pericardial fluid/thickening. 7. Fluid in the lower thoracic esophagus, suggesting  esophageal dysmotility and/or gastroesophageal reflux. Electronically Signed   By: Ilona Sorrel M.D.   On: 01/31/2016 13:18   Dg Chest Port 1 View  01/19/2016  CLINICAL DATA:  Evaluate ET tube and central line EXAM: PORTABLE CHEST 1 VIEW COMPARISON:  February 04, 2016 FINDINGS: The ET tube is in good position. A large bore OG or NG tube terminates in the left upper quadrant. A new right central line terminates in the SVC. No pneumothorax. Haziness over the right hemi thorax is consistent with layering effusion and underlying atelectasis. A calcified nodule is seen in the left lung. No other interval change IMPRESSION: Layering effusion on the right. Appropriate placement of support apparatus. No pneumothorax after line placement. Electronically Signed   By: Dorise Bullion III M.D   On: 01/18/2016 13:28   Dg Chest Port 1 View  01/11/2016  CLINICAL DATA:  82 year old female with shortness of breath and acute respiratory failure. EXAM: PORTABLE CHEST 1 VIEW COMPARISON:  02/01/2016 and prior exams. FINDINGS: This is a mildly low volume film. Cardiomegaly is noted with new mild pulmonary vascular congestion. Bibasilar opacities and probable effusions  present. There is no evidence pneumothorax. No other significant changes identified. IMPRESSION: Cardiomegaly with mild pulmonary vascular congestion. Continued bibasilar atelectasis/ opacities and probable effusions. Electronically Signed   By: Margarette Canada M.D.   On: 01/15/2016 08:19   Dg Chest Port 1 View  02/01/2016  CLINICAL DATA:  Status post thoracentesis EXAM: PORTABLE CHEST 1 VIEW COMPARISON:  02/01/2016 FINDINGS: Small residual pleural effusion on the right significantly decreased after thoracentesis. No pneumothorax identified. Calcified granuloma over the lingula stable. Mild cardiac enlargement. IMPRESSION: No pneumothorax status post thoracentesis Electronically Signed   By: Skipper Cliche M.D.   On: 02/01/2016 12:52   Dg Chest Port 1  View  02/01/2016  CLINICAL DATA:  Pleural effusion EXAM: PORTABLE CHEST 1 VIEW COMPARISON:  January 28, 2016 FINDINGS: There is a large pleural effusion on the right, significantly increased from most recent prior study. A smaller effusion is noted on the left which appears somewhat loculated. No well-defined edema or consolidation is appreciable. A calcified granuloma in the left mid lung region is stable. Heart is borderline enlarged with pulmonary vascularity within normal limits. No adenopathy. There is degenerative change in the thoracic spine. Bones are osteoporotic. IMPRESSION: Sizable pleural effusion on the right, significantly increased from prior study. Apparent loculated effusion left base. No change in cardiac silhouette. Stable granuloma left mid lung. No airspace consolidation is appreciable, although effusion could mask underlying infiltrate. Electronically Signed   By: Lowella Grip III M.D.   On: 02/01/2016 07:17   Dg Chest Port 1 View  01/28/2016  CLINICAL DATA:  Status post extubation.  Near syncope 1 day ago. EXAM: PORTABLE CHEST 1 VIEW COMPARISON:  Earlier today. FINDINGS: The endotracheal tube has been removed. Stable right jugular catheter. The cardiac silhouette remains borderline enlarged. Stable oval calcification overlying the heart and left lower lung zone. Minimal increase in prominence of the pulmonary vasculature and interstitial markings. Old, healed right rib fractures. Thoracolumbar spine degenerative changes. IMPRESSION: 1. Interval mild pulmonary vascular congestion and minimal interstitial pulmonary edema. 2. Stable borderline cardiomegaly. Electronically Signed   By: Claudie Revering M.D.   On: 01/28/2016 10:29   Dg Chest Port 1 View  01/28/2016  CLINICAL DATA:  Acute respiratory failure EXAM: PORTABLE CHEST 1 VIEW COMPARISON:  01/27/2016 FINDINGS: Right jugular venous catheter and endotracheal tube are stable. Left basilar consolidation has improved. Right basilar  atelectasis has increased with lower lung volumes. No pneumothorax. IMPRESSION: Improved left basilar consolidation. Increasing right basilar atelectasis. Electronically Signed   By: Marybelle Killings M.D.   On: 01/28/2016 08:17   Dg Chest Port 1 View  01/27/2016  CLINICAL DATA:  Central line placement. EXAM: PORTABLE CHEST 1 VIEW COMPARISON:  01/27/2016 at 1511 hours FINDINGS: The endotracheal tube has been withdrawn. Tip now projects 3.5 cm above the carina. Right internal jugular central venous line tip projects the lower superior vena cava. No pneumothorax Left lung base consolidation is without change previous exam. No new lung abnormalities. IMPRESSION: 1. Endotracheal tube now well positioned with its tip 3.5 cm above the carina. 2. New right internal jugular central venous line tip lies in the lower superior vena cava. No pneumothorax. Electronically Signed   By: Lajean Manes M.D.   On: 01/27/2016 16:55   Dg Abd Portable 1v  02/01/2016  CLINICAL DATA:  GI bleed. [place] tube placement for gastric bleeding EXAM: PORTABLE ABDOMEN - 1 VIEW COMPARISON:  None. FINDINGS: Enteric tube is in place, coiling in the proximal stomach. Nonobstructive bowel gas pattern.  Bilateral lower lobe airspace opacities noted. IMPRESSION: Enteric tube coiling within the proximal stomach. Electronically Signed   By: Rolm Baptise M.D.   On: 02/05/2016 12:21   US Abdomen Limited Ruq  01/27/2016  CLINICAL DATA:  82 year old female with nausea and vomiting. EXAM: US ABDOMEN LIMITED - RIGHT UPPER QUADRANT COMPARISON:  CT dated 03/01/2015 FINDINGS: Gallbladder: There is diffuse edematous appearance and thickening of the gallbladder wall. The gallbladder wall measures approximately 11 mm in thickness. There is no gallstone or pericholecystic fluid. Evaluation for Murphy's sign was limited as the patient is vented. Common bile duct: Diameter: 4 mm. Liver: The liver demonstrates a heterogeneous echotexture. IMPRESSION: Diffuse  thickening of the gallbladder wall may be related to underlying cirrhosis or represent chronic cholecystitis. An acalculous cholecystitis is not excluded. Clinical correlation is recommended. Heterogeneous liver secondary to underlying cirrhosis. Electronically Signed   By: Anner Crete M.D.   On: 01/27/2016 18:58    Labs:  CBC:  Recent Labs  02/01/16 0419 02/02/16 0405 01/21/2016 0157 01/14/2016 0458  WBC 6.5 6.0 13.3* 6.9  HGB 10.8* 10.4* 10.9* 9.0*  HCT 32.7* 32.6* 34.1* 28.0*  PLT 97* 104* 235 116*    COAGS:  Recent Labs  03/01/15 1400 03/03/15 0330 06/23/15 2120 01/27/16 1124 01/14/2016 0458  INR 1.25 1.30 1.13 1.25 1.37  APTT 30 30 27   --  27    BMP:  Recent Labs  02/01/16 0419 02/02/16 0405 02/02/2016 0157 01/12/2016 0458  NA 145 147* 149* 145  K 3.3* 3.9 4.0 4.3  CL 112* 115* 117* 115*  CO2 24 24 21* 21*  GLUCOSE 122* 101* 159* 142*  BUN 16 18 23* 25*  CALCIUM 8.4* 8.4* 8.9 8.0*  CREATININE 0.92 0.97 1.13* 0.97  GFRNONAA 56* 52* 43* 52*  GFRAA >60 >60 50* >60    LIVER FUNCTION TESTS:  Recent Labs  01/27/16 1124 01/31/16 0500 02/02/16 0405 01/26/2016 0157 01/14/2016 0458  BILITOT 0.8 0.7  --  0.6 0.6  AST 23 34  --  22 18  ALT 14 23  --  16 14  ALKPHOS 103 83  --  115 101  PROT 6.0* 5.5* 5.4* 5.9* 5.1*  ALBUMIN 3.7 3.1*  --  3.3* 2.9*    TUMOR MARKERS: No results for input(s): AFPTM, CEA, CA199, CHROMGRNA in the last 8760 hours.  Assessment and Plan:  Ms Piascik is an 82 year old female with her second admission in 1 week for acute hematemesis secondary to esophageal variceal bleeding.  She has a diagnosis of cryptogenic cirrhosis.  Upper endo has been performed x2 with banding, with the most recent today requiring [place] tube placement.  Currently this has temporized the bleeding.    A CT has been reviewed which shows appropriate anatomy for TIPS, which is technically feasible.  MELD is 11, with normal T bili. Previous ECHO has shown EF of  40-45%.  I believe that the highest risk for TIPS as a solution to the bleeding would be her cardiac function, possible encephalopathy, and less likely liver failure.  She has demonstrated failure of medical therapy for the variceal bleeding, with rapid re-bleeding in the past week.    I have discussed her options with her daughter, who voiced her understanding of her critical condition.  She would like to speak to her family regarding further care/therapy, including possible TIPS with her family, after the ECHO has been performed.  She understands she is at risk for further bleeding.  We will discuss  options after the ECHO.    Discussed with Dr. Chase Caller.    VIR will follow.    Call with questions/concerns.   Thank you for this interesting consult.  I greatly enjoyed meeting HILERY BUSSING and look forward to participating in their care.  A copy of this report was sent to the requesting provider on this date.  Electronically Signed: Corrie Mckusick 01/16/2016, 2:51 PM   I spent a total of 80 Minutes    in face to face in clinical consultation, greater than 50% of which was counseling/coordinating care for hematemesis, portal HTN, esophageal hemorrhage, possible TIPS, possible splenic embo.

## 2016-02-04 NOTE — Transfer of Care (Signed)
Immediate Anesthesia Transfer of Care Note  Patient: Tiffany [name]  Procedure(s) Performed: Procedure(s): UPPER ENDOSCOPY WITH CAUTERIZATION OF BLEEDING (N/A)  Patient Location: PACU and ICU  Anesthesia Type:General  Level of Consciousness: sedated  Airway & Oxygen Therapy: Patient remains intubated per anesthesia plan and Patient placed on Ventilator (see vital sign flow sheet for setting)  Post-op Assessment: Report given to RN and Post -op Vital signs reviewed and stable  Post vital signs: Reviewed and stable  Last Vitals:  Filed Vitals:   01/13/2016 0945 01/30/2016 1304  BP: 110/65 171/87  Pulse: 73 77  Temp:    Resp: 17 12    Complications: No apparent anesthesia complications

## 2016-02-04 NOTE — H&P (Signed)
[facility] History and Physical  ANADALAY PELAN [id number] DOB: September 01, 1929 DOA: 01/24/2016  Referring physician: ED physician PCP: Kennyth Arnold, FNP  Specialists:   Chief Complaint: Hematemesis  HPI: Tiffany [name] is a 82 y.o. female with PMH of nonalcoholic liver cirrhosis, hypertension, chronic combined systolic and diastolic heart failure with EF 40-45 percent, atrial fibrillation, arthritis, dementia, seasonal allergies, Raynaud's disease, GERD, recent upper GIB from esophageal varices (s/p banding), who presents with hematemesis.  Patientrecently hospitalized from 2/17-2/23/17 due to upper GI bleeding secondary to esophageal viruses. She had EGD on 01/27/16 and s/p of banding. She was intubated post endoscopy procedure. She also had right-sided thoracentesis due to pleural effusion, which was transudative. She was discharged on Inderal at stable condition. Patient has been doing fine until last night, when she had another episode of hematemesis with large amount of bright red blood at about 1:30 AM. Patient has generalized weakness, burping a lot, but no nausea, abdominal pain, diarrhea, fever or chills. No chest pain, cough, symptoms of UTI or unilateral weakness. She has some mild shortness of breath.  In ED, patient was found to have hemoglobin 10.4 on 02/02/16-->10.9, subsequent blood pressure 96/45, WBC 13.3, platelet 235, heart rate at 80, mildly tachypnea, oxygen desaturation to 84% on room air, which improved to 93 on 2 L oxygen, sodium 149, potassium normal, acute renal injury with creatinine 1.13. Patient's admitted to inpatient for further evaluation and treatment.  EKG: Independently reviewed. QTC 568, LAD, low voltage, poor R-wave progression  Where does patient live?   At home  Can patient participate in ADLs?  Barely   Review of Systems:   General: no fevers, chills, no changes in body weight, has poor appetite, has fatigue HEENT: no blurry vision, hearing  changes or sore throat Pulm: has dyspnea, no coughing, wheezing CV: no chest pain, no palpitations Abd: no nausea, abdominal pain, diarrhea, constipation. has hematemesis  GU: no dysuria, burning on urination, increased urinary frequency, hematuria  Ext: no leg edema Neuro: no unilateral weakness, numbness, or tingling, no vision change or hearing loss Skin: no rash MSK: No muscle spasm, no deformity, no limitation of range of movement in spin Heme: No easy bruising.  Travel history: No recent long distant travel.  Allergy: No Known Allergies  Past Medical History  Diagnosis Date  . Hypertension   . Arthritis   . Dementia   . Allergic rhinitis   . GI bleed   . Raynaud's disease   . GERD (gastroesophageal reflux disease)   . Acute respiratory failure ([place])   . Pleural effusion   . ILD (interstitial lung disease) ([place])   . Pulmonary edema   . Chronic diastolic (congestive) heart failure ([place])   . Chronic systolic heart failure ([place])   . A-fib ([place])   . Cirrhosis of liver not due to alcohol [facility])     Past Surgical History  Procedure Laterality Date  . Back surgery  09/25/2011  . Abdominal hysterectomy    . Esophagogastroduodenoscopy N/A 03/01/2015    Procedure: ESOPHAGOGASTRODUODENOSCOPY (EGD);  Surgeon: Arta Silence, MD;  Location: [facility];  Service: Endoscopy;  Laterality: N/A;  . Esophagogastroduodenoscopy N/A 03/02/2015    Procedure: ESOPHAGOGASTRODUODENOSCOPY (EGD);  Surgeon: Arta Silence, MD;  Location: [facility] ENDOSCOPY;  Service: Endoscopy;  Laterality: N/A;  bedside  . Esophagogastroduodenoscopy (egd) with propofol N/A 06/25/2015    Procedure: ESOPHAGOGASTRODUODENOSCOPY (EGD) WITH PROPOFOL;  Surgeon: Clarene Essex, MD;  Location: [facility] ENDOSCOPY;  Service: Endoscopy;  Laterality: N/A;  .  Colonoscopy with propofol N/A 06/27/2015    Procedure: COLONOSCOPY WITH PROPOFOL;  Surgeon: Arta Silence, MD;  Location[facility] ENDOSCOPY;  Service: Endoscopy;  Laterality: N/A;  . Givens  capsule study N/A 06/27/2015    Procedure: GIVENS CAPSULE STUDY;  Surgeon: Arta Silence, MD;  Location: [facility] ENDOSCOPY;  Service: Endoscopy;  Laterality: N/A;  . Esophagogastroduodenoscopy N/A 01/27/2016    Procedure: ESOPHAGOGASTRODUODENOSCOPY (EGD);  Surgeon: Teena Irani, MD;  Location: [facility] ENDOSCOPY;  Service: Endoscopy;  Laterality: N/A;    Social History:  reports that she has never smoked. She has never used smokeless tobacco. She reports that she does not drink alcohol or use illicit drugs.  Family History:  Family History  Problem Relation Age of Onset  . Heart attack Brother   . Ovarian cancer Mother   . Osteoarthritis Mother   . Colon cancer Sister      Prior to Admission medications   Medication Sig Start Date End Date Taking? Authorizing Provider  ferrous sulfate 325 (65 FE) MG tablet Take 325 mg by mouth daily with breakfast.   Yes Historical Provider, MD  furosemide (LASIX) 40 MG tablet Take 0.5 tablets (20 mg total) by mouth daily. 06/28/15  Yes Barton Dubois, MD  NAMENDA XR 28 MG CP24 24 hr capsule TAKE 1 CAPSULE (28 MG TOTAL) BY MOUTH DAILY. 10/10/15  Yes Adam Telford Nab, DO  pantoprazole (PROTONIX) 40 MG tablet Take 1 tablet (40 mg total) by mouth 2 (two) times daily. 07/04/15  Yes Kennyth Arnold, FNP  Potassium Chloride ER 20 MEQ TBCR TAKE 1 TABLET (20 MEQ TOTAL) BY MOUTH DAILY. 11/10/15  Yes Noralee Space, MD  propranolol (INDERAL) 20 MG tablet Take 1 tablet (20 mg total) by mouth every 12 (twelve) hours. 02/02/16  Yes Debbe Odea, MD  mirtazapine (REMERON) 15 MG tablet Take 1 tablet (15 mg total) by mouth at bedtime. Patient not taking: Reported on 01/11/2016 11/01/15   Kennyth Arnold, FNP    Physical Exam: Filed Vitals:   01/18/2016 0230 01/30/2016 0232 01/15/2016 0300 01/25/2016 0502  BP: 125/60 125/60 107/56 107/48  Pulse: 68 75 80 76  Temp:    98.2 F (36.8 C)  TempSrc:    Oral  Resp: 8 18 23 18   SpO2: 84% 91% 93% 99%   General: Not in acute distress. Pale  looking. HEENT:       Eyes: PERRL, EOMI, no scleral icterus.       ENT: No discharge from the ears and nose, no pharynx injection, no tonsillar enlargement.        Neck: No JVD, no bruit, no mass felt. Heme: No neck lymph node enlargement. Cardiac: S1/S2, RRR, No murmurs, No gallops or rubs. Pulm: No rales, wheezing, rhonchi or rubs. Abd: Soft, nondistended, nontender, no rebound pain, no organomegaly, BS present. Ext: No pitting leg edema bilaterally. 2+DP/PT pulse bilaterally. Musculoskeletal: No joint deformities, No joint redness or warmth, no limitation of ROM in spin. Skin: No rashes.  Neuro: Alert, oriented X3, cranial nerves II-XII grossly intact, moves all extremities normally. Psych: Patient is not psychotic, no suicidal or hemocidal ideation.  Labs on Admission:  Basic Metabolic Panel:  Recent Labs Lab 01/30/16 0438 01/31/16 0500 02/01/16 0419 02/02/16 0405 01/25/2016 0157  NA 146* 146* 145 147* 149*  K 3.8 3.4* 3.3* 3.9 4.0  CL 117* 116* 112* 115* 117*  CO2 24 24 24 24  21*  GLUCOSE 119* 120* 122* 101* 159*  BUN 22* 15 16 18  23*  CREATININE  0.86 0.75 0.92 0.97 1.13*  CALCIUM 8.5* 8.4* 8.4* 8.4* 8.9   Liver Function Tests:  Recent Labs Lab 01/31/16 0500 02/02/16 0405 01/16/2016 0157  AST 34  --  22  ALT 23  --  16  ALKPHOS 83  --  115  BILITOT 0.7  --  0.6  PROT 5.5* 5.4* 5.9*  ALBUMIN 3.1*  --  3.3*   No results for input(s): LIPASE, AMYLASE in the last 168 hours. No results for input(s): AMMONIA in the last 168 hours. CBC:  Recent Labs Lab 01/31/16 0735 02/01/16 0419 02/02/16 0405 01/27/2016 0157 01/14/2016 0458  WBC 6.6 6.5 6.0 13.3* 6.9  HGB 10.5* 10.8* 10.4* 10.9* 9.0*  HCT 32.8* 32.7* 32.6* 34.1* 28.0*  MCV 95.3 94.2 94.8 94.5 95.2  PLT 93* 97* 104* 235 116*   Cardiac Enzymes: No results for input(s): CKTOTAL, CKMB, CKMBINDEX, TROPONINI in the last 168 hours.  BNP (last 3 results)  Recent Labs  03/01/15 1800 03/08/15 0407 01/27/16 1124   BNP 55.6 567.5* 75.5    ProBNP (last 3 results)  Recent Labs  03/31/15 1247  PROBNP 265.0*    CBG:  Recent Labs Lab 01/28/16 0752 01/28/16 1147 01/28/16 1657  GLUCAP 130* 73 174*    Radiological Exams on Admission: No results found.  Assessment/Plan Principal Problem:   GIB (gastrointestinal bleeding) Active Problems:   Pleural effusion   ILD (interstitial lung disease) (HCC)   Alzheimer's dementia   Essential hypertension   Esophageal reflux   Acute blood loss anemia   Cirrhosis (HCC)   Esophageal varices in cirrhosis (HCC)   Chronic combined systolic and diastolic CHF (congestive heart failure) (HCC)   Hematemesis   Leukocytosis   A-fib (HCC)   Hypernatremia   GIB (gastrointestinal bleeding): Most likely due to upper GI bleeding from esophageal varices. Hemoglobin stable 10.4-->10.9. Blood pressure soft, but no tachycardia. GI, Dr. Amedeo Plenty was consulted by EDP, will see in AM  - will admit to SDU since pt is at high risk of having recurrent massive upper GI bleeding and deterioration - GI consulted by Ed, will follow up recommendations - NPO - NS: NS 500 cc and then D5-1/2 at 75 cc for 6 hrs (pt's sodium is 149) -  IV pantoprazole gtt and octretide were started in ED - prn hydroxyzine for nausea (patient has QT prolongation, 568, not good candidate for using Zofran). - Avoid NSAIDs and SQ heparin - Maintain IV access (2 large bore IVs if possible). - Monitor closely and follow q6h cbc, transfuse as necessary. - INR/PTT/type & screen - IV Rocephin daily SBP PPx  - hold propranolol due to soft tissue blood pressure  Hypernatremia: Sodium 149, likely due to dehydration. Mental status normal. -D5-1/2 at 75 cc for 6 hrs  -f/u by BMP  Nonalcoholic Cirrhosis of the liver: Etiology uncertain. Child Pugh class A. Hepatitis panel was negative -on Rocephin for SBP PPx -f/u with GI  Leukocytosis: WBC 13.3, but no signs of infection. Likely due to  de-margination.  -check UA -CXR  Pleural effusion: She had right-sided thoracentesis due to pleural effusion, which was transudative. She has mild SOB toady with oxygen desaturation to 84 on room air, but improved to 93 on 2 L oxygen. -f/u CXR -nasal canula Oxgen  Chronic combined systolic and diastolic CHF (congestive heart failure): 2-D echo on 03/06/15 showed a year for 48-45 percent with grade 1 diastolic dysfunction. Patient does not have leg edema. CHF is compensated. -Hold Lasix due to worsening  renal function, soft blood pressure -Check BNP  Hypertension: Blood pressures are soft -Hold Lasix and propranolol -Monitor blood pressure closely  Atrial Fibrillation: CHA2DS2-VASc Score is 4 , needs oral anticoagulation, but have to hold due to GIB. Heart rate is controlled. -tele monitoring  DVT ppx: SCD  Code Status: Full code (pt had partial code in the past, but she wants to be full code on this admission) Family Communication: None at bed side.  Disposition Plan: Admit to inpatient   Date of Service 01/20/2016    Ivor Costa [facility] Pager [phone]  If 7PM-7AM, please contact night-coverage [email or web address] Password [facility] 01/18/2016, 5:49 AM

## 2016-02-04 NOTE — Progress Notes (Signed)
[place] Progress Note Patient Name: Tiffany [name] DOB: 02/20/1929 MRN: [id number]   Date of Service  01/11/2016  HPI/Events of Note  Hypertension in gib  eICU Interventions  Add IV lopressor      Intervention Category Major Interventions: Hypertension - evaluation and management  Christinia Gully 01/20/2016, 4:18 PM

## 2016-02-04 NOTE — Progress Notes (Signed)
Pt intubated and resting; no family present. Please page if assistance if needed prior to next visit. Chaplain Ernest Haber, M.Div.

## 2016-02-04 NOTE — Anesthesia Postprocedure Evaluation (Addendum)
Anesthesia Post Note  Patient: Tiffany [name]  Procedure(s) Performed: Procedure(s) (LRB): UPPER ENDOSCOPY WITH CAUTERIZATION OF BLEEDING (N/A)  Patient location during evaluation: SICU Anesthesia Type: General Level of consciousness: sedated Pain management: pain level controlled Vital Signs Assessment: post-procedure vital signs reviewed and stable Respiratory status: patient remains intubated per anesthesia plan Cardiovascular status: stable Anesthetic complications: no Comments: Currently patient is stable hemodynamically, HR 80s, sinus rhythm, esophageal tamponade balloon in place, right radial A line, RIJ double lumen confirmed with ultrasound with free flowing venous blood.. Ms. Voorhees is to be transported to the ICU intubated on propofol infusion, with beta blocker medication as needed for hypertensive blood pressure, has received 2 units of blood, no vasopressor medication  B Shirely Toren, MD    Last Vitals:  Filed Vitals:   01/31/2016 0740 01/21/2016 0945  BP: 111/51 110/65  Pulse: 70 73  Temp:    Resp: 19 17    Last Pain: There were no vitals filed for this visit.               Zenaida Deed

## 2016-02-04 NOTE — Progress Notes (Signed)
[place] Progress Note Patient Name: Tiffany [name] DOB: 01/18/1929 MRN: [id number]   Date of Service  01/25/2016  HPI/Events of Note  uop dropping  Intake/Output Summary (Last 24 hours) at 01/12/2016 1807 Last data filed at 01/17/2016 1700  Gross per 24 hour  Intake 3048.59 ml  Output    680 ml  Net 2368.59 ml      Lab Results  Component Value Date   HGB 13.7 01/22/2016   HGB 9.0* 01/15/2016   HGB 10.9* 02/02/2016   HGB 11.7 07/21/2015     Lab Results  Component Value Date   CREATININE 0.97 01/12/2016   CREATININE 1.13* 01/19/2016   CREATININE 0.97 02/02/2016   CREATININE 1.4* 07/21/2015     eICU Interventions  Check cvp before more fluid as gas exchange deteriorating      Intervention Category Major Interventions: Shock - evaluation and management  Christinia Gully 01/17/2016, 6:07 PM

## 2016-02-05 ENCOUNTER — Inpatient Hospital Stay (HOSPITAL_COMMUNITY): Payer: PPO

## 2016-02-05 DIAGNOSIS — I8510 Secondary esophageal varices without bleeding: Secondary | ICD-10-CM

## 2016-02-05 DIAGNOSIS — J80 Acute respiratory distress syndrome: Secondary | ICD-10-CM

## 2016-02-05 DIAGNOSIS — J90 Pleural effusion, not elsewhere classified: Secondary | ICD-10-CM

## 2016-02-05 DIAGNOSIS — K7460 Unspecified cirrhosis of liver: Secondary | ICD-10-CM

## 2016-02-05 LAB — CBC WITH DIFFERENTIAL/PLATELET
BASOS ABS: 0 10*3/uL (ref 0.0–0.1)
Basophils Relative: 0 %
Eosinophils Absolute: 0 10*3/uL (ref 0.0–0.7)
Eosinophils Relative: 0 %
HCT: 38.9 % (ref 36.0–46.0)
Hemoglobin: 12.5 g/dL (ref 12.0–15.0)
LYMPHS ABS: 1 10*3/uL (ref 0.7–4.0)
LYMPHS PCT: 6 %
MCH: 30 pg (ref 26.0–34.0)
MCHC: 32.1 g/dL (ref 30.0–36.0)
MCV: 93.5 fL (ref 78.0–100.0)
Monocytes Absolute: 0.9 10*3/uL (ref 0.1–1.0)
Monocytes Relative: 6 %
NEUTROS ABS: 13.4 10*3/uL — AB (ref 1.7–7.7)
Neutrophils Relative %: 88 %
PLATELETS: 126 10*3/uL — AB (ref 150–400)
RBC: 4.16 MIL/uL (ref 3.87–5.11)
RDW: 16.8 % — AB (ref 11.5–15.5)
WBC: 15.3 10*3/uL — AB (ref 4.0–10.5)

## 2016-02-05 LAB — CBC
HCT: 37.9 % (ref 36.0–46.0)
HEMOGLOBIN: 12.1 g/dL (ref 12.0–15.0)
MCH: 30 pg (ref 26.0–34.0)
MCHC: 31.9 g/dL (ref 30.0–36.0)
MCV: 93.8 fL (ref 78.0–100.0)
Platelets: 134 10*3/uL — ABNORMAL LOW (ref 150–400)
RBC: 4.04 MIL/uL (ref 3.87–5.11)
RDW: 16.8 % — ABNORMAL HIGH (ref 11.5–15.5)
WBC: 14.4 10*3/uL — AB (ref 4.0–10.5)

## 2016-02-05 LAB — BASIC METABOLIC PANEL
ANION GAP: 7 (ref 5–15)
BUN: 23 mg/dL — ABNORMAL HIGH (ref 6–20)
CALCIUM: 7.8 mg/dL — AB (ref 8.9–10.3)
CO2: 19 mmol/L — ABNORMAL LOW (ref 22–32)
Chloride: 119 mmol/L — ABNORMAL HIGH (ref 101–111)
Creatinine, Ser: 0.91 mg/dL (ref 0.44–1.00)
GFR, EST NON AFRICAN AMERICAN: 56 mL/min — AB (ref >60)
Glucose, Bld: 148 mg/dL — ABNORMAL HIGH (ref 65–99)
Potassium: 4.7 mmol/L (ref 3.5–5.1)
SODIUM: 145 mmol/L (ref 135–145)

## 2016-02-05 LAB — BLOOD GAS, ARTERIAL
Acid-base deficit: 6.7 mmol/L — ABNORMAL HIGH (ref 0.0–2.0)
Bicarbonate: 18.5 mEq/L — ABNORMAL LOW (ref 20.0–24.0)
DRAWN BY: 422461
FIO2: 0.8
O2 SAT: 94 %
PCO2 ART: 37.5 mmHg (ref 35.0–45.0)
PEEP: 10 cmH2O
PO2 ART: 77.1 mmHg — AB (ref 80.0–100.0)
Patient temperature: 98.6
RATE: 12 resp/min
TCO2: 17 mmol/L (ref 0–100)
VT: 380 mL
pH, Arterial: 7.313 — ABNORMAL LOW (ref 7.350–7.450)

## 2016-02-05 LAB — GLUCOSE, CAPILLARY
GLUCOSE-CAPILLARY: 103 mg/dL — AB (ref 65–99)
GLUCOSE-CAPILLARY: 123 mg/dL — AB (ref 65–99)
GLUCOSE-CAPILLARY: 128 mg/dL — AB (ref 65–99)
GLUCOSE-CAPILLARY: 129 mg/dL — AB (ref 65–99)
GLUCOSE-CAPILLARY: 97 mg/dL (ref 65–99)
Glucose-Capillary: 138 mg/dL — ABNORMAL HIGH (ref 65–99)
Glucose-Capillary: 125 mg/dL — ABNORMAL HIGH (ref 65–99)

## 2016-02-05 LAB — HEPATIC FUNCTION PANEL
ALBUMIN: 2.8 g/dL — AB (ref 3.5–5.0)
ALK PHOS: 91 U/L (ref 38–126)
ALT: 13 U/L — ABNORMAL LOW (ref 14–54)
AST: 22 U/L (ref 15–41)
BILIRUBIN INDIRECT: 0.9 mg/dL (ref 0.3–0.9)
BILIRUBIN TOTAL: 1 mg/dL (ref 0.3–1.2)
Bilirubin, Direct: 0.1 mg/dL (ref 0.1–0.5)
Total Protein: 5.1 g/dL — ABNORMAL LOW (ref 6.5–8.1)

## 2016-02-05 LAB — TROPONIN I
TROPONIN I: 0.99 ng/mL — AB (ref <0.031)
Troponin I: 1.3 ng/mL (ref <0.031)

## 2016-02-05 LAB — PROTIME-INR
INR: 1.28 (ref 0.00–1.49)
Prothrombin Time: 15.6 seconds — ABNORMAL HIGH (ref 11.6–15.2)

## 2016-02-05 LAB — MAGNESIUM: MAGNESIUM: 1.7 mg/dL (ref 1.7–2.4)

## 2016-02-05 LAB — UREA NITROGEN, URINE: Urea Nitrogen, Ur: 925 mg/dL

## 2016-02-05 LAB — LACTIC ACID, PLASMA: LACTIC ACID, VENOUS: 1.2 mmol/L (ref 0.5–2.0)

## 2016-02-05 LAB — PHOSPHORUS: Phosphorus: 3.3 mg/dL (ref 2.5–4.6)

## 2016-02-05 MED ORDER — CHLORHEXIDINE GLUCONATE 0.12% ORAL RINSE (MEDLINE KIT)
15.0000 mL | Freq: Two times a day (BID) | OROMUCOSAL | Status: DC
  Administered 2016-02-05 – 2016-02-09 (×9): 15 mL via OROMUCOSAL

## 2016-02-05 MED ORDER — MAGNESIUM SULFATE 2 GM/50ML IV SOLN
2.0000 g | Freq: Once | INTRAVENOUS | Status: AC
  Administered 2016-02-05: 2 g via INTRAVENOUS
  Filled 2016-02-05: qty 50

## 2016-02-05 MED ORDER — ANTISEPTIC ORAL RINSE SOLUTION (CORINZ)
7.0000 mL | OROMUCOSAL | Status: DC
  Administered 2016-02-05 – 2016-02-09 (×37): 7 mL via OROMUCOSAL

## 2016-02-05 MED ORDER — FUROSEMIDE 10 MG/ML IJ SOLN
20.0000 mg | Freq: Once | INTRAMUSCULAR | Status: AC
  Administered 2016-02-05: 20 mg via INTRAVENOUS
  Filled 2016-02-05: qty 2

## 2016-02-05 NOTE — Progress Notes (Signed)
Utilization review completed.  

## 2016-02-05 NOTE — Progress Notes (Signed)
[facility] Progress Note  Subjective: Patient stable without pressors, no bowel movement, no oral or nasal output  Objective: Vital signs in last 24 hours: Temp:  [94.6 F (34.8 C)-98.6 F (37 C)] 97.9 F (36.6 C) (02/26 0821) Pulse Rate:  [73-99] 73 (02/26 0900) Resp:  [11-22] 19 (02/26 0900) BP: (94-171)/(48-87) 94/48 mmHg (02/26 0412) SpO2:  [81 %-97 %] 95 % (02/26 0926) Arterial Line BP: (97-156)/(50-74) 110/57 mmHg (02/26 0900) FiO2 (%):  [70 %-100 %] 70 % (02/26 0926) Weight:  [66.7 kg (147 lb 0.8 oz)-69.7 kg (153 lb 10.6 oz)] 69.7 kg (153 lb 10.6 oz) (02/26 0500) Weight change:    PE: Vital signs stable, abdomen soft  Lab Results: Results for orders placed or performed during the hospital encounter of 02/02/2016 (from the past 24 hour(s))  Prepare RBC     Status: None   Collection Time: 01/28/2016 11:30 AM  Result Value Ref Range   Order Confirmation ORDER PROCESSED BY BLOOD BANK   Blood gas, arterial     Status: Abnormal   Collection Time: 01/31/2016  1:30 PM  Result Value Ref Range   FIO2 1.00    Delivery systems VENTILATOR    Mode PRESSURE REGULATED VOLUME CONTROL    VT 380 mL   LHR 12 resp/min   Peep/cpap 5.0 cm H20   pH, Arterial 7.274 (L) 7.350 - 7.450   pCO2 arterial 46.9 (H) 35.0 - 45.0 mmHg   pO2, Arterial 135 (H) 80.0 - 100.0 mmHg   Bicarbonate 21.0 20.0 - 24.0 mEq/L   TCO2 19.4 0 - 100 mmol/L   Acid-base deficit 5.4 (H) 0.0 - 2.0 mmol/L   O2 Saturation 98.1 %   Patient temperature 98.6    Collection site A-LINE    Drawn by [id number]    Sample type ARTERIAL DRAW    Allens test (pass/fail) PASS PASS  Urea nitrogen, urine     Status: None   Collection Time: 01/12/2016  2:15 PM  Result Value Ref Range   Urea Nitrogen, Ur 925 Not Estab. mg/dL  Lactic acid, plasma     Status: None   Collection Time: 02/03/2016  2:20 PM  Result Value Ref Range   Lactic Acid, Venous 1.3 0.5 - 2.0 mmol/L  Troponin I     Status: Abnormal   Collection Time: 01/14/2016   2:20 PM  Result Value Ref Range   Troponin I 0.10 (H) <0.031 ng/mL  CBC with Differential/Platelet     Status: Abnormal   Collection Time: 01/17/2016  2:20 PM  Result Value Ref Range   WBC 23.9 (H) 4.0 - 10.5 K/uL   RBC 4.52 3.87 - 5.11 MIL/uL   Hemoglobin 13.7 12.0 - 15.0 g/dL   HCT 42.3 36.0 - 46.0 %   MCV 93.6 78.0 - 100.0 fL   MCH 30.3 26.0 - 34.0 pg   MCHC 32.4 30.0 - 36.0 g/dL   RDW 16.3 (H) 11.5 - 15.5 %   Platelets 251 150 - 400 K/uL   Neutrophils Relative % 66 %   Neutro Abs 15.9 (H) 1.7 - 7.7 K/uL   Lymphocytes Relative 22 %   Lymphs Abs 5.3 (H) 0.7 - 4.0 K/uL   Monocytes Relative 8 %   Monocytes Absolute 2.0 (H) 0.1 - 1.0 K/uL   Eosinophils Relative 3 %   Eosinophils Absolute 0.8 (H) 0.0 - 0.7 K/uL   Basophils Relative 0 %   Basophils Absolute 0.1 0.0 - 0.1 K/uL  Protime-INR  Status: Abnormal   Collection Time: 01/23/2016  2:20 PM  Result Value Ref Range   Prothrombin Time 15.6 (H) 11.6 - 15.2 seconds   INR 1.27 0.00 - 1.49  Hepatic function panel     Status: Abnormal   Collection Time: 01/23/2016  2:20 PM  Result Value Ref Range   Total Protein 6.1 (L) 6.5 - 8.1 g/dL   Albumin 3.3 (L) 3.5 - 5.0 g/dL   AST 22 15 - 41 U/L   ALT 15 14 - 54 U/L   Alkaline Phosphatase 105 38 - 126 U/L   Total Bilirubin 0.9 0.3 - 1.2 mg/dL   Bilirubin, Direct 0.2 0.1 - 0.5 mg/dL   Indirect Bilirubin 0.7 0.3 - 0.9 mg/dL  Creatinine, urine, random     Status: None   Collection Time: 02/03/2016  2:25 PM  Result Value Ref Range   Creatinine, Urine 158.54 mg/dL  Urinalysis, Routine w reflex microscopic (not at [facility])     Status: Abnormal   Collection Time: 01/17/2016  2:25 PM  Result Value Ref Range   Color, Urine AMBER (A) YELLOW   APPearance CLEAR CLEAR   Specific Gravity, Urine 1.028 1.005 - 1.030   pH 5.5 5.0 - 8.0   Glucose, UA NEGATIVE NEGATIVE mg/dL   Hgb urine dipstick NEGATIVE NEGATIVE   Bilirubin Urine NEGATIVE NEGATIVE   Ketones, ur NEGATIVE NEGATIVE mg/dL   Protein, ur  NEGATIVE NEGATIVE mg/dL   Nitrite NEGATIVE NEGATIVE   Leukocytes, UA NEGATIVE NEGATIVE  Glucose, capillary     Status: Abnormal   Collection Time: 01/16/2016  3:52 PM  Result Value Ref Range   Glucose-Capillary 154 (H) 65 - 99 mg/dL  CBC     Status: Abnormal   Collection Time: 01/18/2016  6:30 PM  Result Value Ref Range   WBC 26.0 (H) 4.0 - 10.5 K/uL   RBC 4.52 3.87 - 5.11 MIL/uL   Hemoglobin 13.6 12.0 - 15.0 g/dL   HCT 41.9 36.0 - 46.0 %   MCV 92.7 78.0 - 100.0 fL   MCH 30.1 26.0 - 34.0 pg   MCHC 32.5 30.0 - 36.0 g/dL   RDW 16.4 (H) 11.5 - 15.5 %   Platelets 277 150 - 400 K/uL  Blood gas, arterial     Status: Abnormal   Collection Time: 01/28/2016  7:45 PM  Result Value Ref Range   FIO2 1.00    Delivery systems VENTILATOR    Mode PRESSURE REGULATED VOLUME CONTROL    VT 380 mL   LHR 12 resp/min   Peep/cpap 10.0 cm H20   pH, Arterial 7.313 (L) 7.350 - 7.450   pCO2 arterial 39.6 35.0 - 45.0 mmHg   pO2, Arterial 65.1 (L) 80.0 - 100.0 mmHg   Bicarbonate 19.6 (L) 20.0 - 24.0 mEq/L   TCO2 17.8 0 - 100 mmol/L   Acid-base deficit 5.9 (H) 0.0 - 2.0 mmol/L   O2 Saturation 91.3 %   Patient temperature 97.6    Collection site ARTERIAL LINE    Drawn by [id number]    Sample type ARTERIAL DRAW   Glucose, capillary     Status: None   Collection Time: 01/26/2016  8:04 PM  Result Value Ref Range   Glucose-Capillary 85 65 - 99 mg/dL  Troponin I     Status: Abnormal   Collection Time: 01/12/2016  8:53 PM  Result Value Ref Range   Troponin I 1.30 (HH) <0.031 ng/mL  CBC     Status: Abnormal   Collection Time:  02/07/2016 10:43 PM  Result Value Ref Range   WBC 20.9 (H) 4.0 - 10.5 K/uL   RBC 4.40 3.87 - 5.11 MIL/uL   Hemoglobin 13.0 12.0 - 15.0 g/dL   HCT 41.0 36.0 - 46.0 %   MCV 93.2 78.0 - 100.0 fL   MCH 29.5 26.0 - 34.0 pg   MCHC 31.7 30.0 - 36.0 g/dL   RDW 16.6 (H) 11.5 - 15.5 %   Platelets 167 150 - 400 K/uL  Glucose, capillary     Status: Abnormal   Collection Time: 02/05/16 12:08 AM   Result Value Ref Range   Glucose-Capillary 129 (H) 65 - 99 mg/dL  Troponin I     Status: Abnormal   Collection Time: 02/05/16  4:10 AM  Result Value Ref Range   Troponin I 0.99 (HH) <0.031 ng/mL  CBC with Differential/Platelet     Status: Abnormal   Collection Time: 02/05/16  4:10 AM  Result Value Ref Range   WBC 15.3 (H) 4.0 - 10.5 K/uL   RBC 4.16 3.87 - 5.11 MIL/uL   Hemoglobin 12.5 12.0 - 15.0 g/dL   HCT 38.9 36.0 - 46.0 %   MCV 93.5 78.0 - 100.0 fL   MCH 30.0 26.0 - 34.0 pg   MCHC 32.1 30.0 - 36.0 g/dL   RDW 16.8 (H) 11.5 - 15.5 %   Platelets 126 (L) 150 - 400 K/uL   Neutrophils Relative % 88 %   Neutro Abs 13.4 (H) 1.7 - 7.7 K/uL   Lymphocytes Relative 6 %   Lymphs Abs 1.0 0.7 - 4.0 K/uL   Monocytes Relative 6 %   Monocytes Absolute 0.9 0.1 - 1.0 K/uL   Eosinophils Relative 0 %   Eosinophils Absolute 0.0 0.0 - 0.7 K/uL   Basophils Relative 0 %   Basophils Absolute 0.0 0.0 - 0.1 K/uL  Basic metabolic panel     Status: Abnormal   Collection Time: 02/05/16  4:10 AM  Result Value Ref Range   Sodium 145 135 - 145 mmol/L   Potassium 4.7 3.5 - 5.1 mmol/L   Chloride 119 (H) 101 - 111 mmol/L   CO2 19 (L) 22 - 32 mmol/L   Glucose, Bld 148 (H) 65 - 99 mg/dL   BUN 23 (H) 6 - 20 mg/dL   Creatinine, Ser 0.91 0.44 - 1.00 mg/dL   Calcium 7.8 (L) 8.9 - 10.3 mg/dL   GFR calc non Af Amer 56 (L) >60 mL/min   GFR calc Af Amer >60 >60 mL/min   Anion gap 7 5 - 15  Phosphorus     Status: None   Collection Time: 02/05/16  4:10 AM  Result Value Ref Range   Phosphorus 3.3 2.5 - 4.6 mg/dL  Magnesium     Status: None   Collection Time: 02/05/16  4:10 AM  Result Value Ref Range   Magnesium 1.7 1.7 - 2.4 mg/dL  Lactic acid, plasma     Status: None   Collection Time: 02/05/16  4:10 AM  Result Value Ref Range   Lactic Acid, Venous 1.2 0.5 - 2.0 mmol/L  Protime-INR     Status: Abnormal   Collection Time: 02/05/16  4:10 AM  Result Value Ref Range   Prothrombin Time 15.6 (H) 11.6 - 15.2  seconds   INR 1.28 0.00 - 1.49  Hepatic function panel     Status: Abnormal   Collection Time: 02/05/16  4:10 AM  Result Value Ref Range   Total Protein 5.1 (L) 6.5 -  8.1 g/dL   Albumin 2.8 (L) 3.5 - 5.0 g/dL   AST 22 15 - 41 U/L   ALT 13 (L) 14 - 54 U/L   Alkaline Phosphatase 91 38 - 126 U/L   Total Bilirubin 1.0 0.3 - 1.2 mg/dL   Bilirubin, Direct 0.1 0.1 - 0.5 mg/dL   Indirect Bilirubin 0.9 0.3 - 0.9 mg/dL  Glucose, capillary     Status: Abnormal   Collection Time: 02/05/16  4:14 AM  Result Value Ref Range   Glucose-Capillary 138 (H) 65 - 99 mg/dL   Comment 1 Document in Chart   Blood gas, arterial     Status: Abnormal   Collection Time: 02/05/16  4:18 AM  Result Value Ref Range   FIO2 0.80    Delivery systems VENTILATOR    Mode PRESSURE REGULATED VOLUME CONTROL    VT 380 mL   LHR 12 resp/min   Peep/cpap 10.0 cm H20   pH, Arterial 7.313 (L) 7.350 - 7.450   pCO2 arterial 37.5 35.0 - 45.0 mmHg   pO2, Arterial 77.1 (L) 80.0 - 100.0 mmHg   Bicarbonate 18.5 (L) 20.0 - 24.0 mEq/L   TCO2 17.0 0 - 100 mmol/L   Acid-base deficit 6.7 (H) 0.0 - 2.0 mmol/L   O2 Saturation 94.0 %   Patient temperature 98.6    Collection site ARTERIAL LINE    Drawn by [id number]    Sample type ARTERIAL DRAW   CBC     Status: Abnormal   Collection Time: 02/05/16  8:15 AM  Result Value Ref Range   WBC 14.4 (H) 4.0 - 10.5 K/uL   RBC 4.04 3.87 - 5.11 MIL/uL   Hemoglobin 12.1 12.0 - 15.0 g/dL   HCT 37.9 36.0 - 46.0 %   MCV 93.8 78.0 - 100.0 fL   MCH 30.0 26.0 - 34.0 pg   MCHC 31.9 30.0 - 36.0 g/dL   RDW 16.8 (H) 11.5 - 15.5 %   Platelets 134 (L) 150 - 400 K/uL  Glucose, capillary     Status: Abnormal   Collection Time: 02/05/16  8:18 AM  Result Value Ref Range   Glucose-Capillary 125 (H) 65 - 99 mg/dL    Studies/Results: Dg Chest Port 1 View  02/05/2016  CLINICAL DATA:  Endotracheal tube.  Acute respiratory failure EXAM: PORTABLE CHEST 1 VIEW COMPARISON:  02/07/2016 FINDINGS: Endotracheal tube,  right central line, and [place] tube remain in place, unchanged. Moderate right pleural effusion again noted with diffuse right lung airspace disease and left perihilar airspace opacity. Heart is borderline in size. IMPRESSION: No significant change since prior study. Electronically Signed   By: Rolm Baptise M.D.   On: 02/05/2016 07:04   Dg Chest Port 1 View  01/21/2016  CLINICAL DATA:  Hematemesis EXAM: PORTABLE CHEST 1 VIEW COMPARISON:  February 04, 2016 FINDINGS: A [place] tube is again identified with the distal tip of the stomach. Lucency around the distal tube suggests inflation. No pneumothorax. The right central line and ET tube are in good position. New left perihilar opacity is seen. Layering effusion on the right has increased in the interval as has underlying opacity, most prominent in the right perihilar region. IMPRESSION: Increasing right pleural effusion and new bilateral perihilar opacities. Support apparatus is in good position. Electronically Signed   By: Dorise Bullion III M.D   On: 01/18/2016 19:38   Dg Chest Port 1 View  01/12/2016  CLINICAL DATA:  Evaluate ET tube and central line EXAM: PORTABLE CHEST 1  VIEW COMPARISON:  February 04, 2016 FINDINGS: The ET tube is in good position. A large bore OG or NG tube terminates in the left upper quadrant. A new right central line terminates in the SVC. No pneumothorax. Haziness over the right hemi thorax is consistent with layering effusion and underlying atelectasis. A calcified nodule is seen in the left lung. No other interval change IMPRESSION: Layering effusion on the right. Appropriate placement of support apparatus. No pneumothorax after line placement. Electronically Signed   By: Dorise Bullion III M.D   On: 01/30/2016 13:28   Dg Chest Port 1 View  01/12/2016  CLINICAL DATA:  82 year old female with shortness of breath and acute respiratory failure. EXAM: PORTABLE CHEST 1 VIEW COMPARISON:  02/01/2016 and prior exams. FINDINGS:  This is a mildly low volume film. Cardiomegaly is noted with new mild pulmonary vascular congestion. Bibasilar opacities and probable effusions present. There is no evidence pneumothorax. No other significant changes identified. IMPRESSION: Cardiomegaly with mild pulmonary vascular congestion. Continued bibasilar atelectasis/ opacities and probable effusions. Electronically Signed   By: Margarette Canada M.D.   On: 02/03/2016 08:19   Dg Abd Portable 1v  01/17/2016  CLINICAL DATA:  GI bleed. [place] tube placement for gastric bleeding EXAM: PORTABLE ABDOMEN - 1 VIEW COMPARISON:  None. FINDINGS: Enteric tube is in place, coiling in the proximal stomach. Nonobstructive bowel gas pattern. Bilateral lower lobe airspace opacities noted. IMPRESSION: Enteric tube coiling within the proximal stomach. Electronically Signed   By: Rolm Baptise M.D.   On: 01/19/2016 12:21      Assessment: 1. 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 Will decreased traction by half (exchange 1 L for 500 mL gravity traction) 2. Continue octreotide 3. Long discussion with husband and family about multiple issues of care specifically T IPS. There are still considering whether to pursue it either preemptively or in case of recurrent bleeding after [place] tube removed. Her husband is the final Media planner. They would like to speak to interventional radiology and do not wish to make any changes today.    Gailene Youkhana C 02/05/2016, 10:36 AM  Pager [phone] If no answer or after 5 PM call [phone]

## 2016-02-05 NOTE — Progress Notes (Signed)
CRITICAL VALUE ALERT  Critical value received:  Troponin 1.30   Date of notification:  02/05/16  Time of notification:  0142   Critical value read back:Yes.    Nurse who received alert:  Jari Favre RN  MD notified (1st page):  Dr Johnette Abraham. [place]  Time of first page:  0144  MD notified (2nd page):  Time of second page:  Responding MD:  Dr. Johnette Abraham [place]  Time MD responded:  [phone], no new order received

## 2016-02-05 NOTE — Plan of Care (Signed)
Problem: Phase I Progression Outcomes Goal: HOB elevated 30 degrees Outcome: Not Met (add Reason) HOB slowly increasing limited due to [facility] Tube

## 2016-02-05 NOTE — Progress Notes (Signed)
PULMONARY / CRITICAL CARE MEDICINE   Name: Tiffany [name] MRN: [id number] DOB: [birth year]    ADMISSION DATE:  02/03/2016 CONSULTATION DATE:  01/27/16  REFERRING MD:  Dr. Amedeo Plenty  CHIEF COMPLAINT:  GIB, Intubated for Airway Protection  HISTORY OF PRESENT ILLNESS:  82 year old female with a past medical history of hypertension, chronic systolic heart failure ef 45%  In march 2016, atrial fibrillation, arthritis, dementia (on Namenda), seasonal allergies, Raynaud's disease, GERD, prior GIB in 02/2015 thought to be related to a Dieulafoy lesion and persistent anemia s/p colonoscopy / capsule endoscopy that were unrevealing for anemia source. She presented to [facility] on 2/17 after a near syncopal episode and vomiting up blood.  Subsequent course as follows   CULTURES:     ANTIBIOTICS: Vanco 2/17 >> off Zosyn 2/17 >> off   LINES/TUBES: ETT 2/17 >> 2/19,  R IJ TLC 2/17 >> out ............. ETT 2/25 (rpeat admit ) >>  CVL by OR 2/25 >> Aline byu OR 2/25 >    SIGNIFICANT EVENTS and studies 2/17  Admit with hematemesis  2/17 EGD >> ? esophageal varices, multiple bands -> intubated, needed 2U PRBC 2/21 - CT abd w/ large right effusion and Cirrhosis without mass.   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 Rx inderal   01/29/2016 - Readmitted - OR for endoscopy with Dr Amedeo Plenty and then to ICU and PCCM consulted. PEr report from various sources - failed banding and [place] tube placed and with reported success at 2nd attempt. Never dropped bp. Currently s/p rocuronium last 60 min agao   SUBJECTIVE/OVERNIGHT/INTERVAL HX 02/05/16 - TIPS held off due to hx of s-chf a year ago.Repeat echopending. No active bleeding. On [place] tube and tension lowered by GI. On octreotide and protonix  gtt. On fent gtt with RASS -2 and follows commands. Ur OP dropping; home diuretics on hold. Normotensive   .   VITAL SIGNS: BP 94/48 mmHg  Pulse 73  Temp(Src) 97.9 F (36.6 C) (Oral)  Resp 19  Ht 5\' 1"  (1.549 m)  Wt 69.7 kg (153 lb 10.6 oz)  BMI 29.05 kg/m2  SpO2 95%   INTAKE / OUTPUT: I/O last 3 completed shifts: In: 4022.4 [I.V.:3272.4; Blood:700; IV Piggyback:50] Out: 850 [Urine:350; Blood:500]  PHYSICAL EXAMINATION: General:  Elderly female in NAD . Intubated. Loosk critically ill Neuro:  RASS -2 on fent gtt. Follows some commands. Moves all 4s HEENT: Has [place] tube to traction . Intubated Cardiovascular:  s1s2 rrr, no m/r/g Lungs:  Synch with vent. CTA biltarellay Abdomen:  Soft, non-distented Musculoskeletal:  No acute deformities  Skin:  Warm/dry, no edema   LABS:  PULMONARY  Recent Labs Lab 02/07/2016 1330 01/27/2016 1945 02/05/16 0418  PHART 7.274* 7.313* 7.313*  PCO2ART 46.9* 39.6 37.5  PO2ART 135* 65.1* 77.1*  HCO3 21.0 19.6* 18.5*  TCO2 19.4 17.8 17.0  O2SAT 98.1 91.3 94.0    CBC  Recent Labs Lab 01/26/2016 2243 02/05/16 0410 02/05/16 0815  HGB 13.0 12.5 12.1  HCT 41.0 38.9 37.9  WBC 20.9* 15.3* 14.4*  PLT 167 126* 134*    COAGULATION  Recent Labs Lab 01/24/2016 0458 02/07/2016 1420 02/05/16 0410  INR 1.37 1.27 1.28    CARDIAC    Recent Labs Lab 01/13/2016 1420 01/25/2016 2053 02/05/16 0410  TROPONINI 0.10* 1.30*  0.99*   No results for input(s): PROBNP in the last 168 hours.   CHEMISTRY  Recent Labs Lab 02/01/16 0419 02/02/16 0405 01/13/2016 0157 01/30/2016 0458 02/05/16 0410  NA 145 147* 149* 145 145  K 3.3* 3.9 4.0 4.3 4.7  CL 112* 115* 117* 115* 119*  CO2 24 24 21* 21* 19*  GLUCOSE 122* 101* 159* 142* 148*  BUN 16 18 23* 25* 23*  CREATININE 0.92 0.97 1.13* 0.97 0.91  CALCIUM 8.4* 8.4* 8.9 8.0* 7.8*  MG  --   --   --   --  1.7  PHOS  --   --   --   --  3.3   Estimated Creatinine Clearance: 41.1 mL/min (by C-G  formula based on Cr of 0.91).   LIVER  Recent Labs Lab 01/31/16 0500 02/02/16 0405 01/28/2016 0157 01/19/2016 0458 01/19/2016 1420 02/05/16 0410  AST 34  --  22 18 22 22   ALT 23  --  16 14 15  13*  ALKPHOS 83  --  115 101 105 91  BILITOT 0.7  --  0.6 0.6 0.9 1.0  PROT 5.5* 5.4* 5.9* 5.1* 6.1* 5.1*  ALBUMIN 3.1*  --  3.3* 2.9* 3.3* 2.8*  INR  --   --   --  1.37 1.27 1.28     INFECTIOUS  Recent Labs Lab 01/18/2016 1420 02/05/16 0410  LATICACIDVEN 1.3 1.2     ENDOCRINE CBG (last 3)   Recent Labs  02/05/16 0008 02/05/16 0414 02/05/16 0818  GLUCAP 129* 138* 125*         IMAGING x48h  - image(s) personally visualized  -   highlighted in bold Dg Chest Port 1 View  02/05/2016  CLINICAL DATA:  Endotracheal tube.  Acute respiratory failure EXAM: PORTABLE CHEST 1 VIEW COMPARISON:  02/03/2016 FINDINGS: Endotracheal tube, right central line, and [place] tube remain in place, unchanged. Moderate right pleural effusion again noted with diffuse right lung airspace disease and left perihilar airspace opacity. Heart is borderline in size. IMPRESSION: No significant change since prior study. Electronically Signed   By: Rolm Baptise M.D.   On: 02/05/2016 07:04   Dg Chest Port 1 View  01/14/2016  CLINICAL DATA:  Hematemesis EXAM: PORTABLE CHEST 1 VIEW COMPARISON:  February 04, 2016 FINDINGS: A [place] tube is again identified with the distal tip of the stomach. Lucency around the distal tube suggests inflation. No pneumothorax. The right central line and ET tube are in good position. New left perihilar opacity is seen. Layering effusion on the right has increased in the interval as has underlying opacity, most prominent in the right perihilar region. IMPRESSION: Increasing right pleural effusion and new bilateral perihilar opacities. Support apparatus is in good position. Electronically Signed   By: Dorise Bullion III M.D   On: 01/13/2016 19:38   Dg Chest Port 1 View  01/19/2016   CLINICAL DATA:  Evaluate ET tube and central line EXAM: PORTABLE CHEST 1 VIEW COMPARISON:  February 04, 2016 FINDINGS: The ET tube is in good position. A large bore OG or NG tube terminates in the left upper quadrant. A new right central line terminates in the SVC. No pneumothorax. Haziness over the right hemi thorax is consistent with layering effusion and underlying atelectasis. A calcified nodule is seen in the left lung. No other interval change IMPRESSION: Layering effusion on the right. Appropriate placement of support apparatus. No pneumothorax after line placement. Electronically Signed   By: Dorise Bullion III M.D  On: 01/25/2016 13:28   Dg Chest Port 1 View  02/03/2016  CLINICAL DATA:  82 year old female with shortness of breath and acute respiratory failure. EXAM: PORTABLE CHEST 1 VIEW COMPARISON:  02/01/2016 and prior exams. FINDINGS: This is a mildly low volume film. Cardiomegaly is noted with new mild pulmonary vascular congestion. Bibasilar opacities and probable effusions present. There is no evidence pneumothorax. No other significant changes identified. IMPRESSION: Cardiomegaly with mild pulmonary vascular congestion. Continued bibasilar atelectasis/ opacities and probable effusions. Electronically Signed   By: Margarette Canada M.D.   On: 01/22/2016 08:19   Dg Abd Portable 1v  01/12/2016  CLINICAL DATA:  GI bleed. [place] tube placement for gastric bleeding EXAM: PORTABLE ABDOMEN - 1 VIEW COMPARISON:  None. FINDINGS: Enteric tube is in place, coiling in the proximal stomach. Nonobstructive bowel gas pattern. Bilateral lower lobe airspace opacities noted. IMPRESSION: Enteric tube coiling within the proximal stomach. Electronically Signed   By: Rolm Baptise M.D.   On: 01/25/2016 12:21    ASSESSMENT / PLAN:  PULMONARY A: #recurrent R effusion march 2016 and feb 2017 when resuscitated for Gi bleed  #current Acute resp failure - for GI Bleed, sp intubation in OR  - @ risk for recurrent  gi bleed  - recurrent hepatic hydrothorax 02/05/16  - needing 70% fio2/peep 10 P:   Full vent support Check cxr for pleural effusio -will do thora 02/06/16  CARDIOVASCULAR A:  #baseline  - chronic systolic CHF  #Now  At risk for hemorrhagic shock - currently maintaining bP At risk for MI -   - maintinaing bp. No active bleed. STress related trop leak   P:  Await echo - if EF low will need cards clearance for TIPS MAP goal > 65  RENAL A:   At risk for AKI/ATN due to volume loss and Cirrhosis   - creat holding. Mild hypomag  P:   Monitor  KVO Lasix  X 1 dose   GASTROINTESTINAL A:   #Baseline  - child A cirrhosis - new dx feb 2017 Current  - Recurrent life threatening variceal bleed - 3rd episode since march 2052m, 2nd in feb 2017    P:   Octreotide gtt + Protoxnix Gtt GI following TIPS decision based on ECHO and course - IR and GI following  HEMATOLOGIC A:   Acute blood loss anemia  P:  PRBC for hgb < 7gm% or active bleeding  INFECTIOUS A:   No evidence of infection  P:   SBP prophylaxis with ceftriaxone monitor  ENDOCRINE A:   No hx of DM   P:   ssi  NEUROLOGIC A:   #baseline Dementia - on home Namenda and Remeron - family description summer 2016 MME 16/30 but balances checks now and is mild per hx  #curernt - at risk for encephalopathy due to GIB, cirrhosis and dementia P:   Lactulose if needed - currently GI feels no need Prn sedation RASS goal: o to -2    FAMILY  - Updates: no family at bedside 01/15/2016 in ICU but GI RN said Dr Amedeo Plenty of GI did update them  - Inter-disciplinary family meet or Palliative Care meeting due by:  02/11/16 which is day 7. Currently Chaplain consult called - donew ith 99year hold husband, son in law and daughter at bedsid 01/16/16    The patient is critically ill with multiple organ systems failure and requires high complexity decision making for assessment and support, frequent evaluation and titration  of therapies, application of  advanced monitoring technologies and extensive interpretation of multiple databases.   Critical Care Time devoted to patient care services described in this note is  60  Minutes. This time reflects time of care of this signee Dr Brand Males. This critical care time does not reflect procedure time, or teaching time or supervisory time of PA/NP/Med student/Med Resident etc but could involve care discussion time    Dr. Brand Males, M.D., [facility].C.P Pulmonary and Critical Care Medicine Staff Physician [place] Pager: [phone], If no answer or between  15:00h - 7:00h: call [phone]  02/05/2016 11:11 AM

## 2016-02-05 NOTE — Addendum Note (Signed)
Addendum  created 02/05/16 [id number] by Jillyn Hidden, MD   Modules edited: Anesthesia Blocks and Procedures, Clinical Notes   Clinical Notes:  File: [id number]

## 2016-02-05 NOTE — Progress Notes (Signed)
Interventional Radiology Progress Note    82 yo female admitted with second esophageal variceal bleed in 1 week.  Currently intubated with [place] tube in place for hemorrhage control.    IR is following for TIPS as the solution to acute hemorrhage, however, the patient has a history of heart failure, with prior ECHO of 40-45% EF in March of 2016.  Currently has positive Troponin I, and a repeat ECHO is pending.  My impression is that the limiting factor for TIPS will be her cardiac function.  I have discussed this with her daughter, who understands.  Awaiting completed ECHO, although I am doubtful of her candidacy.    Will follow admission.    Call with questions/concerns.   Signed,  Dulcy Fanny. Earleen Newport, DO

## 2016-02-06 DIAGNOSIS — K922 Gastrointestinal hemorrhage, unspecified: Secondary | ICD-10-CM

## 2016-02-06 LAB — BASIC METABOLIC PANEL
ANION GAP: 8 (ref 5–15)
BUN: 29 mg/dL — ABNORMAL HIGH (ref 6–20)
CO2: 20 mmol/L — AB (ref 22–32)
Calcium: 8.1 mg/dL — ABNORMAL LOW (ref 8.9–10.3)
Chloride: 117 mmol/L — ABNORMAL HIGH (ref 101–111)
Creatinine, Ser: 1.29 mg/dL — ABNORMAL HIGH (ref 0.44–1.00)
GFR calc Af Amer: 43 mL/min — ABNORMAL LOW (ref >60)
GFR, EST NON AFRICAN AMERICAN: 37 mL/min — AB (ref >60)
GLUCOSE: 126 mg/dL — AB (ref 65–99)
POTASSIUM: 4.7 mmol/L (ref 3.5–5.1)
Sodium: 145 mmol/L (ref 135–145)

## 2016-02-06 LAB — GLUCOSE, CAPILLARY
GLUCOSE-CAPILLARY: 128 mg/dL — AB (ref 65–99)
GLUCOSE-CAPILLARY: 113 mg/dL — AB (ref 65–99)
GLUCOSE-CAPILLARY: 107 mg/dL — AB (ref 65–99)
Glucose-Capillary: 116 mg/dL — ABNORMAL HIGH (ref 65–99)
Glucose-Capillary: 108 mg/dL — ABNORMAL HIGH (ref 65–99)

## 2016-02-06 LAB — CBC WITH DIFFERENTIAL/PLATELET
BASOS ABS: 0 10*3/uL (ref 0.0–0.1)
BASOS PCT: 0 %
Eosinophils Absolute: 0.1 10*3/uL (ref 0.0–0.7)
Eosinophils Relative: 1 %
HEMATOCRIT: 34.2 % — AB (ref 36.0–46.0)
HEMOGLOBIN: 10.6 g/dL — AB (ref 12.0–15.0)
Lymphocytes Relative: 10 %
Lymphs Abs: 1.1 10*3/uL (ref 0.7–4.0)
MCH: 30 pg (ref 26.0–34.0)
MCHC: 31 g/dL (ref 30.0–36.0)
MCV: 96.9 fL (ref 78.0–100.0)
Monocytes Absolute: 1 10*3/uL (ref 0.1–1.0)
Monocytes Relative: 9 %
NEUTROS ABS: 8.4 10*3/uL — AB (ref 1.7–7.7)
NEUTROS PCT: 80 %
Platelets: 125 10*3/uL — ABNORMAL LOW (ref 150–400)
RBC: 3.53 MIL/uL — AB (ref 3.87–5.11)
RDW: 16.8 % — AB (ref 11.5–15.5)
WBC: 10.6 10*3/uL — AB (ref 4.0–10.5)

## 2016-02-06 LAB — PH, BODY FLUID: pH, Body Fluid: 8.1

## 2016-02-06 LAB — PHOSPHORUS: Phosphorus: 3.8 mg/dL (ref 2.5–4.6)

## 2016-02-06 LAB — MAGNESIUM: MAGNESIUM: 2.3 mg/dL (ref 1.7–2.4)

## 2016-02-06 MED ORDER — SODIUM CHLORIDE 0.9 % IV SOLN
1.0000 g | Freq: Once | INTRAVENOUS | Status: AC
  Administered 2016-02-06: 1 g via INTRAVENOUS
  Filled 2016-02-06: qty 10

## 2016-02-06 NOTE — Progress Notes (Signed)
[facility] physician informed of patient heart rate and rhythm change. Patient has been in and out of afib throughout the day. Pt in afib/aflutter with heart rate 120-160s over past 15 minutes, systolic blood pressure [id number]. Patient appears comfortable on ventilator, with no signs of distress. 1mg  of prn metoprolol given per order, no change in HR/Rhythm. Will continue to monitor.

## 2016-02-06 NOTE — Progress Notes (Signed)
[facility] PROGRESS NOTE Subjective patient remains intubated with [place] tube to traction. Heavily sedated. No gross bleeding. Has been seen by interventional radiology. Question if she will be a candidate for TIPS procedure. Further workup is underway and they are supposed to see her again today. The husband and son in law or at the bedside today  Objective: Vital signs in last 24 hours: Temp:  [97.5 F (36.4 C)-99.1 F (37.3 C)] 99.1 F (37.3 C) (02/27 0800) Pulse Rate:  [65-113] 74 (02/27 0900) Resp:  [8-20] 10 (02/27 0900) BP: (101-122)/(47-61) 104/49 mmHg (02/27 0723) SpO2:  [93 %-97 %] 96 % (02/27 0900) Arterial Line BP: (93-126)/(44-65) 102/44 mmHg (02/27 0900) FiO2 (%):  [40 %-70 %] 40 % (02/27 0723) Weight:  [72.4 kg (159 lb 9.8 oz)] 72.4 kg (159 lb 9.8 oz) (02/27 0500)    Intake/Output from previous day: 02/26 0701 - 02/27 0700 In: 2320.9 [I.V.:2110.9; IV Piggyback:210] Out: 255 [Urine:255] Intake/Output this shift: Total I/O In: 161.9 [I.V.:161.9] Out: 45 [Urine:45]  PE: General-- heavily sedated with [place] tube detraction and intubate Heart-- Lungs-- grossly clear Abdomen-- Q bowel sounds  Lab Results:  Recent Labs  01/29/2016 1830 01/28/2016 2243 02/05/16 0410 02/05/16 0815 02/06/16 0555  WBC 26.0* 20.9* 15.3* 14.4* 10.6*  HGB 13.6 13.0 12.5 12.1 10.6*  HCT 41.9 41.0 38.9 37.9 34.2*  PLT 277 167 126* 134* 125*   BMET  Recent Labs  01/28/2016 0157 01/20/2016 0458 02/05/16 0410 02/06/16 0439  NA 149* 145 145 145  K 4.0 4.3 4.7 4.7  CL 117* 115* 119* 117*  CO2 21* 21* 19* 20*  CREATININE 1.13* 0.97 0.91 1.29*   LFT  Recent Labs  01/29/2016 0458 01/30/2016 1420 02/05/16 0410  PROT 5.1* 6.1* 5.1*  AST 18 22 22   ALT 14 15 13*  ALKPHOS 101 105 91  BILITOT 0.6 0.9 1.0  BILIDIR  --  0.2 0.1  IBILI  --  0.7 0.9   PT/INR  Recent Labs  01/30/2016 0458 01/31/2016 1420 02/05/16 0410  LABPROT 16.5* 15.6* 15.6*  INR 1.37 1.27 1.28    PANCREAS No results for input(s): LIPASE in the last 72 hours.       Studies/Results: Dg Chest Port 1 View  02/05/2016  CLINICAL DATA:  Endotracheal tube.  Acute respiratory failure EXAM: PORTABLE CHEST 1 VIEW COMPARISON:  02/05/2016 FINDINGS: Endotracheal tube, right central line, and [place] tube remain in place, unchanged. Moderate right pleural effusion again noted with diffuse right lung airspace disease and left perihilar airspace opacity. Heart is borderline in size. IMPRESSION: No significant change since prior study. Electronically Signed   By: Rolm Baptise M.D.   On: 02/05/2016 07:04   Dg Chest Port 1 View  01/27/2016  CLINICAL DATA:  Hematemesis EXAM: PORTABLE CHEST 1 VIEW COMPARISON:  February 04, 2016 FINDINGS: A [place] tube is again identified with the distal tip of the stomach. Lucency around the distal tube suggests inflation. No pneumothorax. The right central line and ET tube are in good position. New left perihilar opacity is seen. Layering effusion on the right has increased in the interval as has underlying opacity, most prominent in the right perihilar region. IMPRESSION: Increasing right pleural effusion and new bilateral perihilar opacities. Support apparatus is in good position. Electronically Signed   By: Dorise Bullion III M.D   On: 02/02/2016 19:38   Dg Chest Port 1 View  01/24/2016  CLINICAL DATA:  Evaluate ET tube and central line EXAM: PORTABLE CHEST 1 VIEW  COMPARISON:  February 04, 2016 FINDINGS: The ET tube is in good position. A large bore OG or NG tube terminates in the left upper quadrant. A new right central line terminates in the SVC. No pneumothorax. Haziness over the right hemi thorax is consistent with layering effusion and underlying atelectasis. A calcified nodule is seen in the left lung. No other interval change IMPRESSION: Layering effusion on the right. Appropriate placement of support apparatus. No pneumothorax after line placement.  Electronically Signed   By: Dorise Bullion III M.D   On: 01/27/2016 13:28   Dg Abd Portable 1v  01/17/2016  CLINICAL DATA:  GI bleed. [place] tube placement for gastric bleeding EXAM: PORTABLE ABDOMEN - 1 VIEW COMPARISON:  None. FINDINGS: Enteric tube is in place, coiling in the proximal stomach. Nonobstructive bowel gas pattern. Bilateral lower lobe airspace opacities noted. IMPRESSION: Enteric tube coiling within the proximal stomach. Electronically Signed   By: Rolm Baptise M.D.   On: 01/21/2016 12:21    Medications: I have reviewed the patient's current medications.  Assessment/Plan: 1. Variceal bleeding. Patient has had to variceal bleeds in the past several weeks with multiple bands placed and recently failed to respond to banding and somatostatin infusion. A [place] tube is in place. Her risk of hemorrhage is very high if the [place] tube is deflated. Long discussion with the patient's husband and son in law. Interventional radiology is to see again about whether or not a TIPS procedure would be appropriate. I would also suggest a palliative care consult since I feel that her risk of dying is quite high if the [place] tube is deflated and probably even with placement of the TIPS procedure. We will follow. We continue on somatostatin and leave [place] in place for now. Sulay Brymer JR,Tawanda Schall L 02/06/2016, 10:24 AM  This note was created using voice recognition software. Minor errors may Have occurred unintentionally.  Pager: [phone] If no answer or after hours call [phone]

## 2016-02-06 NOTE — Progress Notes (Signed)
PULMONARY / CRITICAL CARE MEDICINE   Name: Tiffany [name] MRN: [id number] DOB: 1929-04-24    ADMISSION DATE:  02/01/2016 CONSULTATION DATE:  01/27/16  REFERRING MD:  Dr. Amedeo Plenty  CHIEF COMPLAINT:  GIB, Intubated for Airway Protection  HISTORY OF PRESENT ILLNESS:  82 year old female with a past medical history of hypertension, chronic systolic heart failure ef 45%  In march 2016, atrial fibrillation, arthritis, dementia (on Namenda), seasonal allergies, Raynaud's disease, GERD, prior GIB in 02/2015 thought to be related to a Dieulafoy lesion and persistent anemia s/p colonoscopy / capsule endoscopy that were unrevealing for anemia source. She presented to [facility] on 2/17 after a near syncopal episode and vomiting up blood.  Subsequent course as follows   CULTURES:   ANTIBIOTICS: Vanco 2/17 >> off Zosyn 2/17 >> off   LINES/TUBES: ETT 2/17 >> 2/19,  R IJ TLC 2/17 >> out ............. ETT 2/25 (rpeat admit ) >>  CVL by OR 2/25 >> Aline byu OR 2/25 >    SIGNIFICANT EVENTS and studies 2/17  Admit with hematemesis  2/17 EGD >> ? esophageal varices, multiple bands -> intubated, needed 2U PRBC 2/21 - CT abd w/ large right effusion and Cirrhosis without mass.   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 Rx inderal   02/03/2016 - Readmitted - OR for endoscopy with Dr Amedeo Plenty and then to ICU and PCCM consulted. PEr report from various sources - failed banding and [place] tube placed and with reported success at 2nd attempt. Never dropped bp. Currently s/p rocuronium last 60 min agao   SUBJECTIVE/OVERNIGHT/INTERVAL HX Sedated on vent   VITAL SIGNS: BP 104/49 mmHg  Pulse 74  Temp(Src) 99.1 F (37.3 C) (Oral)  Resp 10  Ht 5\' 1"  (1.549 m)  Wt 159 lb 9.8 oz (72.4 kg)  BMI  30.17 kg/m2  SpO2 96%   INTAKE / OUTPUT: I/O last 3 completed shifts: In: 3261.4 [I.V.:3001.4; IV Piggyback:260] Out: 385 [Urine:385]  PHYSICAL EXAMINATION: General:  Elderly female in NAD . Intubated. still critically ill Neuro:  RASS -2 on fent gtt. Follows some commands. Moves all 4s HEENT: Has [place] tube to traction . Intubated Cardiovascular:  s1s2 rrr, no m/r/g Lungs:  Synch with vent. CTA bilaterally; but much decreased on right  Abdomen:  Soft, non-distented Musculoskeletal:  No acute deformities  Skin:  Warm/dry, no edema   LABS:  PULMONARY  Recent Labs Lab 02/03/2016 1330 01/23/2016 1945 02/05/16 0418  PHART 7.274* 7.313* 7.313*  PCO2ART 46.9* 39.6 37.5  PO2ART 135* 65.1* 77.1*  HCO3 21.0 19.6* 18.5*  TCO2 19.4 17.8 17.0  O2SAT 98.1 91.3 94.0    CBC  Recent Labs Lab 02/05/16 0410 02/05/16 0815 02/06/16 0555  HGB 12.5 12.1 10.6*  HCT 38.9 37.9 34.2*  WBC 15.3* 14.4* 10.6*  PLT 126* 134* 125*    COAGULATION  Recent Labs Lab 02/05/2016 0458 01/23/2016 1420 02/05/16 0410  INR 1.37 1.27 1.28    CARDIAC    Recent Labs Lab 02/07/2016 1420 01/27/2016 2053 02/05/16 0410  TROPONINI 0.10* 1.30* 0.99*   No results for input(s): PROBNP in the last 168 hours.   CHEMISTRY  Recent Labs Lab 02/02/16 0405 01/29/2016 0157 01/19/2016 0458 02/05/16 0410 02/06/16 0439  NA 147* 149* 145 145 145  K 3.9 4.0 4.3 4.7 4.7  CL 115* 117* 115* 119* 117*  CO2 24 21* 21* 19* 20*  GLUCOSE 101* 159* 142* 148* 126*  BUN 18 23* 25* 23* 29*  CREATININE 0.97 1.13* 0.97 0.91 1.29*  CALCIUM 8.4* 8.9 8.0* 7.8* 8.1*  MG  --   --   --  1.7 2.3  PHOS  --   --   --  3.3 3.8   Estimated Creatinine Clearance: 29.5 mL/min (by C-G formula based on Cr of 1.29).   LIVER  Recent Labs Lab 01/31/16 0500 02/02/16 0405 01/22/2016 0157 01/21/2016 0458 01/18/2016 1420 02/05/16 0410  AST 34  --  22 18 22 22   ALT 23  --  16 14 15  13*  ALKPHOS 83  --  115 101 105 91  BILITOT 0.7   --  0.6 0.6 0.9 1.0  PROT 5.5* 5.4* 5.9* 5.1* 6.1* 5.1*  ALBUMIN 3.1*  --  3.3* 2.9* 3.3* 2.8*  INR  --   --   --  1.37 1.27 1.28     INFECTIOUS  Recent Labs Lab 01/24/2016 1420 02/05/16 0410  LATICACIDVEN 1.3 1.2     ENDOCRINE CBG (last 3)   Recent Labs  02/05/16 2330 02/06/16 0404 02/06/16 0756  GLUCAP 128* 116* 128*    IMAGING x48h  - image(s) personally visualized  -   highlighted in bold Dg Chest Port 1 View  02/05/2016  CLINICAL DATA:  Endotracheal tube.  Acute respiratory failure EXAM: PORTABLE CHEST 1 VIEW COMPARISON:  02/06/2016 FINDINGS: Endotracheal tube, right central line, and [place] tube remain in place, unchanged. Moderate right pleural effusion again noted with diffuse right lung airspace disease and left perihilar airspace opacity. Heart is borderline in size. IMPRESSION: No significant change since prior study. Electronically Signed   By: Rolm Baptise M.D.   On: 02/05/2016 07:04   Dg Chest Port 1 View  02/02/2016  CLINICAL DATA:  Hematemesis EXAM: PORTABLE CHEST 1 VIEW COMPARISON:  February 04, 2016 FINDINGS: A [place] tube is again identified with the distal tip of the stomach. Lucency around the distal tube suggests inflation. No pneumothorax. The right central line and ET tube are in good position. New left perihilar opacity is seen. Layering effusion on the right has increased in the interval as has underlying opacity, most prominent in the right perihilar region. IMPRESSION: Increasing right pleural effusion and new bilateral perihilar opacities. Support apparatus is in good position. Electronically Signed   By: Dorise Bullion III M.D   On: 01/19/2016 19:38   Dg Chest Port 1 View  01/15/2016  CLINICAL DATA:  Evaluate ET tube and central line EXAM: PORTABLE CHEST 1 VIEW COMPARISON:  February 04, 2016 FINDINGS: The ET tube is in good position. A large bore OG or NG tube terminates in the left upper quadrant. A new right central line terminates in the SVC.  No pneumothorax. Haziness over the right hemi thorax is consistent with layering effusion and underlying atelectasis. A calcified nodule is seen in the left lung. No other interval change IMPRESSION: Layering effusion on the right. Appropriate placement of support apparatus. No pneumothorax after line placement. Electronically Signed   By: Dorise Bullion III M.D   On: 01/11/2016 13:28   Dg Abd Portable 1v  02/05/2016  CLINICAL DATA:  GI bleed. [place] tube placement for gastric bleeding EXAM: PORTABLE ABDOMEN - 1 VIEW COMPARISON:  None. FINDINGS: Enteric tube is in place, coiling in the proximal stomach. Nonobstructive bowel gas pattern. Bilateral lower lobe airspace opacities noted. IMPRESSION:  Enteric tube coiling within the proximal stomach. Electronically Signed   By: Rolm Baptise M.D.   On: 01/17/2016 12:21   Complete opacification of the right hemithorax    ASSESSMENT / PLAN:  PULMONARY A: #recurrent R effusion march 2016 and feb 2017 when resuscitated for Gi bleed #current Acute resp failure - for GI Bleed, s/p intubation in OR  - @ risk for recurrent gi bleed  - recurrent hepatic hydrothorax 02/05/16-->now w/ complete opacification of the right hemithorax    P:   Full vent support Will need therapeutic thoracentesis to get off vent  PAD protocol   CARDIOVASCULAR A:  #baseline  - chronic systolic CHF (lastest EF [id number])  #Now  At risk for hemorrhagic shock - currently maintaining bP At risk for MI  Demand ischemia  Afib  P:  Keep euvolemic  MAP goal > 65 Would use Neo for hypotension   RENAL A:   Mild AKI Mild hyperchloremic metabolic acidosis   P:   Monitor  KVO Hold further lasix for now Keep even volume status    GASTROINTESTINAL A:   #Baseline  - child A cirrhosis - new dx feb 2017 Current  - Recurrent life threatening variceal bleed - 3rd episode since march 2064m, 2nd in feb 2017  P:   Octreotide gtt + Protonix Gtt GI following TIPS  decision based on ECHO and course - IR and GI following  HEMATOLOGIC A:   Acute blood loss anemia  P:  PRBC for hgb < 7gm% or active bleeding  INFECTIOUS A:   No evidence of infection  P:   SBP prophylaxis with ceftriaxone monitor  ENDOCRINE A:   No hx of DM   P:   ssi  NEUROLOGIC A:   #baseline Dementia - on home Namenda and Remeron - family description summer 2016 MME 16/30 but balances checks now and is mild per hx  #current - at risk for encephalopathy due to GIB, cirrhosis and dementia P:   Lactulose if needed - currently GI feels no need-->if has tips will start Prn sedation RASS goal: o to -2    FAMILY  - Updates: no family at bedside 02/05/2016 in ICU but GI RN said Dr Amedeo Plenty of GI did update them  - Inter-disciplinary family meet or Palliative Care meeting due by:  02/11/16 which is day 7. Currently Chaplain consult called - donew ith 99year hold husband, son in law and daughter at bedsid 01/16/16  Discussion In and out of AF. BP boarderline. EF back-->45-50%. Hgb drifting but not needing xfusion yet. Right chest w/ complete opacification d/t recurrent hepato-thorax. Ideally we can go forward w/ TIPS. No good solution here as the patient is terminally ill. From a symptom stand-point would be far easier to deal w/ hepatic encephalopathy and volume overload than it would to treat bleeding out. We will cont supportive care for now.    Erick Colace ACNP-BC [place] Pager # [phone] OR # [phone] if no answer  02/06/2016 9:31 AM

## 2016-02-06 NOTE — Progress Notes (Signed)
Daughter came back and said family discussed - husband, daughter, and son in [place]  They decided against TIPS She confirmed DNR and no pressors as well   Dr. Brand Males, M.D., [facility].C.P Pulmonary and Critical Care Medicine Staff Physician [place] [facility] Pager: [phone], If no answer or between  15:00h - 7:00h: call [phone]  02/06/2016 11:39 AM

## 2016-02-06 NOTE — Progress Notes (Signed)
   02/06/16 1200  Clinical Encounter Type  Visited With Family;Patient not available  Visit Type Initial;Psychological support;Spiritual support;Critical Care  Referral From Nurse  Consult/Referral To Chaplain  Spiritual Encounters  Spiritual Needs Emotional;Grief support;Other (Comment);Prayer (Pastoral Conversation/Support)  Stress Factors  Patient Stress Factors None identified  Family Stress Factors Loss;Health changes   I visited with the patient per referral. The patient was not able to acknowledge my visit, but the patient's husband, daughter and son-in-law were in the room.  They requested prayer, which was provided.  The family said that they only want what the patient would want and are at the point to let her rest peacefully.   Will follow-up with the family.    [place] M.Div.

## 2016-02-06 NOTE — Progress Notes (Signed)
Initial Nutrition Assessment  INTERVENTION:   If patient to remain intubated and within [place], recommend nutrition support.  TF recommendations: Initiate Vital HP @ 20 ml/hr and increase by 10 ml every 4 hours to goal rate of 50 ml/hr.  Tube feeding regimen provides 1200 kcal (100% of needs), 105 grams of protein, and 1003 ml of H2O.   RD to continue to monitor for plan.  NUTRITION DIAGNOSIS:   Inadequate oral intake related to inability to eat as evidenced by NPO status.  GOAL:   Patient will meet greater than or equal to 90% of their needs  MONITOR:   Vent status, Labs, Weight trends, I & O's  REASON FOR ASSESSMENT:   Ventilator    ASSESSMENT:   82 y.o. female with PMH of nonalcoholic liver cirrhosis, hypertension, chronic combined systolic and diastolic heart failure with EF 40-45 percent, atrial fibrillation, arthritis, dementia, seasonal allergies, Raynaud's disease, GERD, recent upper GIB from esophageal varices (s/p banding), who presents with hematemesis.  Patient's family not wanting aggressive care and pt is now DNR. Pt remains intubated. Palliative to see patient. Left TF recommendations, if needed.  Patient is currently intubated on ventilator support MV: 5.6 L/min Temp (24hrs), Avg:98.2 F (36.8 C), Min:97.5 F (36.4 C), Max:99.1 F (37.3 C)  Medications reviewed. Labs reviewed: Mg/Phos WNL  Diet Order:  Diet NPO time specified  Skin:  Reviewed, no issues  Last BM:  PTA  Height:   Ht Readings from Last 1 Encounters:  01/30/2016 5\' 1"  (1.549 m)    Weight:   Wt Readings from Last 1 Encounters:  02/06/16 159 lb 9.8 oz (72.4 kg)    Ideal Body Weight:  47.7 kg  BMI:  Body mass index is 30.17 kg/(m^2).  Estimated Nutritional Needs:   Kcal:  1200-1400  Protein:  95-105g  Fluid:  1.4L/day  EDUCATION NEEDS:   No education needs identified at this time  Clayton Bibles, MS, RD, LDN Pager: [phone] After Hours Pager: [phone]

## 2016-02-06 NOTE — Addendum Note (Signed)
Addendum  created 02/06/16 [id number] by Lollie Sails, CRNA   Modules edited: Charges VN

## 2016-02-06 NOTE — Progress Notes (Signed)
Patient ID: Tiffany [name], female   DOB: Aug 08, 1929, 82 y.o.   MRN: [id number] The patient si on the vent but awake and responds to questions. She is HD stable without pressors. Bl[place]ore tube is in place. Hb stable at 10. Her total bili is normal. She is Ardine Eng A liver disease. Her last troponin was .99. Cardiac ischemia would make the TIPS risky, but otherwise, she is a candidate. She and the family wish to hold off on TIPS. GI to deflate balloon tomorrow and observe.

## 2016-02-06 NOTE — Progress Notes (Addendum)
Long discussion  Goals of care. Pt's family is realistic. We are now awaiting IR in-put re: TIPS.   Best case scenario: TIPS-->thora-->wean extubate. Treat symptomatically that point w/ rx including diuretics as well as lactulose. Think heart failure, renal failure and encephalopathy would be much easier to treat and less traumatic to family during the dying process than exsanguination.   Plan Make her DNR Await IR   Erick Colace ACNP-BC [place] Pager # [phone] OR # [phone] if no answer

## 2016-02-06 NOTE — Progress Notes (Signed)
[place] Progress Note Patient Name: MAKEYLA DESRAVINES DOB: 1929-08-04 MRN: [id number]   Date of Service  02/06/2016  HPI/Events of Note  RN called for episoides of tachycardia  eICU Interventions  Pt known to have afib. In and out of afib 90-120s, BP stable. 96% on 40%. Comfortable. Ca was low -- replace ca. Mg and K and Phos were N.  Told nurse to call back if HR becomes an issue     Intervention Category Intermediate Interventions: Electrolyte abnormality - evaluation and management  [place] 02/06/2016, 6:18 AM

## 2016-02-06 NOTE — Progress Notes (Signed)
Long discussion with husband and daughter,Drs taking care of her. TIPS and aggressive care not desired. MIn tube has been with traction for 48 hrs. I removed traction but left balloon inflated for now. Will deflate tomorrow unless family changes their mind. Should be ok to leave tube in place for a while if balloons deflated.Family agreeable to supportive care only at this point. Palliative care to see.

## 2016-02-07 ENCOUNTER — Encounter (HOSPITAL_COMMUNITY): Payer: Self-pay | Admitting: Anesthesiology

## 2016-02-07 ENCOUNTER — Other Ambulatory Visit: Payer: Self-pay | Admitting: *Deleted

## 2016-02-07 ENCOUNTER — Ambulatory Visit: Payer: Self-pay | Admitting: *Deleted

## 2016-02-07 DIAGNOSIS — Z515 Encounter for palliative care: Secondary | ICD-10-CM

## 2016-02-07 DIAGNOSIS — Z66 Do not resuscitate: Secondary | ICD-10-CM

## 2016-02-07 LAB — CBC WITH DIFFERENTIAL/PLATELET
Basophils Absolute: 0 10*3/uL (ref 0.0–0.1)
Basophils Relative: 0 %
Eosinophils Absolute: 0.1 10*3/uL (ref 0.0–0.7)
Eosinophils Relative: 1 %
HEMATOCRIT: 34.3 % — AB (ref 36.0–46.0)
HEMOGLOBIN: 10.6 g/dL — AB (ref 12.0–15.0)
LYMPHS ABS: 0.8 10*3/uL (ref 0.7–4.0)
Lymphocytes Relative: 8 %
MCH: 29.9 pg (ref 26.0–34.0)
MCHC: 30.9 g/dL (ref 30.0–36.0)
MCV: 96.9 fL (ref 78.0–100.0)
MONOS PCT: 9 %
Monocytes Absolute: 0.9 10*3/uL (ref 0.1–1.0)
NEUTROS ABS: 8.4 10*3/uL — AB (ref 1.7–7.7)
NEUTROS PCT: 82 %
Platelets: 121 10*3/uL — ABNORMAL LOW (ref 150–400)
RBC: 3.54 MIL/uL — ABNORMAL LOW (ref 3.87–5.11)
RDW: 16.1 % — ABNORMAL HIGH (ref 11.5–15.5)
WBC: 10.3 10*3/uL (ref 4.0–10.5)

## 2016-02-07 LAB — GLUCOSE, CAPILLARY
GLUCOSE-CAPILLARY: 106 mg/dL — AB (ref 65–99)
Glucose-Capillary: 107 mg/dL — ABNORMAL HIGH (ref 65–99)
Glucose-Capillary: 112 mg/dL — ABNORMAL HIGH (ref 65–99)

## 2016-02-07 LAB — BASIC METABOLIC PANEL
Anion gap: 6 (ref 5–15)
BUN: 29 mg/dL — ABNORMAL HIGH (ref 6–20)
CHLORIDE: 123 mmol/L — AB (ref 101–111)
CO2: 20 mmol/L — AB (ref 22–32)
CREATININE: 1.16 mg/dL — AB (ref 0.44–1.00)
Calcium: 8.2 mg/dL — ABNORMAL LOW (ref 8.9–10.3)
GFR calc non Af Amer: 42 mL/min — ABNORMAL LOW (ref >60)
GFR, EST AFRICAN AMERICAN: 49 mL/min — AB (ref >60)
Glucose, Bld: 120 mg/dL — ABNORMAL HIGH (ref 65–99)
POTASSIUM: 4.7 mmol/L (ref 3.5–5.1)
Sodium: 149 mmol/L — ABNORMAL HIGH (ref 135–145)

## 2016-02-07 LAB — MAGNESIUM: MAGNESIUM: 2.3 mg/dL (ref 1.7–2.4)

## 2016-02-07 LAB — PHOSPHORUS: Phosphorus: 3.5 mg/dL (ref 2.5–4.6)

## 2016-02-07 IMAGING — CT CT CHEST HIGH RESOLUTION W/O CM
2 of 5 series · 15 of 36 positions shown, 18 images · non-contrast
Comparison: No prior chest CT.  Chest x-ray 03/08/2015.

CLINICAL DATA: 85-year-old female with increasing shortness of
breath. Abnormal chest x-ray.

EXAM:
CT CHEST WITHOUT CONTRAST
TECHNIQUE: Multidetector CT imaging of the chest was performed following the
standard protocol without intravenous contrast. High resolution
imaging of the lungs, as well as inspiratory and expiratory imaging,
was performed.

[Series 4: chest with st · axial · 0.65mm/px · z∈[-191,+74]mm · 12 of 61 slices shown, 15 images]
[im 4/61  mediastinal]
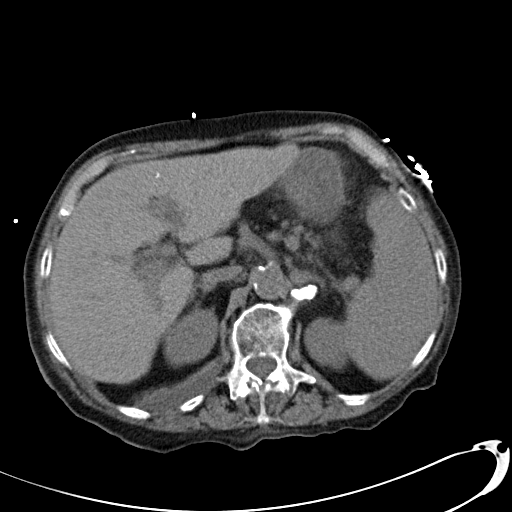
[im 4/61  lung]
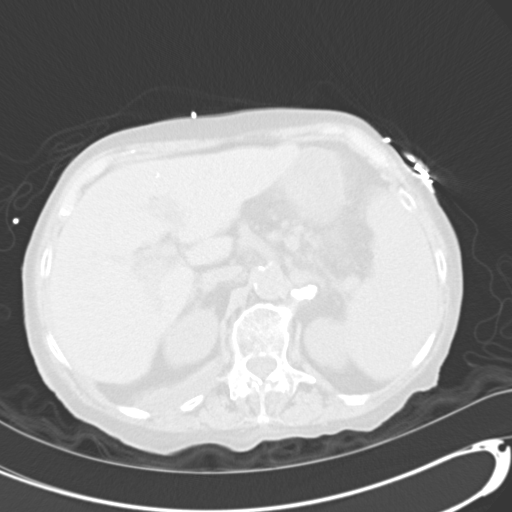
[im 11/61  lung]
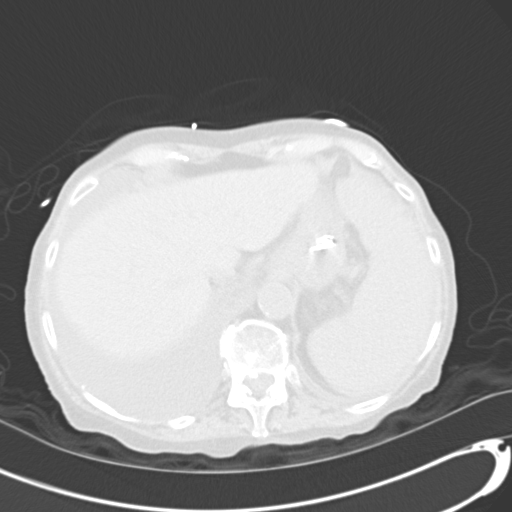
[im 15/61  lung]
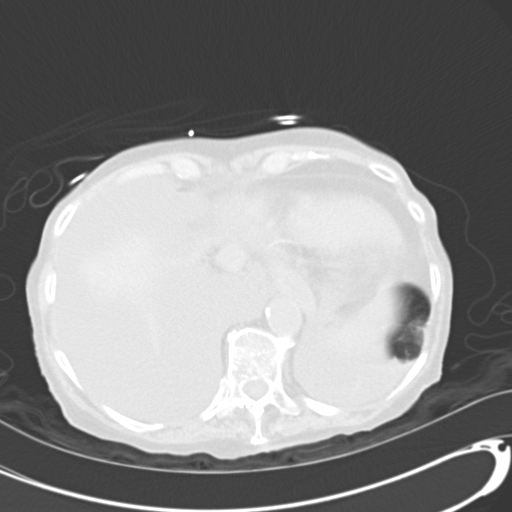
[im 18/61  lung]
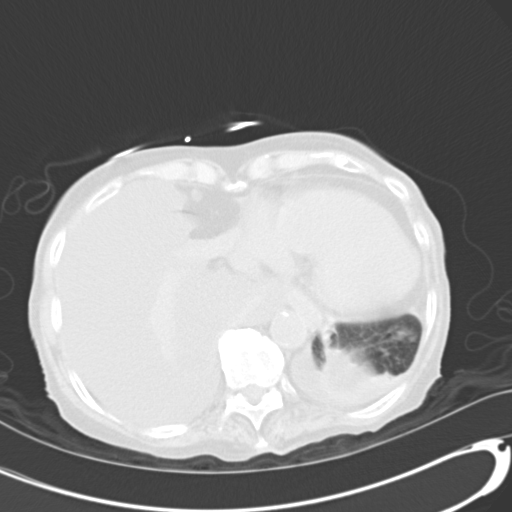
[im 25/61  mediastinal]
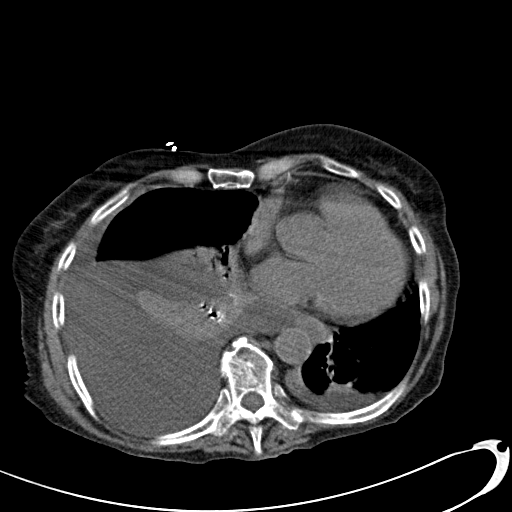
[im 25/61  lung]
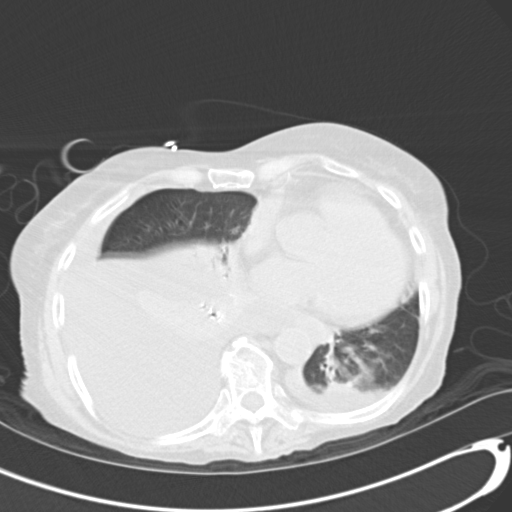
[im 29/61  lung]
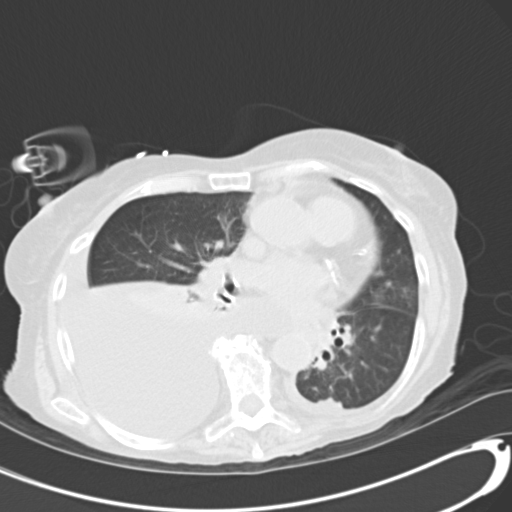
[im 32/61  lung]
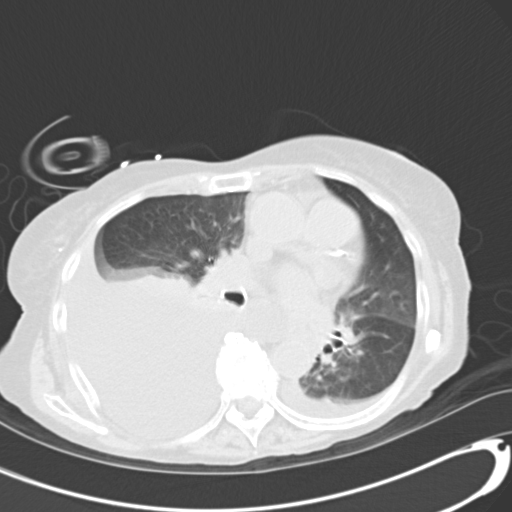
[im 39/61  lung]
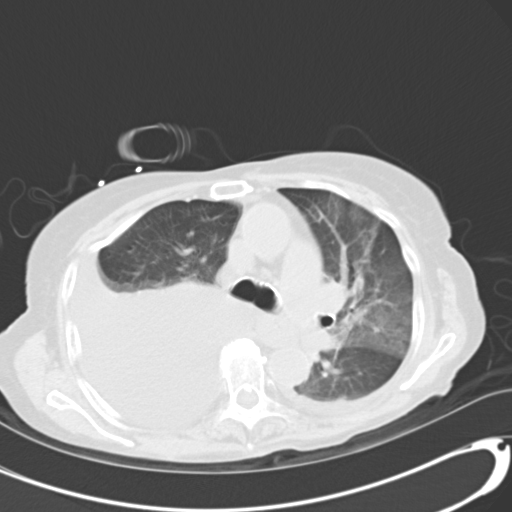
[im 43/61  mediastinal]
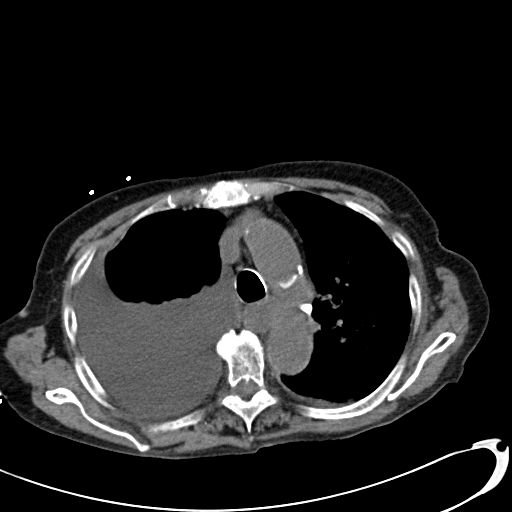
[im 43/61  lung]
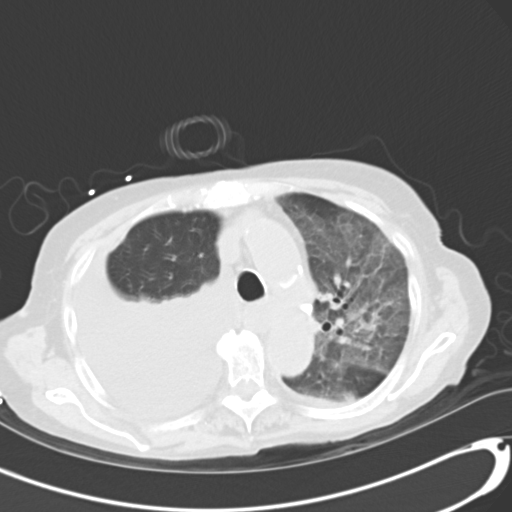
[im 46/61  lung]
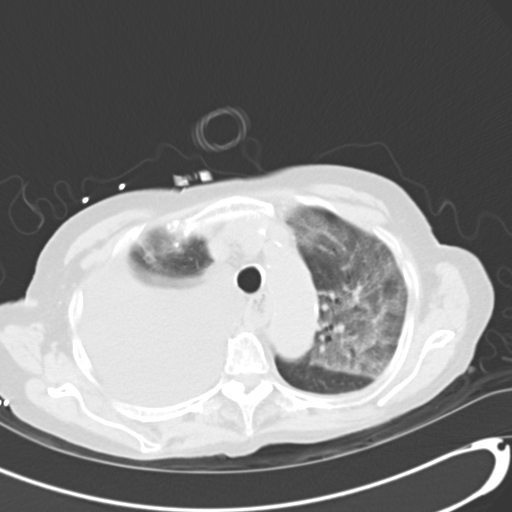
[im 53/61  lung]
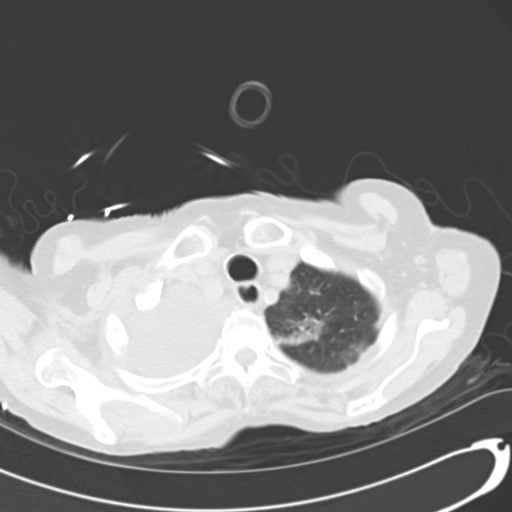
[im 57/61  lung]
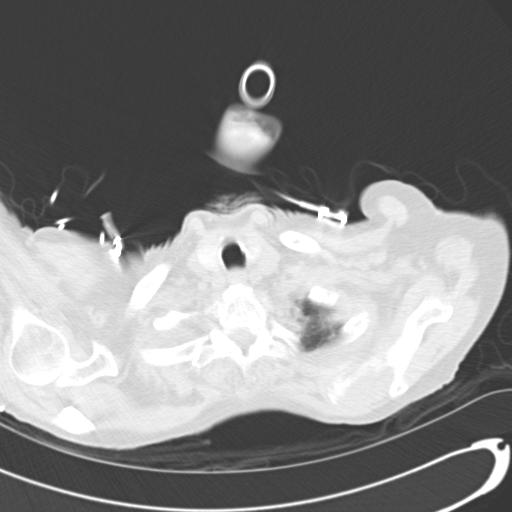

[Series 602: <mpr thick range> · coronal · 0.65mm/px · 3 of 97 slices shown]
[im 20/97  lung]
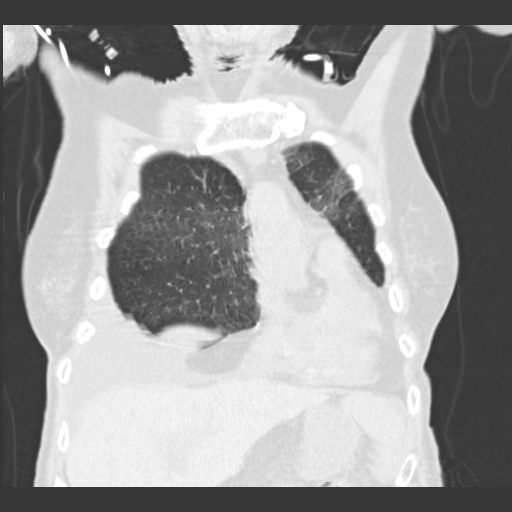
[im 39/97  lung]
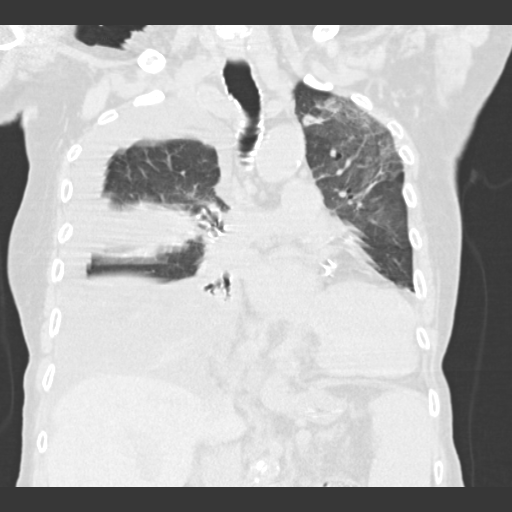
[im 58/97  lung]
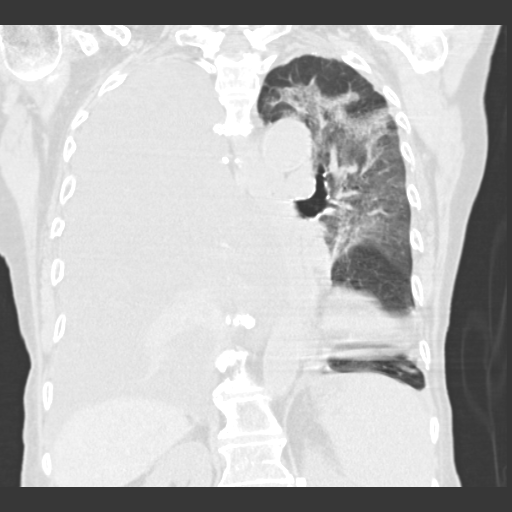

[15 of 36 positions shown; findings below may reference images not displayed]

FINDINGS: Mediastinum/Lymph Nodes: Heart size is normal. There is no
significant pericardial fluid, thickening or pericardial
calcification. There is atherosclerosis of the thoracic aorta, the
great vessels of the mediastinum and the coronary arteries,
including calcified atherosclerotic plaque in the left main, left
anterior descending, left circumflex and right coronary arteries. No
pathologically enlarged mediastinal or hilar lymph nodes. Please
note that accurate exclusion of hilar adenopathy is limited on
noncontrast CT scans. Fluid and debris throughout the esophagus. No
axillary adenopathy.

Lungs/Pleura: Assessment of the lung parenchyma is significantly
limited by a large amount of patient respiratory motion. With these
limitations in mind, there are patchy areas of ground-glass
attenuation, most evident in the left upper lobe. This is associated
with some septal thickening and focal architectural distortion.
Complete atelectasis of the right lower lobe and right middle lobe.
Subsegmental atelectasis in the dependent portions of the left lower
lobe. Large right and small left pleural effusions layering
dependently. Central airways are patent. High-resolution images are
essentially nondiagnostic.

Upper Abdomen: The spleen is incompletely visualized but appears
enlarged, measuring up to 14.6 x 5.1 cm on axial images.

Musculoskeletal/Soft Tissues: Multiple old healed right-sided rib
fractures. There are no aggressive appearing lytic or blastic
lesions noted in the visualized portions of the skeleton.
IMPRESSION: 1. Asymmetrically distributed ground-glass attenuation airspace
disease and septal thickening, involving predominantly the left
upper lobe, favored to reflect an acute pneumonia.
2. High-resolution images were very limited secondary to respiratory
motion, but there are no definite imaging findings to strongly
suggest interstitial lung disease.
3. Large right and small left pleural effusions layering dependently
with complete passive atelectasis of the right lower and middle
lobes, and mild subsegmental atelectasis of the dependent left lower
lobe.
4. Atherosclerosis, including left main and 3 vessel coronary artery
disease.
5. Splenomegaly.

## 2016-02-07 NOTE — Addendum Note (Signed)
Addendum  created 02/07/16 1603 by Jillyn Hidden, MD   Modules edited: Anesthesia Responsible Staff

## 2016-02-07 NOTE — Progress Notes (Signed)
PULMONARY / CRITICAL CARE MEDICINE   Name: Tiffany [name] MRN: [id number] DOB: 1929-01-22    ADMISSION DATE:  01/19/2016 CONSULTATION DATE:  01/27/16  REFERRING MD:  Dr. Amedeo Plenty  CHIEF COMPLAINT:  GIB, Intubated for Airway Protection  HISTORY OF PRESENT ILLNESS:  82 year old female with a past medical history of hypertension, chronic systolic heart failure ef 45%  In march 2016, atrial fibrillation, arthritis, dementia (on Namenda), seasonal allergies, Raynaud's disease, GERD, prior GIB in 02/2015 thought to be related to a Dieulafoy lesion and persistent anemia s/p colonoscopy / capsule endoscopy that were unrevealing for anemia source. She presented to [facility] on 2/17 after a near syncopal episode and vomiting up blood.  Subsequent course as follows   CULTURES:   ANTIBIOTICS: Vanco 2/17 >> off Zosyn 2/17 >> off   LINES/TUBES: ETT 2/17 >> 2/19,  R IJ TLC 2/17 >> out ............. ETT 2/25 (rpeat admit ) >>  CVL by OR 2/25 >> Aline byu OR 2/25 >    SIGNIFICANT EVENTS and studies 2/17  Admit with hematemesis  2/17 EGD >> ? esophageal varices, multiple bands -> intubated, needed 2U PRBC 2/21 - CT abd w/ large right effusion and Cirrhosis without mass.   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 Rx inderal 02/07/2016 - Readmitted - OR for endoscopy with Dr Amedeo Plenty and then to ICU and PCCM consulted. PEr report from various sources - failed banding and [place] tube placed and with reported success at 2nd attempt. Never dropped bp. Currently s/p rocuronium last 60 min agao 2/27 traction taken off blakemore. Family decided to seek full comfort.    SUBJECTIVE/OVERNIGHT/INTERVAL HX Sedated on vent   VITAL SIGNS: BP 90/38 mmHg  Pulse 78  Temp(Src) 100 F (37.8 C) (Oral)   Resp 11  Ht 5\' 1"  (1.549 m)  Wt 159 lb 9.8 oz (72.4 kg)  BMI 30.17 kg/m2  SpO2 97%   INTAKE / OUTPUT: I/O last 3 completed shifts: In: 3262.5 [I.V.:3052.5; IV [id number] Out: 31 [Urine:880]  PHYSICAL EXAMINATION: General:  Elderly female in NAD . Intubated. still critically ill Neuro:  RASS -3 on fent gtt.  HEENT: Has [place] tube to traction . Intubated Cardiovascular:  s1s2 rrr, no m/r/g Lungs:  Synch with vent. CTA bilaterally; but much decreased on right  Abdomen:  Soft, non-distented Musculoskeletal:  No acute deformities  Skin:  Warm/dry, no edema   LABS:  PULMONARY  Recent Labs Lab 01/22/2016 1330 02/06/2016 1945 02/05/16 0418  PHART 7.274* 7.313* 7.313*  PCO2ART 46.9* 39.6 37.5  PO2ART 135* 65.1* 77.1*  HCO3 21.0 19.6* 18.5*  TCO2 19.4 17.8 17.0  O2SAT 98.1 91.3 94.0    CBC  Recent Labs Lab 02/05/16 0815 02/06/16 0555 02/07/16 0400  HGB 12.1 10.6* 10.6*  HCT 37.9 34.2* 34.3*  WBC 14.4* 10.6* 10.3  PLT 134* 125* 121*    COAGULATION  Recent Labs Lab 01/25/2016 0458 01/11/2016 1420 02/05/16 0410  INR 1.37 1.27 1.28    CARDIAC    Recent Labs Lab 01/25/2016 1420 01/16/2016 2053 02/05/16 0410  TROPONINI 0.10* 1.30* 0.99*   No results for input(s): PROBNP in the last 168 hours.   CHEMISTRY  Recent Labs Lab 01/16/2016 0157 02/06/2016 0458 02/05/16 0410 02/06/16 0439 02/07/16 0400  NA 149* 145 145 145 149*  K 4.0  4.3 4.7 4.7 4.7  CL 117* 115* 119* 117* 123*  CO2 21* 21* 19* 20* 20*  GLUCOSE 159* 142* 148* 126* 120*  BUN 23* 25* 23* 29* 29*  CREATININE 1.13* 0.97 0.91 1.29* 1.16*  CALCIUM 8.9 8.0* 7.8* 8.1* 8.2*  MG  --   --  1.7 2.3 2.3  PHOS  --   --  3.3 3.8 3.5   Estimated Creatinine Clearance: 32.8 mL/min (by C-G formula based on Cr of 1.16).   LIVER  Recent Labs Lab 02/02/16 0405 02/03/2016 0157 01/12/2016 0458 01/29/2016 1420 02/05/16 0410  AST  --  22 18 22 22   ALT  --  16 14 15  13*  ALKPHOS  --  115 101 105 91   BILITOT  --  0.6 0.6 0.9 1.0  PROT 5.4* 5.9* 5.1* 6.1* 5.1*  ALBUMIN  --  3.3* 2.9* 3.3* 2.8*  INR  --   --  1.37 1.27 1.28     INFECTIOUS  Recent Labs Lab 02/03/2016 1420 02/05/16 0410  LATICACIDVEN 1.3 1.2     ENDOCRINE CBG (last 3)   Recent Labs  02/06/16 2350 02/07/16 0437 02/07/16 0753  GLUCAP 107* 112* 106*    IMAGING x48h  - image(s) personally visualized  -   highlighted in bold No results found. Complete opacification of the right hemithorax    ASSESSMENT / PLAN:   Recurrent life threatening variceal bleed - 3rd episode since march 2070m, 2nd in feb 2017 child A cirrhosis - new dx feb 2017 Acute resp failure - for GI Bleed, s/p intubation in OR recurrent hepatic hydrothorax 02/05/16-->now w/ complete opacification of the right hemithorax chronic systolic CHF (lastest EF Q000111Q) Demand ischemia  Afib   Mild AKI Mild hyperchloremic metabolic acidosis Hypernatremia  Dementia - on home Namenda and Remeron @ home  Discussion In and out of AF. BP boarderline. EF back-->45-50%. Hgb drifting but not needing xfusion yet. Right chest w/ complete opacification d/t recurrent hepato-thorax. From a symptom stand-point would be far easier to deal w/ hepatic encephalopathy and volume overload than it would to treat bleeding out. The critical care team, IR team and GI team have all explained this but the family is steadfast in their decision to go ahead and extubate and accept the risk of bleeding. They feel strongly that prolonging things in any way would be unacceptable for the patient. They understand the risk of bleeding. They ask that we do our best to keep her comfortable. They understand that w/ bleeding this has the potential to be traumatic. On a good note there has not been any blood aspirated from the blakemore since Dr Oletta Lamas took traction off the tube on 2/27.   Plan:   Cont Octreotide gtt + Protonix Gtt Remove Blakemore today  No more procedures or  labs Full DNR  Compassionate extubation today  Cont fentanyl gtt for comfort and palliation      Erick Colace ACNP-BC [place] Pager # [phone] OR # [phone] if no answer  02/07/2016 9:29 AM

## 2016-02-07 NOTE — Progress Notes (Signed)
[facility] PROGRESS NOTE Subjective family has met with all the physicians involved and has made the final decision for comfort care. The patient apparently will be extubated later today with comfort measures only. Detraction has been removed since yesterday from the [place] tube without gross bleeding. Discuss with the doctors involved in with the family and everyone is okay with removing the tube at this point  Objective: Vital signs in last 24 hours: Temp:  [98.1 F (36.7 C)-100 F (37.8 C)] 100 F (37.8 C) (02/28 0800) Pulse Rate:  [3-133] 76 (02/28 1115) Resp:  [9-19] 15 (02/28 1115) BP: (90-152)/(38-54) 152/53 mmHg (02/28 1115) SpO2:  [93 %-98 %] 97 % (02/28 1115) Arterial Line BP: (78-125)/(36-61) 110/44 mmHg (02/28 1115) FiO2 (%):  [40 %] 40 % (02/28 0742)    Intake/Output from previous day: 02/27 0701 - 02/28 0700 In: 2059.5 [I.V.:2009.5; IV Piggyback:50] Out: 625 [Urine:625] Intake/Output this shift: Total I/O In: 225 [I.V.:225] Out: 75 [Urine:75]  PE: General-- patient is heavily sedated and remains intubated on the ventilator. The air was removed from the gastric balloon on the [place] tube and the tube was easily removed. There was no gross bleeding.  Lab Results:  Recent Labs  01/28/2016 2243 02/05/16 0410 02/05/16 0815 02/06/16 0555 02/07/16 0400  WBC 20.9* 15.3* 14.4* 10.6* 10.3  HGB 13.0 12.5 12.1 10.6* 10.6*  HCT 41.0 38.9 37.9 34.2* 34.3*  PLT 167 126* 134* 125* 121*   BMET  Recent Labs  02/05/16 0410 02/06/16 0439 02/07/16 0400  NA 145 145 149*  K 4.7 4.7 4.7  CL 119* 117* 123*  CO2 19* 20* 20*  CREATININE 0.91 1.29* 1.16*   LFT  Recent Labs  01/21/2016 1420 02/05/16 0410  PROT 6.1* 5.1*  AST 22 22  ALT 15 13*  ALKPHOS 105 91  BILITOT 0.9 1.0  BILIDIR 0.2 0.1  IBILI 0.7 0.9   PT/INR  Recent Labs  01/21/2016 1420 02/05/16 0410  LABPROT 15.6* 15.6*  INR 1.27 1.28   PANCREAS No results for input(s): LIPASE in  the last 72 hours.       Studies/Results: No results found.  Medications: I have reviewed the patient's current medications.  Assessment/Plan: 1. Acute variceal hemorrhage. The decision has been made by the family to proceed with comfort measures only. The [place] tube has been removed. We will be available going forward if needed.   Tiffany [name],Cesar Alf L 02/07/2016, 11:40 AM  This note was created using voice recognition software. Minor errors may Have occurred unintentionally.  Pager: [phone] If no answer or after hours call [phone]

## 2016-02-07 NOTE — Procedures (Signed)
Extubation Procedure Note  Patient Details:   Name: Tiffany [name] DOB: 1929/09/24 MRN: [id number]   Airway Documentation:  Airway 7 mm (Active)  Secured at (cm) 20 cm 02/07/2016  7:42 AM  Measured From Lips 02/07/2016  7:42 AM  Secured Location Right 02/07/2016  7:42 AM  Secured By [employer] 02/07/2016  8:00 AM  Cuff Pressure (cm H2O) 28 cm H2O 02/07/2016  4:00 AM  Site Condition Dry 02/07/2016  8:00 AM    Evaluation  O2 sats: stable throughout Complications: No apparent complications Patient did tolerate procedure well. Bilateral Breath Sounds: Diminished Suctioning: Airway Yes   Patient extubated to 2 L [place]. Patient voice very weak, but attempted to talk. Patient family at bedside. RT available as needed.   Lamonte Sakai 02/07/2016, 12:14 PM

## 2016-02-07 NOTE — Progress Notes (Signed)
Nutrition Brief Note  Chart reviewed. - Discussed pt at rounds with Charge Nurse Pt now transitioning to comfort care. Will undergo terminal wean with removal of [place] tube via GI.  No further nutrition interventions warranted at this time.  Please re-consult as needed.   Satira Anis. Archana Eckman, MS, RD LDN After Hours/Weekend Pager [phone]

## 2016-02-07 NOTE — Patient Outreach (Signed)
[facility]) Care Management  02/07/2016  Tiffany [name] 1929-01-09 [id number]   Assessment: Transition of care- week 2 cancelled due to patient's hospitalization Transition of care call scheduled today was cancelled due to patient's readmission to the hospital (01/23/2016) for gastrointestinal bleed/ hematemesis.  Plan: Will follow-up with patient after hospital discharge for transition of care. Care coordination with hospital liaison of patient's update/ status.  Jaymin Waln A. Larita Deremer, BSN, RN-BC [place] Management Coordinator Cell: [phone]

## 2016-02-08 DIAGNOSIS — Z66 Do not resuscitate: Secondary | ICD-10-CM

## 2016-02-08 DIAGNOSIS — Z515 Encounter for palliative care: Secondary | ICD-10-CM

## 2016-02-08 LAB — TYPE AND SCREEN
ABO/RH(D): B NEG
Antibody Screen: NEGATIVE
UNIT DIVISION: 0
UNIT DIVISION: 0
UNIT DIVISION: 0
Unit division: 0

## 2016-02-08 MED ORDER — ATROPINE SULFATE 1 % OP SOLN
1.0000 [drp] | Freq: Four times a day (QID) | OPHTHALMIC | Status: DC
Start: 2016-02-08 — End: 2016-02-08
  Administered 2016-02-08: 1 [drp] via SUBLINGUAL
  Filled 2016-02-08: qty 2

## 2016-02-08 MED ORDER — ATROPINE SULFATE 1 % OP SOLN
4.0000 [drp] | Freq: Four times a day (QID) | OPHTHALMIC | Status: DC
  Administered 2016-02-08 – 2016-02-09 (×4): 4 [drp] via SUBLINGUAL

## 2016-02-08 NOTE — Progress Notes (Signed)
Date:  February 08, 2016 Chart reviewed for concurrent status and case management needs. Will continue to follow patient for changes and needs:  Patient extubated on 03/07/2016/moved to comfort care and medical floor Velva Harman, BSN, [place], [place]   [phone]

## 2016-02-08 NOTE — Progress Notes (Signed)
PULMONARY / CRITICAL CARE MEDICINE   Name: Tiffany [name] MRN: [id number] DOB: Apr 22, 1929    ADMISSION DATE:  02/01/2016 CONSULTATION DATE:  01/27/16  REFERRING MD:  Dr. Amedeo Plenty  CHIEF COMPLAINT:  GIB, Intubated for Airway Protection  HISTORY OF PRESENT ILLNESS:  82 year old female with a past medical history of hypertension, chronic systolic heart failure ef 45%  In march 2016, atrial fibrillation, arthritis, dementia (on Namenda), seasonal allergies, Raynaud's disease, GERD, prior GIB in 02/2015 thought to be related to a Dieulafoy lesion and persistent anemia s/p colonoscopy / capsule endoscopy that were unrevealing for anemia source. She presented to [facility] on 2/17 after a near syncopal episode and vomiting up blood.  Subsequent course as follows   CULTURES:   ANTIBIOTICS: Vanco 2/17 >> off Zosyn 2/17 >> off   LINES/TUBES: ETT 2/17 >> 2/19,  R IJ TLC 2/17 >> out ............. ETT 2/25 (rpeat admit ) >>  CVL by OR 2/25 >> Aline byu OR 2/25 >    SIGNIFICANT EVENTS and studies 2/17  Admit with hematemesis  2/17 EGD >> ? esophageal varices, multiple bands -> intubated, needed 2U PRBC 2/21 - CT abd w/ large right effusion and Cirrhosis without mass.   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 Rx inderal 01/30/2016 - Readmitted - OR for endoscopy with Dr Amedeo Plenty and then to ICU and PCCM consulted. PEr report from various sources - failed banding and [place] tube placed and with reported success at 2nd attempt. Never dropped bp. Currently s/p rocuronium last 60 min agao 2/27 traction taken off [place]. Family decided to seek full comfort.  2/28 extubated; fent gtt for comfort 3/1 moved to med/surg-->ongoing palliative care   SUBJECTIVE/OVERNIGHT/INTERVAL HX Sedated  on vent   VITAL SIGNS: BP 132/53 mmHg  Pulse 111  Temp(Src) 100 F (37.8 C) (Oral)  Resp 15  Ht 5\' 1"  (1.549 m)  Wt 159 lb 9.8 oz (72.4 kg)  BMI 30.17 kg/m2  SpO2 90%   INTAKE / OUTPUT: I/O last 3 completed shifts: In: 2122.9 [I.V.:2072.9; IV Piggyback:50] Out: 740 [Urine:740]  PHYSICAL EXAMINATION: General:  Elderly female unresponsive on fent gtt for comfort Neuro:  RASS -3 on fent gtt.  HEENT: tubes removed. MM dry, thick foul smelling oral secretions removed from mouth Cardiovascular:  s1s2 rrr, no m/r/g Lungs: short, rhonchus respirations Abdomen:  Soft, non-distented Musculoskeletal:  No acute deformities  Skin:  Warm/dry, no edema   ASSESSMENT / PLAN:   Recurrent life threatening variceal bleed - 3rd episode since march 2069m, 2nd in feb 2017 child A cirrhosis - new dx feb 2017 Acute resp failure - for GI Bleed, s/p intubation in OR recurrent hepatic hydrothorax 02/05/16-->now w/ complete opacification of the right hemithorax chronic systolic CHF (lastest EF Q000111Q) Demand ischemia  Afib   Mild AKI Mild hyperchloremic metabolic acidosis Hypernatremia  Dementia - on home Namenda and Remeron @ home  Discussion S/p compassionate extubation on 3/1. Family at bedside and all questions answered. Appears comfortable on fentanyl infusion. Resps shallow, appears to be actively dying.   Plan:   Cont Octreotide gtt + Protonix Gtt Atropine gtts for secretions  No more procedures or labs Full DNR  Cont fentanyl gtt for comfort and palliation  Move to palliative care unit.    Erick Colace ACNP-BC  [place] Pager # [phone] OR # [phone] if no answer  02/20/2016 8:32 AM

## 2016-02-08 NOTE — Progress Notes (Signed)
Patient transferred from ICU. Patient is a comfort care. Fentanyl drip ongoing. Has shallow breathing, PR tachy. Family at bedside. Will continue to monitor.

## 2016-02-08 DEATH — deceased

## 2016-02-09 IMAGING — DX DG CHEST 1V PORT
1 series · 1 of 1 positions shown · non-contrast
Comparison: 03/09/2015.  03/09/2015.  CT 03/08/2015.

CLINICAL DATA: Pleural effusion.

EXAM:
PORTABLE CHEST - 1 VIEW

[chest ap]
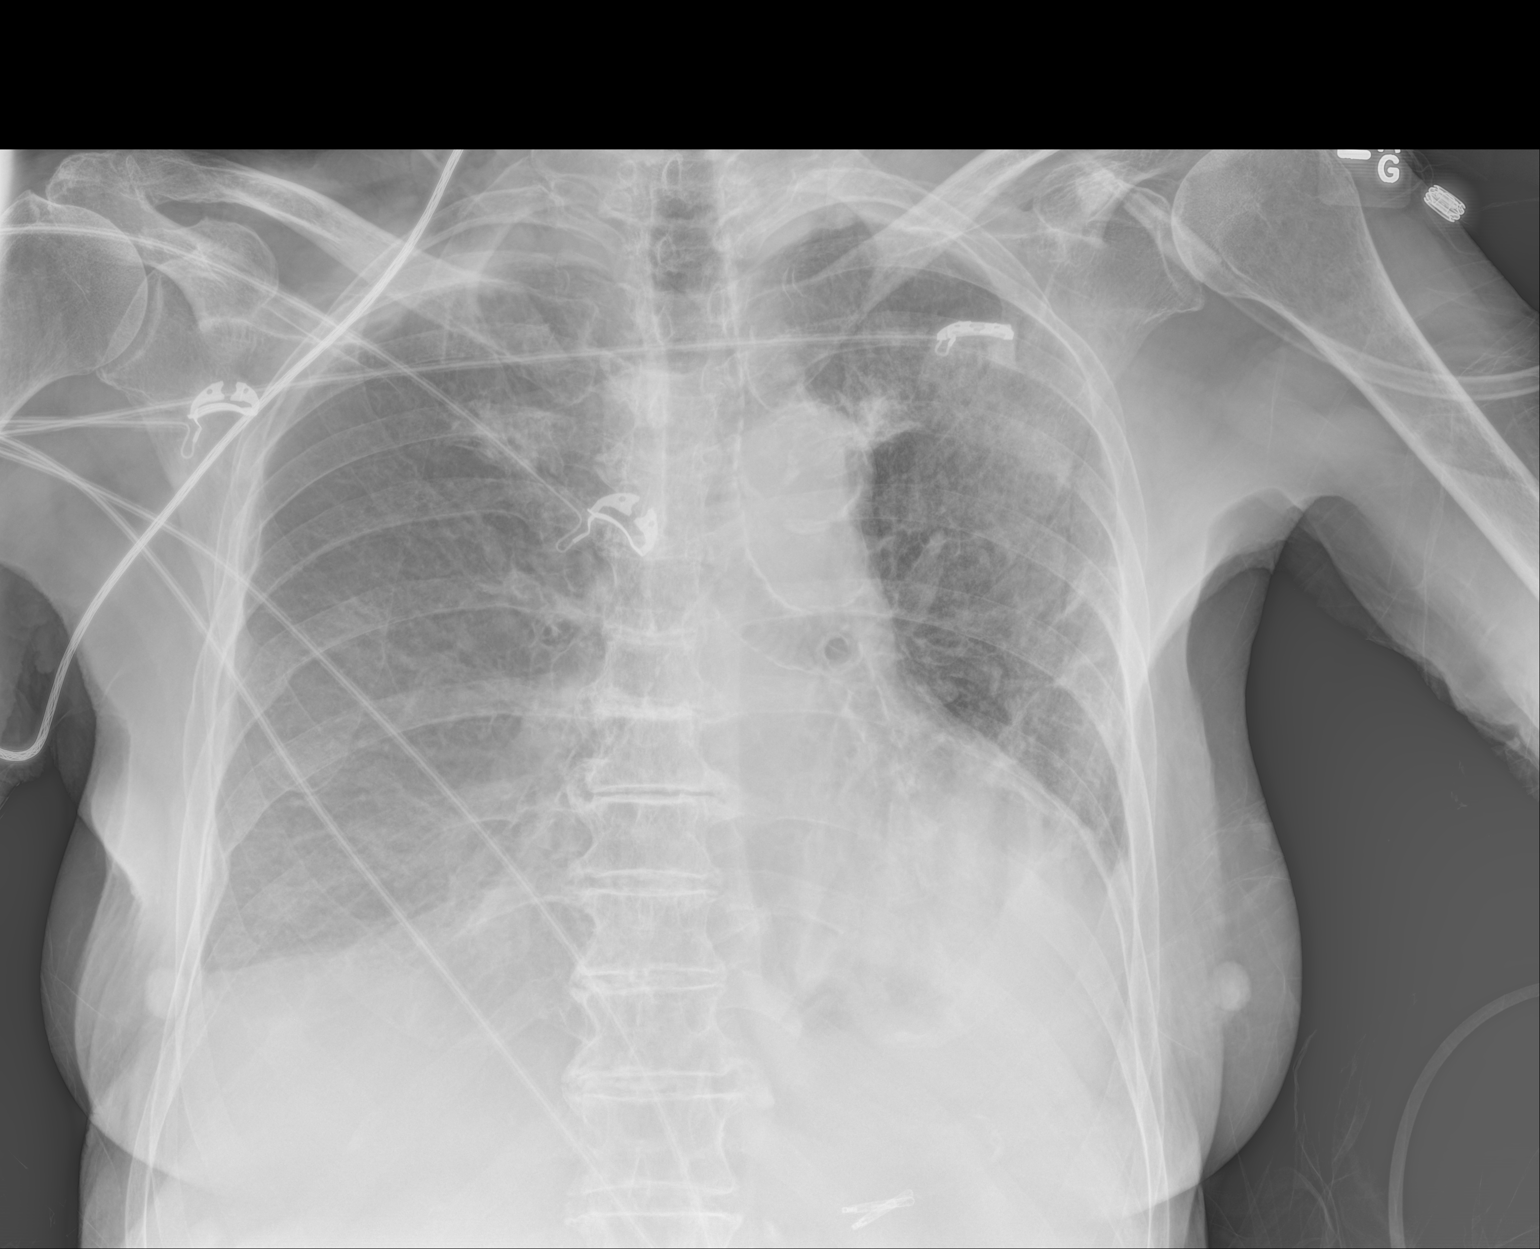

[1 of 1 positions shown; findings below may reference images not displayed]

FINDINGS: Mediastinum and hilar structures normal. Stable cardiomegaly. Stable
diffuse pulmonary interstitial prominence. Bibasilar atelectasis.
Mild left lower lobe infiltrate cannot be excluded on today's exam.
Continued improvement of right pleural effusion. Small left pleural
effusion. No pneumothorax. Old right rib fractures noted.
IMPRESSION: 1. Continued improvement right pleural effusion.
2. Cardiomegaly with persistent mild pulmonary interstitial
prominence. Mild component congestive heart failure may be present.
3. Bibasilar atelectasis. Left lower lobe infiltrate cannot be
excluded on today's exam . Small left pleural effusion cannot be
excluded .

## 2016-02-09 MED ORDER — SCOPOLAMINE 1 MG/3DAYS TD PT72: 1.0000 | MEDICATED_PATCH | TRANSDERMAL | Status: DC

## 2016-02-10 ENCOUNTER — Telehealth: Payer: Self-pay

## 2016-02-10 NOTE — Telephone Encounter (Signed)
On 02/10/2016 I received a death certificate from [place] (original). The death certificate is for cremation. The patient is a patient of Doctor Ramaswamy. The death certificate will be taken to Pulmonary Unit for signature this pm. On 26-Feb-2016 I received the death certificate back from [place]. I got the death certificate ready and called the funeral home to let them know the death certificate is ready for pickup. I also faxed them a copy per their request.

## 2016-03-10 NOTE — Progress Notes (Signed)
Pt had uneventful night. Occasional moans. Currently at 390mcg/hr fentanyl. Family stayed at bedside all night. Very supportive.

## 2016-03-10 NOTE — Discharge Summary (Signed)
DISCHARGE SUMMARY    Date of admit: 01/24/2016  1:38 AM Date of discharge: 02/28/2016 11:36 AM Length of Stay: 5 days  PCP is Kennyth Arnold, FNP   PROBLEM LIST Principal Problem:   GIB (gastrointestinal bleeding)  - variceal due to cryptogenic cirrhosis Active Problems:   Acute respiratory failure (HCC)   Pleural effusion   ILD (interstitial lung disease) ([place])   Alzheimer's dementia   Essential hypertension   Esophageal reflux   Acute blood loss anemia   Cirrhosis (HCC)   Esophageal varices in cirrhosis (HCC)   Chronic combined systolic and diastolic CHF (congestive heart failure) (HCC)   Hematemesis   Leukocytosis   A-fib (HCC)   Hypernatremia     DNAR (do not attempt resuscitation)   Palliative care status   Terminal care   DNR (do not resuscitate)    SUMMARY Tiffany [name] was 82 y.o. patient with    has a past medical history of Hypertension; Arthritis; Dementia; Allergic rhinitis; GI bleed; Raynaud's disease; GERD (gastroesophageal reflux disease); Acute respiratory failure ([place]); Pleural effusion; ILD (interstitial lung disease) ([place]); Pulmonary edema; Chronic diastolic (congestive) heart failure ([place]); Chronic systolic heart failure ([place]); A-fib ([place]); and Cirrhosis of liver not due to alcohol ([place]).   has past surgical history that includes Back surgery (09/25/2011); Abdominal hysterectomy; Esophagogastroduodenoscopy (N/A, 03/01/2015); Esophagogastroduodenoscopy (N/A, 03/02/2015); Esophagogastroduodenoscopy (egd) with propofol (N/A, 06/25/2015); Colonoscopy with propofol (N/A, 06/27/2015); Givens capsule study (N/A, 06/27/2015); and Esophagogastroduodenoscopy (N/A, 01/27/2016).   Admitted on 01/16/2016 with life threatening recurrent variceal bleed that could not be banded and needed emergent [place] tube. TIPS was offered but she has chronic systolic chf and given risk of encephalopathy and also using substituted judgement and best interest judgement family  decided to terminal wean patient 02-26-16. She then died peacefully without active gi bleed on 02-28-16   SIGNED Dr. Brand Males, M.D., F.C.C.P Pulmonary and Critical Care Medicine Staff Physician [place] [facility] Pager: [phone], If no answer or between  15:00h - 7:00h: call [phone]  02/10/2016 11:20 AM

## 2016-03-10 NOTE — Progress Notes (Signed)
PULMONARY / CRITICAL CARE MEDICINE   Name: Tiffany [name] MRN: [id number] DOB: 1929-10-25    ADMISSION DATE:  01/26/2016 CONSULTATION DATE:  01/27/16  REFERRING MD:  Dr. Amedeo Plenty  CHIEF COMPLAINT:  GIB, Intubated for Airway Protection  HISTORY OF PRESENT ILLNESS:  82 year old female with a past medical history of hypertension, chronic systolic [name] failure ef 45%  In 03/29/16atrial fibrillation, arthritis, dementia (on Namenda), seasonal allergies, Raynaud's disease, GERD, prior GIB in 03-08-15 thought to be related to a Dieulafoy lesion and persistent anemia s/p colonoscopy / capsule endoscopy that were unrevealing for anemia source. She presented to [facility] on 2/17 after a near syncopal episode and vomiting up blood.  Subsequent course as follows   CULTURES:   ANTIBIOTICS: Vanco 2/17 >> off Zosyn 2/17 >> off   LINES/TUBES: ETT 2/17 >> 2/19,  R IJ TLC 2/17 >> out ............. ETT 2/25 (rpeat admit ) >>  CVL by OR 2/25 >> Aline byu OR 2/25 >    SIGNIFICANT EVENTS and studies 2/17  Admit with hematemesis  2/17 EGD >> ? esophageal varices, multiple bands -> intubated, needed 2U PRBC 2/21 - CT abd w/ large right effusion and Cirrhosis without mass.   - PCCM reconsult for effusion (pl;eural) ->transudate and thought due to fluid resus + diast chf (similar to 2015-03-08) 2/23- pccm sign off 2/23 - discharged home with NEW Diagnosis iof Cryptogenic Cirrhosis given Child Class A (no ascites) severity class. Rx inderal 01/27/2016 - Readmitted - OR for endoscopy with Dr Amedeo Plenty and then to ICU and PCCM consulted. PEr report from various sources - failed banding and [place] tube placed and with reported success at 2nd attempt. Never dropped bp. Currently s/p rocuronium last 60 min agao 2/27 traction taken off [place]. Family decided to seek full comfort.  2/28 extubated; fent gtt for comfort 3/1 moved to med/surg-->ongoing palliative care   SUBJECTIVE/OVERNIGHT/INTERVAL HX 03-07-2016  - death rattle not as prominent. Oliguric now. On fent gtt. Unrespnsive. Family at bedside all night. Comfortable per family VITAL SIGNS: BP 119/46 mmHg  Pulse 112  Temp(Src) 98.8 F (37.1 C) (Axillary)  Resp 9  Ht 5\' 1"  (1.549 m)  Wt 72.4 kg (159 lb 9.8 oz)  BMI 30.17 kg/m2  SpO2 72%   INTAKE / OUTPUT: I/O last 3 completed shifts: In: 665.5 [I.V.:665.5] Out: 335 [Urine:335]    Intake/Output Summary (Last 24 hours) at 03/07/16 0801 Last data filed at 02/13/2016 1356  Gross per 24 hour  Intake  315.5 ml  Output      0 ml  Net  315.5 ml    Intake/Output      03/01 0701 - 03/08/2023 0700 03/08/2023 0701 - 03/03 0700   I.V. (mL/kg) 315.5 (4.4)    Total Intake(mL/kg) 315.5 (4.4)    Urine (mL/kg/hr)     Total Output       Net +315.5             PHYSICAL EXAMINATION: General:  Elderly female unresponsive on fent gtt for comfort Neuro:  RASS -4 on fent gtt.  HEENT: nasolablial folds relaxed Cardiovascular:  s1s2 rrr, no m/r/g Lungs: short, rhonchus respirations Abdomen:  Soft, non-distented Musculoskeletal:  No acute deformities  Skin:  Warm/dry, no edema   PROB Patient Active Problem List   Diagnosis Date Noted  . Terminal care 03/01/2016  . DNR (do not resuscitate) 03/02/2016  . DNAR (do not attempt resuscitation) 02/07/2016  . Palliative care status 02/07/2016  . Chronic combined systolic  and diastolic CHF (congestive [name] failure) ([place]) 01/22/2016  . Hematemesis 01/15/2016  . Leukocytosis 02/05/2016  . A-fib ([place]) 01/30/2016  . Hypernatremia 01/13/2016  . SOB (shortness of breath)   . Esophageal varices in cirrhosis ([place]) 02/02/2016  . Acute blood loss anemia 01/31/2016  . Cirrhosis ([place])   . GIB (gastrointestinal bleeding) 01/27/2016  . GI bleed 08/26/2015  . Essential hypertension   . Chronic diastolic [name] failure ([place])   . Esophageal reflux   . Dieulafoy lesion of stomach 06/23/2015  . Alzheimer's dementia 05/12/2015  . Systolic CHF with reduced left  ventricular function, NYHA class 3 ([place]) 03/31/2015  . ILD (interstitial lung disease) ([place])   . Dyspnea   . Pleural effusion   . Atrial fibrillation with rapid ventricular response ([place]) 03/06/2015  . Acute respiratory failure ([place]) 03/01/2015  . Anxiety state, unspecified 01/19/2013  . Allergic rhinitis 01/19/2013  . Osteoarthrosis, unspecified whether generalized or localized, involving lower leg 01/19/2013  . Other and unspecified hyperlipidemia 01/19/2013  . Raynauds syndrome 01/19/2013     ASSESSMENT / PLAN:   Recurrent life threatening variceal bleed - 3rd episode since march 2067m, 2nd in feb 2017 child A cirrhosis - new dx feb 2017 Acute resp failure - for GI Bleed, s/p intubation in OR recurrent hepatic hydrothorax 02/05/16-->now w/ complete opacification of the right hemithorax chronic systolic CHF (lastest EF Q000111Q) Demand ischemia  Afib   Mild AKI Mild hyperchloremic metabolic acidosis Hypernatremia  Dementia - on home Namenda and Remeron @ home  Discussion S/p compassionate extubation on 3/1. Family at bedside and all questions answered. Appears comfortable on fentanyl infusion. Resps shallow, appears to be actively dying.   Plan:   Dc t Octreotide gtt Continue Protonix BID contineu fent gtt Atropine gtts for secretions  No more procedures or labs Full DNR  Cont fentanyl gtt for comfort and palliation  .  Prognosis - less than few days given anuria    Dr. Brand Males, M.D., [facility].P Pulmonary and Critical Care Medicine Staff Physician [place] [facility] Pager: [phone], If no answer or between  15:00h - 7:00h: call [phone]  02-11-16 9:41 AM

## 2016-03-10 NOTE — Progress Notes (Signed)
Called to the room by daughter to check patient, that she was not breathing.  Walked into the room and listened to her and she was not breathing.  There was no heart beat either.  Patient was pronounced with Tiffany [name] at 1125.

## 2016-03-10 DEATH — deceased

## 2016-08-20 ENCOUNTER — Ambulatory Visit: Payer: PPO | Admitting: Neurology

## 2017-01-08 ENCOUNTER — Other Ambulatory Visit: Payer: Self-pay | Admitting: *Deleted
